# Patient Record
Sex: Male | Born: 1955 | Race: White | Hispanic: No | Marital: Married | Smoking: Former smoker
Health system: Southern US, Community
[De-identification: ages and names within clinical notes are randomized; demographics above are authoritative.]

## PROBLEM LIST (undated history)

## (undated) DIAGNOSIS — G118 Other hereditary ataxias: Secondary | ICD-10-CM

## (undated) DIAGNOSIS — M810 Age-related osteoporosis without current pathological fracture: Secondary | ICD-10-CM

## (undated) DIAGNOSIS — B3781 Candidal esophagitis: Secondary | ICD-10-CM

## (undated) DIAGNOSIS — J189 Pneumonia, unspecified organism: Secondary | ICD-10-CM

## (undated) DIAGNOSIS — N500 Atrophy of testis: Secondary | ICD-10-CM

## (undated) DIAGNOSIS — R1314 Dysphagia, pharyngoesophageal phase: Secondary | ICD-10-CM

## (undated) DIAGNOSIS — R471 Dysarthria and anarthria: Principal | ICD-10-CM

## (undated) DIAGNOSIS — R930 Abnormal findings on diagnostic imaging of skull and head, not elsewhere classified: Secondary | ICD-10-CM

## (undated) DIAGNOSIS — R269 Unspecified abnormalities of gait and mobility: Secondary | ICD-10-CM

## (undated) DIAGNOSIS — I7300 Raynaud's syndrome without gangrene: Secondary | ICD-10-CM

## (undated) DIAGNOSIS — J449 Chronic obstructive pulmonary disease, unspecified: Secondary | ICD-10-CM

## (undated) HISTORY — DX: Age-related osteoporosis without current pathological fracture: M81.0

## (undated) HISTORY — PX: CATARACT EXTRACTION: SUR2

## (undated) HISTORY — DX: Chronic obstructive pulmonary disease, unspecified: J44.9

## (undated) HISTORY — PX: INGUINAL HERNIA REPAIR: SUR1180

## (undated) HISTORY — DX: Atrophy of testis: N50.0

## (undated) HISTORY — DX: Dysarthria and anarthria: R47.1

## (undated) HISTORY — DX: Unspecified abnormalities of gait and mobility: R26.9

## (undated) HISTORY — DX: Abnormal findings on diagnostic imaging of skull and head, not elsewhere classified: R93.0

## (undated) HISTORY — PX: VASECTOMY: SHX75

## (undated) HISTORY — DX: Other hereditary ataxias: G11.8

## (undated) HISTORY — DX: Pneumonia, unspecified organism: J18.9

## (undated) HISTORY — DX: Dysphagia, pharyngoesophageal phase: R13.14

## (undated) HISTORY — PX: NASAL FRACTURE SURGERY: SHX718

## (undated) HISTORY — DX: Raynaud's syndrome without gangrene: I73.00

## (undated) HISTORY — DX: Candidal esophagitis: B37.81

---

## 2001-01-25 ENCOUNTER — Encounter: Admission: RE | Admit: 2001-01-25 | Discharge: 2001-01-25 | Payer: Self-pay | Admitting: Internal Medicine

## 2003-05-12 ENCOUNTER — Encounter: Admission: RE | Admit: 2003-05-12 | Discharge: 2003-05-12 | Payer: Self-pay | Admitting: Urology

## 2003-05-15 ENCOUNTER — Ambulatory Visit (HOSPITAL_BASED_OUTPATIENT_CLINIC_OR_DEPARTMENT_OTHER): Admission: RE | Admit: 2003-05-15 | Discharge: 2003-05-15 | Payer: Self-pay | Admitting: Urology

## 2003-05-15 ENCOUNTER — Ambulatory Visit (HOSPITAL_COMMUNITY): Admission: RE | Admit: 2003-05-15 | Discharge: 2003-05-15 | Payer: Self-pay | Admitting: Urology

## 2003-05-15 ENCOUNTER — Encounter (INDEPENDENT_AMBULATORY_CARE_PROVIDER_SITE_OTHER): Payer: Self-pay | Admitting: Specialist

## 2006-12-07 HISTORY — PX: COLONOSCOPY W/ POLYPECTOMY: SHX1380

## 2007-09-08 ENCOUNTER — Emergency Department (HOSPITAL_COMMUNITY): Admission: EM | Admit: 2007-09-08 | Discharge: 2007-09-08 | Payer: Self-pay | Admitting: Emergency Medicine

## 2009-04-10 DIAGNOSIS — M810 Age-related osteoporosis without current pathological fracture: Secondary | ICD-10-CM

## 2009-04-10 HISTORY — DX: Age-related osteoporosis without current pathological fracture: M81.0

## 2010-03-29 ENCOUNTER — Emergency Department (HOSPITAL_COMMUNITY)
Admission: EM | Admit: 2010-03-29 | Discharge: 2010-03-29 | Payer: Self-pay | Source: Home / Self Care | Admitting: Emergency Medicine

## 2010-08-26 NOTE — Op Note (Signed)
NAME:  Warren [name], Warren [name]                               ACCOUNT NO.:  [id number]   MEDICAL RECORD NO.:  [id number]                   PATIENT TYPE:  AMB   LOCATION:  [facility]                                 FACILITY:  [facility]   PHYSICIAN:  Lindaann Slough, M.D.               DATE OF BIRTH:  1955/04/30   DATE OF PROCEDURE:  05/15/2003  DATE OF DISCHARGE:                                 OPERATIVE REPORT   PREOPERATIVE DIAGNOSES:  Elective sterilization.   POSTOPERATIVE DIAGNOSES:  Elective sterilization.   PROCEDURE:  Bilateral vasectomy.   SURGEON:  Lindaann Slough, M.D. and [name], M.D.   ANESTHESIA:  General.   INDICATIONS FOR PROCEDURE:  The patient is a 55 year old male who was found  on examination by Dr. Lurene Shadow to have bilateral inguinal hernias.  The  patient would like also to have a vasectomy.  He has one child and he feels  that his family is complete.  He was interviewed with his wife and they both  agreed.  He is scheduled today for bilateral inguinal hernia repair by Dr.  Lurene Shadow and bilateral vasectomy.   A right inguinal incision was made by Dr. Lurene Shadow to do the hernia repair and  for specifics see his portion of the dictation.  After the hernia repair was  completed, the vas on the right side was dissected from the vas sheath and a  segment of the vas was excised.  Each end of the vas was then fulgurated and  each end was turned on itself and doubly ligated with #0 Vicryl. The  proximal end of the vas was then covered with the vas sheath.  The incision  was then closed by Dr. Lurene Shadow.   The same procedure was done on the left side.   The patient tolerated the procedure well.                                               Lindaann Slough, M.D.    MN/MEDQ  D:  05/15/2003  T:  05/16/2003  Job:  [id number]   cc:   [name], M.D.  [address]  [address]  [place]  [place] [address]  Fax: [phone]

## 2010-08-26 NOTE — Op Note (Signed)
NAME:  Warren [name], Warren [name]                               ACCOUNT NO.:  [id number]   MEDICAL RECORD NO.:  [id number]                   PATIENT TYPE:  AMB   LOCATION:  [facility]                                 FACILITY:  [facility]   PHYSICIAN:  [name], M.D.                DATE OF BIRTH:  04-25-1955   DATE OF PROCEDURE:  05/16/2003  DATE OF DISCHARGE:                                 OPERATIVE REPORT   PREOPERATIVE DIAGNOSIS:  Bilateral inguinal hernias.   POSTOPERATIVE DIAGNOSIS:  Bilateral inguinal hernias.   PROCEDURE:  Bilateral inguinal hernia repaired with mesh.   SURGEON:  Mardene Celeste. Lurene Shadow, M.D.   CONSULTANT:  Dr. Brunilda Payor.   ANESTHESIA:  General.   INDICATIONS FOR PROCEDURE:  Note, Warren [name] is a 55 year old man, who had a  subdiagnosis of a right inguinal hernia.  On evaluation, noted to have  bilateral inguinal hernias at the time of preparation for operation.  He  also indicated that he wished elective sterilization by vasectomy.  Subsequently consulted on by Dr. Brunilda Payor, who consented to do this procedure at  the same time as his bilateral inguinal hernias.   DESCRIPTION OF PROCEDURE:  Following the induction of satisfactory general  anesthesia with the patient positioned supinely, the lower abdomen is  prepped with Hibiclens solution due to the patient's previously noted IODINE  allergy.  A symmetrical transverse incision in the lower abdominal crease  were outlined with blue ink.  I began on the right side, infiltrating the  subcutaneous tissue with 0.5% Marcaine with epinephrine.  A transverse  incision is made in the lower abdominal crease, deepened through the skin  and subcutaneous tissue and carried down to the external oblique  aponeurosis.  The external oblique aponeurosis was opened up through the  external inguinal ring with protection of the ilioinguinal nerve.  The  spermatic cord was elevated and held with a Penrose drain.  Dissection along  the anterior and medial  aspect of the spermatic cord did not reveal any  evidence of an indirect sac.  There was, however, a fairly large direct  inguinal hernia which was dissected free and then repaired with an onlay  patch of polypropylene mesh which was sewn in from the pubic tubercle with 2-  0 Novofil suture up along the conjoined tendon to the internal ring and  again from the pubic tubercle up along the shelving edge of Cooper's  ligament to the internal ring.  The mesh was split so as to allow the cord  to pass through it.  The tails of the mesh were then trimmed and sutured  down behind the spermatic cord.  At that point, Dr. Brunilda Payor went ahead and  performed a vasectomy on the right side which will be dictated in a separate  operative note.  The spermatic cord was then returned to its normal anatomic  position.  The external  oblique aponeurosis closed over the cord with a  running 2-0 Vicryl suture.  Scarpa's fascia closed with a running 3-0 Vicryl  suture and the skin closed with a running 4-0 Monocryl suture.   Attention was then turned to the left side where a symmetrically placed  lower groin incision was made, deepened, through the skin and subcutaneous  tissues, carried down to the external oblique aponeurosis.  The external  oblique aponeurosis was opened down through the external inguinal ring with  protection of the ilioinguinal nerve. Spermatic cord was again elevated,  held with a Penrose drain.  There was no evidence of an indirect hernia with  dissection of the spermatic cord.  There was a somewhat smaller but definite  left-sided inguinal hernia repaired with an onlay patch of polypropylene  mesh, sewn in with 2-0 Novofil suture and carried up below the conjoined  tendons at the internal ring and again from pubic tubercle up to the  internal ring, sewing the mesh to the shelving edge of Cooper's ligament.  The mesh was appropriately split so as to allow the protrusion of the cord  between  the leaflets of the mesh.  The mesh was then trimmed and sutured  down behind the spermatic cord.  Dr. Brunilda Payor performed a vasectomy on the left  side as well.  The external oblique aponeurosis was closed with a running 2-  0 Vicryl suture.  Scarpa's fascia was closed with a running 3-0 Vicryl  suture, and the skin was closed with a 4-0 Monocryl suture.  Sponge,  instrument, and sharp counts were verified, and the wound got reinforced  with Steri-Strips, and sterile dressings were applied.  The anesthetic was  reversed, the patient removed from the operating room to the recovery room  in stable condition.  He tolerated the procedure well.                                               [name], M.D.    PB/MEDQ  D:  05/16/2003  T:  05/16/2003  Job:  [id number]   cc:   Lindaann Slough, M.D.  [address]  [place]  [place] [address]  Fax: [phone]   Dr. Lurene Shadow (2 copies)

## 2010-12-20 ENCOUNTER — Encounter: Payer: Self-pay | Admitting: Emergency Medicine

## 2010-12-21 ENCOUNTER — Ambulatory Visit (INDEPENDENT_AMBULATORY_CARE_PROVIDER_SITE_OTHER): Payer: BC Managed Care – PPO | Admitting: Emergency Medicine

## 2010-12-21 ENCOUNTER — Encounter: Payer: Self-pay | Admitting: Emergency Medicine

## 2010-12-21 ENCOUNTER — Ambulatory Visit (INDEPENDENT_AMBULATORY_CARE_PROVIDER_SITE_OTHER)
Admission: RE | Admit: 2010-12-21 | Discharge: 2010-12-21 | Disposition: A | Discharge status: Home / Self Care | Payer: BC Managed Care – PPO | Source: Ambulatory Visit | Attending: Emergency Medicine | Admitting: Emergency Medicine

## 2010-12-21 DIAGNOSIS — F17200 Nicotine dependence, unspecified, uncomplicated: Secondary | ICD-10-CM

## 2010-12-21 DIAGNOSIS — J449 Chronic obstructive pulmonary disease, unspecified: Secondary | ICD-10-CM

## 2010-12-21 DIAGNOSIS — Z720 Tobacco use: Secondary | ICD-10-CM | POA: Insufficient documentation

## 2010-12-21 NOTE — Assessment & Plan Note (Addendum)
-   start symbicort - full pft - continue spiriva - prn SABA - stop qvar - CXR today - walking oximetry - discussed tobacco cessation in detail

## 2010-12-21 NOTE — Patient Instructions (Signed)
Please continue you Spiriva Use ProAir 2 puffs as needed Stop QVAR Start Symbicort 2 puffs twice a day (on a schedule) CXR today We will perform full breathing tests at your next visit We talked about stopping smoking - we will discuss a possible quit date next visit Walking oximetry today Follow up with Dr Delton Coombes in 1 month with PFT

## 2010-12-21 NOTE — Progress Notes (Signed)
Subjective:    Patient ID: Warren [name], male    DOB: Feb 28, 1956, 55 y.o.   MRN: [id number]  HPI 55 yo smoker, hx of allergic rhinitis and COPD dx and followed by Dr Hal Hope. Started on Spiriva about a yr ago, he believed this helped some. He has significant exertional dyspnea, also bothered with heat and humidity. He hears occas wheeze, especially in the am. He used to have a lot of cough, this has been better since he started on omeprazole.  He uses ProAir about once a week. He had QVAR but wasn't using on a schedule. He started wellbutrin about 6 months ago. Hasn't set a quit date yet.    Review of Systems  Constitutional: Positive for appetite change. Negative for fever, activity change and fatigue.  HENT: Positive for congestion. Negative for rhinorrhea, sneezing, postnasal drip and sinus pressure.   Eyes: Negative.   Respiratory: Positive for shortness of breath and wheezing. Negative for cough (baseline, in the am - prod clear/white), chest tightness and stridor.   Cardiovascular: Negative.  Negative for chest pain.  Gastrointestinal:       Heartburn Indigestion   Genitourinary: Negative.   Musculoskeletal: Negative.  Negative for back pain.  Skin: Negative.   Neurological: Negative.   Hematological: Negative.   Psychiatric/Behavioral: Negative.     Past Medical History  Diagnosis Date  . Allergic rhinitis      Family History  Problem Relation Age of Onset  . Heart disease Father      History   Social History  . Marital Status: Married    Spouse Name: N/A    Number of Children: N/A  . Years of Education: N/A   Occupational History  . 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 Smoking status: Current Everyday Smoker -- 1.5 packs/day for 30 years  . Smokeless tobacco: Not on file   Comment: down to 3 cigarettes daily  . Alcohol Use: Yes     2 beers monthly  . Drug Use: No  . Sexually Active: Not on file   Other Topics Concern   . Not on file   Social History Narrative  . No narrative on file     Allergies  Allergen Reactions  . Iodine      Outpatient Prescriptions Prior to Visit  Medication Sig Dispense Refill  . beclomethasone (QVAR) 80 MCG/ACT inhaler Inhale 2 puffs into the lungs 2 (two) times daily as needed.       . Tamsulosin HCl (FLOMAX) 0.4 MG CAPS Take 0.4 mg by mouth daily.        Marland Kitchen tiotropium (SPIRIVA) 18 MCG inhalation capsule Place 18 mcg into inhaler and inhale daily.        Marland Kitchen alendronate (FOSAMAX) 70 MG tablet Take 70 mg by mouth every 7 (seven) days. Take with a full glass of water on an empty stomach.       . ergocalciferol (VITAMIN D2) 50000 UNITS capsule Take 50,000 Units by mouth once a week.               Objective:   Physical Exam  Gen: Pleasant, very thin man, in no distress,  normal affect  ENT: No lesions,  mouth clear,  oropharynx clear, no postnasal drip  Neck: No JVD, no TMG, no carotid bruits  Lungs: No use of accessory muscles, no wheeze during normal breath or on forced expiration  Cardiovascular: RRR, heart sounds normal,  no murmur or gallops, no peripheral edema  Musculoskeletal: No deformities, no cyanosis or clubbing  Neuro: alert, non focal  Skin: Warm, no lesions or rashes     Assessment & Plan:  COPD (chronic obstructive pulmonary disease) - start symbicort - full pft - continue spiriva - prn SABA - stop qvar - CXR today

## 2010-12-23 ENCOUNTER — Telehealth: Payer: Self-pay | Admitting: Emergency Medicine

## 2010-12-23 NOTE — Telephone Encounter (Signed)
Pt aware CXR showed emphysema per Dr. Delton Coombes and they will discuss this in detail at his next Ov. Pt verbalized understanding of this.

## 2011-01-04 LAB — URINE MICROSCOPIC-ADD ON

## 2011-01-04 LAB — BASIC METABOLIC PANEL
BUN: 8
Calcium: 9
Creatinine, Ser: 0.81
GFR calc non Af Amer: >60
Potassium: 4

## 2011-01-04 LAB — CBC
Platelets: 211
WBC: 9.3

## 2011-01-04 LAB — URINALYSIS, ROUTINE W REFLEX MICROSCOPIC
Leukocytes, UA: NEGATIVE
Nitrite: NEGATIVE
Specific Gravity, Urine: 1.01
Urobilinogen, UA: 0.2

## 2011-01-04 LAB — DIFFERENTIAL
Basophils Absolute: 0
Lymphocytes Relative: 21
Lymphs Abs: 1.9
Neutrophils Relative %: 68

## 2011-02-06 ENCOUNTER — Encounter: Payer: Self-pay | Admitting: Emergency Medicine

## 2011-02-06 ENCOUNTER — Ambulatory Visit (INDEPENDENT_AMBULATORY_CARE_PROVIDER_SITE_OTHER): Payer: BC Managed Care – PPO | Admitting: Emergency Medicine

## 2011-02-06 VITALS — BP 100/64 | HR 95 | Temp 98.1°F | Ht 68.0 in | Wt 104.0 lb

## 2011-02-06 DIAGNOSIS — J449 Chronic obstructive pulmonary disease, unspecified: Secondary | ICD-10-CM

## 2011-02-06 LAB — PULMONARY FUNCTION TEST

## 2011-02-06 MED ORDER — BUDESONIDE-FORMOTEROL FUMARATE 160-4.5 MCG/ACT IN AERO: 2.0000 | INHALATION_SPRAY | Freq: Two times a day (BID) | RESPIRATORY_TRACT | Status: DC

## 2011-02-06 NOTE — Progress Notes (Signed)
  Subjective:    Patient ID: Warren [name], male    DOB: Jul 21, 1955, 55 y.o.   MRN: [id number] HPI 55 yo smoker, hx of allergic rhinitis and COPD dx and followed by Dr Hal Hope. Started on Spiriva about a yr ago, he believed this helped some. He has significant exertional dyspnea, also bothered with heat and humidity. He hears occas wheeze, especially in the am. He used to have a lot of cough, this has been better since he started on omeprazole.  He uses ProAir about once a week. He had QVAR but wasn't using on a schedule. He started wellbutrin about 6 months ago. Hasn't set a quit date yet.   ROV 02/06/11 -- follow up for COPD, tobacco use. Started Symbicort last time in addition to Spiriva. He felt much better on the Symbicort - significantly decreased the frequency with which he needed SABA. No CP, little cough, minimal mucous. 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   PULMONARY FUNCTON TEST 02/06/2011  FVC 3.15  FEV1 1.25  FEV1/FVC 39.7  FVC  % Predicted 77  FEV % Predicted 42  FeF 25-75 0.43  FeF 25-75 % Predicted 3.05    Review of Systems  Constitutional: Positive for appetite change. Negative for fever, activity change and fatigue.  HENT: Positive for congestion. Negative for rhinorrhea, sneezing, postnasal drip and sinus pressure.   Eyes: Negative.   Respiratory: Positive for shortness of breath and wheezing. Negative for cough (baseline, in the am - prod clear/white), chest tightness and stridor.   Cardiovascular: Negative.  Negative for chest pain.  Gastrointestinal:       Heartburn Indigestion      Objective:  Physical Exam  Gen: Pleasant, very thin man, in no distress,  normal affect  ENT: No lesions,  mouth clear,  oropharynx clear, no postnasal drip  Neck: No JVD, no TMG, no carotid bruits  Lungs: No use of accessory muscles, no wheeze during normal breath or on forced expiration  Cardiovascular: RRR, heart  sounds normal, no murmur or gallops, no peripheral edema  Musculoskeletal: No deformities, no cyanosis or clubbing  Neuro: alert, non focal  Skin: Warm, no lesions or rashes     Assessment & Plan:  COPD (chronic obstructive pulmonary disease) - Spiriva + Symbicort - discussed tobacco cessation in detail, need to set a quit date.  - rov 3 months to discuss.  - continue wellbutrin

## 2011-02-06 NOTE — Progress Notes (Signed)
PFT done today. 

## 2011-02-06 NOTE — Assessment & Plan Note (Addendum)
-   Spiriva + Symbicort - discussed tobacco cessation in detail, need to set a quit date.  - rov 3 months to discuss.  - continue wellbutrin

## 2011-02-06 NOTE — Patient Instructions (Signed)
Continue Spiriva daily Start Symbicort 2 puffs twice a day Use your rescue inhaler as needed Call our office if you are ready to set a quit date for your cigarettes.  Follow up with Dr Delton Coombes in 3 months or sooner if you have any problems.

## 2011-07-21 ENCOUNTER — Telehealth: Payer: Self-pay | Admitting: Emergency Medicine

## 2011-07-21 MED ORDER — VARENICLINE TARTRATE 1 MG PO TABS: 1.0000 mg | ORAL_TABLET | Freq: Two times a day (BID) | ORAL | Status: AC

## 2011-07-21 MED ORDER — VARENICLINE TARTRATE 0.5 MG X 11 & 1 MG X 42 PO MISC: ORAL | Status: AC

## 2011-07-21 NOTE — Telephone Encounter (Signed)
Sent to pharmacy, please let him know that he is to stop smoking after being on the chantix for 7 days. thanks

## 2011-07-21 NOTE — Telephone Encounter (Signed)
Spoke with pt. He feels ready to quit smoking and is requesting rx for chantix. He states that he has never taken this before in the past, so I advised will need to get approval from [facility]. Please advise, thanks!

## 2011-07-21 NOTE — Telephone Encounter (Signed)
OK to order for him the starter pack and then the maintenance pack w 2 refills with standard instructions. Thanks

## 2011-07-21 NOTE — Telephone Encounter (Signed)
I called the pharm to ensure rxs were received.  Pt aware to stop smoking p 7 days of taking the med.

## 2011-11-06 ENCOUNTER — Telehealth: Payer: Self-pay

## 2011-11-06 MED ORDER — ALBUTEROL SULFATE HFA 108 (90 BASE) MCG/ACT IN AERS: 2.0000 | INHALATION_SPRAY | Freq: Four times a day (QID) | RESPIRATORY_TRACT | Status: DC | PRN

## 2011-11-06 NOTE — Telephone Encounter (Signed)
The pt called to request refill of ProAir Rx.  The patient stated he was told to call by pharmacy who has sent over several requests with no response.  Please call the patient at [phone].

## 2011-11-06 NOTE — Telephone Encounter (Signed)
Rx sent to pharmacy   

## 2011-11-07 ENCOUNTER — Other Ambulatory Visit: Payer: Self-pay | Admitting: Physician Assistant

## 2011-11-07 MED ORDER — ALBUTEROL SULFATE HFA 108 (90 BASE) MCG/ACT IN AERS: 2.0000 | INHALATION_SPRAY | Freq: Four times a day (QID) | RESPIRATORY_TRACT | Status: DC | PRN

## 2011-11-07 NOTE — Telephone Encounter (Signed)
Spoke with male, advised her to tell him to pick up Rx

## 2012-02-15 ENCOUNTER — Other Ambulatory Visit: Payer: Self-pay | Admitting: Emergency Medicine

## 2012-03-05 ENCOUNTER — Other Ambulatory Visit: Payer: Self-pay | Admitting: Physician Assistant

## 2012-03-05 MED ORDER — TIOTROPIUM BROMIDE MONOHYDRATE 18 MCG IN CAPS: 18.0000 ug | ORAL_CAPSULE | Freq: Every day | RESPIRATORY_TRACT | Status: DC

## 2012-04-12 ENCOUNTER — Other Ambulatory Visit: Payer: Self-pay | Admitting: Physician Assistant

## 2012-06-08 ENCOUNTER — Ambulatory Visit (INDEPENDENT_AMBULATORY_CARE_PROVIDER_SITE_OTHER): Payer: BC Managed Care – PPO | Admitting: Emergency Medicine

## 2012-06-08 ENCOUNTER — Other Ambulatory Visit: Payer: Self-pay | Admitting: Emergency Medicine

## 2012-06-08 VITALS — BP 113/76 | HR 80 | Temp 97.3°F | Resp 16 | Ht 65.75 in | Wt 97.2 lb

## 2012-06-08 DIAGNOSIS — J441 Chronic obstructive pulmonary disease with (acute) exacerbation: Secondary | ICD-10-CM

## 2012-06-08 MED ORDER — TAMSULOSIN HCL 0.4 MG PO CAPS: 0.4000 mg | ORAL_CAPSULE | Freq: Every day | ORAL | Status: DC

## 2012-06-08 MED ORDER — CLARITHROMYCIN ER 500 MG PO TB24: 1000.0000 mg | ORAL_TABLET | Freq: Every day | ORAL | Status: DC

## 2012-06-08 NOTE — Patient Instructions (Addendum)
Smoking Cessation Quitting smoking is important to your health and has many advantages. However, it is not always easy to quit since nicotine is a very addictive drug. Often times, people try 3 times or more before being able to quit. This document explains the best ways for you to prepare to quit smoking. Quitting takes hard work and a lot of effort, but you can do it. ADVANTAGES OF QUITTING SMOKING  You will live longer, feel better, and live better.  Your body will feel the impact of quitting smoking almost immediately.  Within 20 minutes, blood pressure decreases. Your pulse returns to its normal level.  After 8 hours, carbon monoxide levels in the blood return to normal. Your oxygen level increases.  After 24 hours, the chance of having a heart attack starts to decrease. Your breath, hair, and body stop smelling like smoke.  After 48 hours, damaged nerve endings begin to recover. Your sense of taste and smell improve.  After 72 hours, the body is virtually free of nicotine. Your bronchial tubes relax and breathing becomes easier.  After 2 to 12 weeks, lungs can hold more air. Exercise becomes easier and circulation improves.  The risk of having a heart attack, stroke, cancer, or lung disease is greatly reduced.  After 1 year, the risk of coronary heart disease is cut in half.  After 5 years, the risk of stroke falls to the same as a nonsmoker.  After 10 years, the risk of lung cancer is cut in half and the risk of other cancers decreases significantly.  After 15 years, the risk of coronary heart disease drops, usually to the level of a nonsmoker.  If you are pregnant, quitting smoking will improve your chances of having a healthy baby.  The people you live with, especially any children, will be healthier.  You will have extra money to spend on things other than cigarettes. QUESTIONS TO THINK ABOUT BEFORE ATTEMPTING TO QUIT You may want to talk about your answers with your  caregiver.  Why do you want to quit?  If you tried to quit in the past, what helped and what did not?  What will be the most difficult situations for you after you quit? How will you plan to handle them?  Who can help you through the tough times? Your family? Friends? A caregiver?  What pleasures do you get from smoking? What ways can you still get pleasure if you quit? Here are some questions to ask your caregiver:  How can you help me to be successful at quitting?  What medicine do you think would be best for me and how should I take it?  What should I do if I need more help?  What is smoking withdrawal like? How can I get information on withdrawal? GET READY  Set a quit date.  Change your environment by getting rid of all cigarettes, ashtrays, matches, and lighters in your home, car, or work. Do not let people smoke in your home.  Review your past attempts to quit. Think about what worked and what did not. GET SUPPORT AND ENCOURAGEMENT You have a better chance of being successful if you have help. You can get support in many ways.  Tell your family, friends, and co-workers that you are going to quit and need their support. Ask them not to smoke around you.  Get individual, group, or telephone counseling and support. Programs are available at local hospitals and health centers. Call your local health department for   information about programs in your area.  Spiritual beliefs and practices may help some smokers quit.  Download a "quit meter" on your computer to keep track of quit statistics, such as how long you have gone without smoking, cigarettes not smoked, and money saved.  Get a self-help book about quitting smoking and staying off of tobacco. LEARN NEW SKILLS AND BEHAVIORS  Distract yourself from urges to smoke. Talk to someone, go for a walk, or occupy your time with a task.  Change your normal routine. Take a different route to work. Drink tea instead of coffee.  Eat breakfast in a different place.  Reduce your stress. Take a hot bath, exercise, or read a book.  Plan something enjoyable to do every day. Reward yourself for not smoking.  Explore interactive web-based programs that specialize in helping you quit. GET MEDICINE AND USE IT CORRECTLY Medicines can help you stop smoking and decrease the urge to smoke. Combining medicine with the above behavioral methods and support can greatly increase your chances of successfully quitting smoking.  Nicotine replacement therapy helps deliver nicotine to your body without the negative effects and risks of smoking. Nicotine replacement therapy includes nicotine gum, lozenges, inhalers, nasal sprays, and skin patches. Some may be available over-the-counter and others require a prescription.  Antidepressant medicine helps people abstain from smoking, but how this works is unknown. This medicine is available by prescription.  Nicotinic receptor partial agonist medicine simulates the effect of nicotine in your brain. This medicine is available by prescription. Ask your caregiver for advice about which medicines to use and how to use them based on your health history. Your caregiver will tell you what side effects to look out for if you choose to be on a medicine or therapy. Carefully read the information on the package. Do not use any other product containing nicotine while using a nicotine replacement product.  RELAPSE OR DIFFICULT SITUATIONS Most relapses occur within the first 3 months after quitting. Do not be discouraged if you start smoking again. Remember, most people try several times before finally quitting. You may have symptoms of withdrawal because your body is used to nicotine. You may crave cigarettes, be irritable, feel very hungry, cough often, get headaches, or have difficulty concentrating. The withdrawal symptoms are only temporary. They are strongest when you first quit, but they will go away within  10 14 days. To reduce the chances of relapse, try to:  Avoid drinking alcohol. Drinking lowers your chances of successfully quitting.  Reduce the amount of caffeine you consume. Once you quit smoking, the amount of caffeine in your body increases and can give you symptoms, such as a rapid heartbeat, sweating, and anxiety.  Avoid smokers because they can make you want to smoke.  Do not let weight gain distract you. Many smokers will gain weight when they quit, usually less than 10 pounds. Eat a healthy diet and stay active. You can always lose the weight gained after you quit.  Find ways to improve your mood other than smoking. FOR MORE INFORMATION  www.smokefree.gov  Document Released: 03/21/2001 Document Revised: 09/26/2011 Document Reviewed: 07/06/2011 [employer] Patient Information 2013 [employer].  

## 2012-06-08 NOTE — Progress Notes (Signed)
[facility] [address] [place] [address] [phone]  Date:  06/08/2012   Name:  Warren [name]   DOB:  Jun 06, 1955   MRN:  [id number]  PCP:  Dois Davenport., MD    Chief Complaint: Cough   History of Present Illness:  Warren [name] is a 57 y.o. very pleasant male patient who presents with the following:  Cough for past several days productive purulent sputum.  Increased shortness of breath with minimal exertion.  No nausea or vomiting.  No fever or chills.  No nasal congestion.  Continues to smoke.  Not interested in stopping.  No chest pain or peripheral edema.  Stopped spiriva for months due to urinary retention, likely caused by stopping his flomax  Patient Active Problem List  Diagnosis  . COPD (chronic obstructive pulmonary disease)  . Tobacco abuse    Past Medical History  Diagnosis Date  . Allergic rhinitis   . COPD (chronic obstructive pulmonary disease)     Past Surgical History  Procedure Laterality Date  . Inguinal hernia repair      bilateral  . Nasal fracture surgery      History  Substance Use Topics  . Smoking status: Current Every Day Smoker -- 1.50 packs/day for 30 years  . Smokeless tobacco: Not on file     Comment: pt says he is smoking 5 or 6 cigarettes daily  . Alcohol Use: Yes     Comment: 2 beers monthly    Family History  Problem Relation Age of Onset  . Heart disease Father     Allergies  Allergen Reactions  . Iodine     Medication list has been reviewed and updated.  Current Outpatient Prescriptions on File Prior to Visit  Medication Sig Dispense Refill  . albuterol (PROAIR HFA) 108 (90 BASE) MCG/ACT inhaler Inhale 2 puffs into the lungs every 6 (six) hours as needed. Needs office visit or may request RFs from pulmonologist  1 Inhaler  0  . SPIRIVA HANDIHALER 18 MCG inhalation capsule PLACE 1 CAPSULE (18 MCG TOTAL) INTO INHALER AND INHALE DAILY.  30 each  1  . SYMBICORT 160-4.5 MCG/ACT inhaler INHALE 2 PUFFS  BY MOUTH TWICE A DAY  1 Inhaler  2  . buPROPion (WELLBUTRIN XL) 300 MG 24 hr tablet Once daily      . Calcium-Vitamin D-Vitamin K (CALCIUM SOFT CHEWS PO) 2 chews daily       . fluticasone (FLONASE) 50 MCG/ACT nasal spray 2 sprays in each nostril daily as needed      . Multiple Vitamin (MULTIVITAMIN) tablet Take 1 tablet by mouth daily.        Marland Kitchen omeprazole (PRILOSEC OTC) 20 MG tablet Take 20 mg by mouth daily.        . Tamsulosin HCl (FLOMAX) 0.4 MG CAPS Take 0.4 mg by mouth daily.         No current facility-administered medications on file prior to visit.    Review of Systems:  As per HPI, otherwise negative.    Physical Examination: Filed Vitals:   06/08/12 1333  BP: 113/76  Pulse: 80  Temp: 97.3 F (36.3 C)  Resp: 16   Filed Vitals:   06/08/12 1333  Height: 5' 5.75" (1.67 m)  Weight: 97 lb 3.2 oz (44.09 kg)   Body mass index is 15.81 kg/(m^2). Ideal Body Weight: Weight in (lb) to have BMI = 25: 153.4  GEN: unusually thin with strong odor of cigarette smoke, NAD,  Non-toxic, A & O x 3 HEENT: Atraumatic, Normocephalic. Neck supple. No masses, No LAD. Ears and Nose: No external deformity. CV: RRR, No M/G/R. No JVD. No thrill. No extra heart sounds. CHEST:  Kyphosis.  Poor air movement PULM: CTA B, no wheezes, crackles, rhonchi. No retractions. No resp. distress. No accessory muscle use. ABD: S, NT, ND, +BS. No rebound. No HSM. EXTR: No c/c/e NEURO Normal gait.  PSYCH: Normally interactive. Conversant. Not depressed or anxious appearing.  Calm demeanor.    Assessment and Plan: Exacerbation of COPD and chronic bronchitis Encouraged to stop smoking biaxin Keep using flomax and spiriva   Carmelina Dane, MD

## 2012-07-02 ENCOUNTER — Other Ambulatory Visit: Payer: Self-pay | Admitting: Physician Assistant

## 2012-07-12 ENCOUNTER — Other Ambulatory Visit: Payer: Self-pay | Admitting: Emergency Medicine

## 2012-07-28 ENCOUNTER — Ambulatory Visit (INDEPENDENT_AMBULATORY_CARE_PROVIDER_SITE_OTHER): Payer: BC Managed Care – PPO | Admitting: Family Medicine

## 2012-07-28 ENCOUNTER — Ambulatory Visit: Payer: BC Managed Care – PPO

## 2012-07-28 VITALS — BP 111/77 | HR 80 | Temp 97.5°F | Resp 18 | Wt 100.0 lb

## 2012-07-28 DIAGNOSIS — N400 Enlarged prostate without lower urinary tract symptoms: Secondary | ICD-10-CM

## 2012-07-28 DIAGNOSIS — R634 Abnormal weight loss: Secondary | ICD-10-CM

## 2012-07-28 DIAGNOSIS — L259 Unspecified contact dermatitis, unspecified cause: Secondary | ICD-10-CM

## 2012-07-28 DIAGNOSIS — J309 Allergic rhinitis, unspecified: Secondary | ICD-10-CM

## 2012-07-28 DIAGNOSIS — R0602 Shortness of breath: Secondary | ICD-10-CM

## 2012-07-28 DIAGNOSIS — J441 Chronic obstructive pulmonary disease with (acute) exacerbation: Secondary | ICD-10-CM

## 2012-07-28 DIAGNOSIS — J069 Acute upper respiratory infection, unspecified: Secondary | ICD-10-CM

## 2012-07-28 LAB — POCT UA - MICROSCOPIC ONLY: Crystals, Ur, HPF, POC: NEGATIVE

## 2012-07-28 LAB — POCT CBC
Granulocyte percent: 69.2 %G (ref 37–80)
HCT, POC: 40.4 % — AB (ref 43.5–53.7)
MCV: 101.7 fL — AB (ref 80–97)
MID (cbc): 0.7 (ref 0–0.9)
POC Granulocyte: 5.7 (ref 2–6.9)
Platelet Count, POC: 284 10*3/uL (ref 142–424)
RBC: 3.97 M/uL — AB (ref 4.69–6.13)

## 2012-07-28 LAB — POCT URINALYSIS DIPSTICK
Bilirubin, UA: NEGATIVE
Ketones, UA: NEGATIVE
Leukocytes, UA: NEGATIVE
Protein, UA: NEGATIVE
Spec Grav, UA: 1.01

## 2012-07-28 LAB — COMPREHENSIVE METABOLIC PANEL
ALT: 12 U/L (ref 0–53)
AST: 15 U/L (ref 0–37)
Albumin: 4.2 g/dL (ref 3.5–5.2)
CO2: 28 mEq/L (ref 19–32)
Calcium: 9 mg/dL (ref 8.4–10.5)
Chloride: 99 mEq/L (ref 96–112)
Creat: 0.76 mg/dL (ref 0.50–1.35)
Potassium: 4.2 mEq/L (ref 3.5–5.3)
Sodium: 133 mEq/L — ABNORMAL LOW (ref 135–145)
Total Protein: 6.8 g/dL (ref 6.0–8.3)

## 2012-07-28 LAB — POCT SEDIMENTATION RATE: POCT SED RATE: 20 mm/hr (ref 0–22)

## 2012-07-28 LAB — TSH: TSH: 1.146 u[IU]/mL (ref 0.350–4.500)

## 2012-07-28 MED ORDER — TRIAMCINOLONE ACETONIDE 0.1 % EX CREA: TOPICAL_CREAM | Freq: Two times a day (BID) | CUTANEOUS | Status: DC

## 2012-07-28 MED ORDER — ALBUTEROL SULFATE (2.5 MG/3ML) 0.083% IN NEBU: 2.5000 mg | INHALATION_SOLUTION | Freq: Once | RESPIRATORY_TRACT | Status: DC

## 2012-07-28 MED ORDER — BUDESONIDE-FORMOTEROL FUMARATE 160-4.5 MCG/ACT IN AERO: 2.0000 | INHALATION_SPRAY | Freq: Two times a day (BID) | RESPIRATORY_TRACT | Status: DC

## 2012-07-28 MED ORDER — TAMSULOSIN HCL 0.4 MG PO CAPS: 0.4000 mg | ORAL_CAPSULE | Freq: Every day | ORAL | Status: DC

## 2012-07-28 MED ORDER — PREDNISONE 20 MG PO TABS: ORAL_TABLET | ORAL | Status: DC

## 2012-07-28 MED ORDER — ALBUTEROL SULFATE HFA 108 (90 BASE) MCG/ACT IN AERS: 2.0000 | INHALATION_SPRAY | Freq: Four times a day (QID) | RESPIRATORY_TRACT | Status: DC | PRN

## 2012-07-28 MED ORDER — TIOTROPIUM BROMIDE MONOHYDRATE 18 MCG IN CAPS: 18.0000 ug | ORAL_CAPSULE | Freq: Every day | RESPIRATORY_TRACT | Status: DC

## 2012-07-28 MED ORDER — CEFDINIR 300 MG PO CAPS: 600.0000 mg | ORAL_CAPSULE | Freq: Every day | ORAL | Status: DC

## 2012-07-28 MED ORDER — FLUTICASONE PROPIONATE 50 MCG/ACT NA SUSP: 2.0000 | Freq: Every day | NASAL | Status: DC

## 2012-07-28 NOTE — Progress Notes (Signed)
[address]   [place], [place]  [address]   [phone]  Subjective:    Patient ID: Warren [name], male    DOB: 02-10-1956, 57 y.o.   MRN: [id number]  HPI This 57 y.o. male presents for evaluation of upper respiratory symptoms, rash.  1.  SOB:  Out of Symbicort for past one week.  Onset of SOB in past week.  Had chest infection one month ago; rx for antibiotic with rapid improvement but after three days, cold never really improved.  No fever but +chronic chills.  No sweats.  Mild headache.  +ST mild with hoarseness for past week.  +nasal congestion for several weeks; drainage is yellow.  +PND.  +coughing; +sputum yellow for two weeks.  SOB can vary.  Going from car to house  Is horrible.  Taking Spiriva.  Out of Albuterol currently.  No v/d.  No chest pain; some chest tightness with SOB.  Has allergies; Claritin dries pt out.   Stomach pain; feels heartbeat in stomach.     2. Rash:  B forearms; almost resolved.  Duration two months.  +itching.  +excoration; no change in soaps, detergents, fabric softeners.   Maybe shoulder involvement.  [employer]; truck driver.    3. Weight loss: ten pound weight loss in past year.  Unintentional. Colonoscopy UTD. Last physical by Hal Hope; date unknown.    4. BPH:  Nocturia x 1-2; without Flomax, nocturia x multiple.  No straining; does not need to sit down.  Weaker stream.    5.  COPD:  Worsening due to running out of Symbicort one week ago.  No recent follow-up with pulmonology.  Continues to smoke 1-1.5 ppd; has tried Chantix twice in past without success.  Not ready to quit.     PCP:  [facility].     Review of Systems  Constitutional: Positive for chills and unexpected weight change. Negative for fever, diaphoresis, activity change, appetite change and fatigue.  HENT: Positive for congestion, sore throat, rhinorrhea, sneezing, trouble swallowing, voice change and postnasal drip. Negative for ear pain and sinus pressure.   Respiratory: Positive  for cough, shortness of breath and wheezing. Negative for apnea and chest tightness.   Cardiovascular: Negative for chest pain, palpitations and leg swelling.  Gastrointestinal: Negative for nausea, vomiting and diarrhea.  Endocrine: Negative for cold intolerance, heat intolerance, polydipsia, polyphagia and polyuria.  Genitourinary: Positive for decreased urine volume and difficulty urinating. Negative for urgency, frequency and hematuria.  Skin: Positive for rash.  Allergic/Immunologic: Positive for environmental allergies.        Past Medical History  Diagnosis Date  . Allergic rhinitis   . COPD (chronic obstructive pulmonary disease)     Past Surgical History  Procedure Laterality Date  . Inguinal hernia repair      bilateral  . Nasal fracture surgery      Prior to Admission medications   Medication Sig Start Date End Date Taking? Authorizing Provider  Multiple Vitamin (MULTIVITAMIN) tablet Take 1 tablet by mouth daily.     Yes Historical Provider, MD  tamsulosin (FLOMAX) 0.4 MG CAPS Take 1 capsule (0.4 mg total) by mouth daily. 06/08/12  Yes Phillips Odor, MD  albuterol (PROAIR HFA) 108 (90 BASE) MCG/ACT inhaler Inhale 2 puffs into the lungs every 6 (six) hours as needed. Needs office visit or may request RFs from pulmonologist 11/07/11   Anders Simmonds, PA-C  Calcium-Vitamin D-Vitamin K (CALCIUM SOFT CHEWS PO) 2 chews daily     Historical  Provider, MD  fluticasone Aleda Grana) 50 MCG/ACT nasal spray 2 sprays in each nostril daily as needed 09/14/10   Historical Provider, MD  SPIRIVA HANDIHALER 18 MCG inhalation capsule INHALE 1 CAPSULE BY MOUTH EVERY DAY 07/02/12   Phillips Odor, MD  SYMBICORT 160-4.5 MCG/ACT inhaler INHALE 2 PUFFS BY MOUTH TWICE A DAY 06/08/12   Leslye Peer, MD    Allergies  Allergen Reactions  . Iodine     History   Social History  . Marital Status: Married    Spouse Name: N/A    Number of Children: N/A  . Years of Education: N/A   Occupational  History  . 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 Smoking status: Current Every Day Smoker -- 1.50 packs/day for 30 years  . Smokeless tobacco: Not on file     Comment: pt says he is smoking 5 or 6 cigarettes daily  . Alcohol Use: Yes     Comment: 2 beers monthly  . Drug Use: No  . Sexually Active: Not on file   Other Topics Concern  . Not on file   Social History Narrative   Marital status: married      Children:  One; no grandchildren.      Lives: with wife, son      Employment: truck driver; [employer].  Drives locally     Tobacco: 1 ppd x 30 years      Alcohol:  Rare      Drugs: none    Family History  Problem Relation Age of Onset  . Heart disease Father     valve replacement; CHF; heart transplant candidate  . Hypertension Mother   . Hyperlipidemia Mother   . Diabetes Mother   . Diabetes Sister   . Hyperlipidemia Sister   . Stroke Brother     Objective:   Physical Exam  Nursing note and vitals reviewed. Constitutional: He is oriented to person, place, and time. He appears well-developed and well-nourished. No distress.  HENT:  Head: Normocephalic and atraumatic.  Right Ear: External ear normal.  Left Ear: External ear normal.  Nose: Nose normal.  Mouth/Throat: Posterior oropharyngeal erythema present. No oropharyngeal exudate.  Eyes: Conjunctivae and EOM are normal. Pupils are equal, round, and reactive to light.  Neck: Normal range of motion. Neck supple. No thyromegaly present.  Cardiovascular: Normal rate, regular rhythm and normal heart sounds.  Exam reveals no gallop and no friction rub.   No murmur heard. Pulmonary/Chest: He has no wheezes. He has no rales.  Distant breath sounds throughout.  No tachypnea; speaking complete sentences; no retractions.  Lymphadenopathy:    He has no cervical adenopathy.  Neurological: He is alert and oriented to person, place, and time.  Skin: Skin is warm and dry. Rash noted. He  is not diaphoretic. There is erythema.  B forearms with diffuse dry scaling with maculopapular scattered rash B.  No vesicles or pustules.  Psychiatric: He has a normal mood and affect. His behavior is normal. Judgment and thought content normal.   Results for orders placed in visit on 07/28/12  POCT CBC      Result Value Range   WBC 8.2  4.6 - 10.2 K/uL   Lymph, poc 1.8  0.6 - 3.4   POC LYMPH PERCENT 22.0  10 - 50 %L   MID (cbc) 0.7  0 - 0.9   POC MID % 8.8  0 - 12 %  M   POC Granulocyte 5.7  2 - 6.9   Granulocyte percent 69.2  37 - 80 %G   RBC 3.97 (*) 4.69 - 6.13 M/uL   Hemoglobin 12.7 (*) 14.1 - 18.1 g/dL   HCT, POC 95.2 (*) 84.1 - 53.7 %   MCV 101.7 (*) 80 - 97 fL   MCH, POC 32.0 (*) 27 - 31.2 pg   MCHC 31.4 (*) 31.8 - 35.4 g/dL   RDW, POC 32.4     Platelet Count, POC 284  142 - 424 K/uL   MPV 7.5  0 - 99.8 fL  POCT UA - MICROSCOPIC ONLY      Result Value Range   WBC, Ur, HPF, POC 0-1     RBC, urine, microscopic 2-5     Bacteria, U Microscopic trace     Mucus, UA neg     Epithelial cells, urine per micros neg     Crystals, Ur, HPF, POC neg     Casts, Ur, LPF, POC neg     Yeast, UA neg    POCT URINALYSIS DIPSTICK      Result Value Range   Color, UA yellow     Clarity, UA clear     Glucose, UA neg     Bilirubin, UA neg     Ketones, UA neg     Spec Grav, UA 1.010     Blood, UA trace     pH, UA 6.5     Protein, UA neg     Urobilinogen, UA 0.2     Nitrite, UA neg     Leukocytes, UA Negative     [facility] reading (PRIMARY) by  Dr. Katrinka Blazing.  CXR: hyperinflated lungs; NAD.  ALBUTEROL NEBULIZER ADMINISTERED IN OFFICE.  POST AMBULATION PULSE OXIMETRY 92% WITH PULSE 102.      Assessment & Plan:  Shortness of breath - Plan: DG Chest 2 View, albuterol (PROVENTIL) (2.5 MG/3ML) 0.083% nebulizer solution 2.5 mg  COPD exacerbation - Plan: albuterol (PROVENTIL) (2.5 MG/3ML) 0.083% nebulizer solution 2.5 mg  Acute upper respiratory infections of unspecified site - Plan: albuterol  (PROVENTIL) (2.5 MG/3ML) 0.083% nebulizer solution 2.5 mg  Allergic rhinitis  Weight loss, unintentional - Plan: POCT CBC, POCT UA - Microscopic Only, POCT urinalysis dipstick, POCT SEDIMENTATION RATE, Comprehensive metabolic panel, TSH  Contact dermatitis - Plan: triamcinolone cream (KENALOG) 0.1 %  BPH (benign prostatic hyperplasia)   1.  SOB: New. Secondary to non-compliance with Symbicort with acute COPD exacerbation. 2.  COPD with acute exacerbation: New.  Secondary to non-compliance with Symbicort; non-compliance with pulmonology follow-up.  Rx for Prednisone provided; s/p Albuterol neb in office. Refill of Symbicort, Spiriva, Proair.  RTC for acute worsening. 3.  URI/bronchitis/sinusitis:  New/persistent; s/p Biaxin with persistent congestion. Rx for Omnicef provided. 4.  Allergic Rhinitis:  Worsening; rx for Flonase provided. 5.  BPH: controlled; refill of Flomax provided. 6.  Weight loss unintentional:  New.  Obtain labs. Recommend follow-up in next 1-6 months for CPE.  S/p CXR; colonoscopy due; small amount of blood in urine yet chronic per patient. 7. Contact Dermatitis:  New.  B forearms.  Rx for Triamcinolone 0.1% provided.  Meds ordered this encounter  Medications  . albuterol (PROVENTIL) (2.5 MG/3ML) 0.083% nebulizer solution 2.5 mg    Sig:   . cefdinir (OMNICEF) 300 MG capsule    Sig: Take 2 capsules (600 mg total) by mouth daily.    Dispense:  20 capsule    Refill:  0  .  predniSONE (DELTASONE) 20 MG tablet    Sig: Two tablets daily x 5 days then one tablet daily x 5 days    Dispense:  15 tablet    Refill:  0  . tamsulosin (FLOMAX) 0.4 MG CAPS    Sig: Take 1 capsule (0.4 mg total) by mouth daily.    Dispense:  30 capsule    Refill:  12  . fluticasone (FLONASE) 50 MCG/ACT nasal spray    Sig: Place 2 sprays into the nose daily. 2 sprays in each nostril daily as needed    Dispense:  16 g    Refill:  11  . budesonide-formoterol (SYMBICORT) 160-4.5 MCG/ACT inhaler     Sig: Inhale 2 puffs into the lungs 2 (two) times daily.    Dispense:  10.2 g    Refill:  11  . tiotropium (SPIRIVA HANDIHALER) 18 MCG inhalation capsule    Sig: Place 1 capsule (18 mcg total) into inhaler and inhale daily.    Dispense:  30 capsule    Refill:  11  . albuterol (PROAIR HFA) 108 (90 BASE) MCG/ACT inhaler    Sig: Inhale 2 puffs into the lungs every 6 (six) hours as needed.    Dispense:  1 Inhaler    Refill:  11  . triamcinolone cream (KENALOG) 0.1 %    Sig: Apply topically 2 (two) times daily.    Dispense:  45 g    Refill:  0

## 2012-07-28 NOTE — Patient Instructions (Addendum)

## 2012-09-01 ENCOUNTER — Ambulatory Visit (INDEPENDENT_AMBULATORY_CARE_PROVIDER_SITE_OTHER): Payer: BC Managed Care – PPO | Admitting: Family Medicine

## 2012-09-01 ENCOUNTER — Ambulatory Visit: Payer: BC Managed Care – PPO

## 2012-09-01 VITALS — BP 91/50 | HR 123 | Temp 98.1°F | Resp 16 | Ht 67.0 in | Wt 99.2 lb

## 2012-09-01 DIAGNOSIS — IMO0001 Reserved for inherently not codable concepts without codable children: Secondary | ICD-10-CM

## 2012-09-01 DIAGNOSIS — M79609 Pain in unspecified limb: Secondary | ICD-10-CM

## 2012-09-01 DIAGNOSIS — M545 Low back pain, unspecified: Secondary | ICD-10-CM

## 2012-09-01 DIAGNOSIS — M791 Myalgia, unspecified site: Secondary | ICD-10-CM

## 2012-09-01 DIAGNOSIS — M79671 Pain in right foot: Secondary | ICD-10-CM

## 2012-09-01 DIAGNOSIS — R1013 Epigastric pain: Secondary | ICD-10-CM

## 2012-09-01 DIAGNOSIS — R634 Abnormal weight loss: Secondary | ICD-10-CM

## 2012-09-01 DIAGNOSIS — D649 Anemia, unspecified: Secondary | ICD-10-CM

## 2012-09-01 LAB — POCT UA - MICROSCOPIC ONLY
Bacteria, U Microscopic: NEGATIVE
Casts, Ur, LPF, POC: NEGATIVE
Mucus, UA: NEGATIVE

## 2012-09-01 LAB — COMPREHENSIVE METABOLIC PANEL
ALT: 15 U/L (ref 0–53)
AST: 16 U/L (ref 0–37)
CO2: 24 mEq/L (ref 19–32)
Calcium: 8.3 mg/dL — ABNORMAL LOW (ref 8.4–10.5)
Chloride: 93 mEq/L — ABNORMAL LOW (ref 96–112)
Creat: 0.7 mg/dL (ref 0.50–1.35)
Sodium: 128 mEq/L — ABNORMAL LOW (ref 135–145)
Total Bilirubin: 0.3 mg/dL (ref 0.3–1.2)
Total Protein: 6.3 g/dL (ref 6.0–8.3)

## 2012-09-01 LAB — POCT CBC
Granulocyte percent: 85.1 %G — AB (ref 37–80)
HCT, POC: 35.1 % — AB (ref 43.5–53.7)
Hemoglobin: 11.2 g/dL — AB (ref 14.1–18.1)
MCV: 101.1 fL — AB (ref 80–97)
POC Granulocyte: 13.4 — AB (ref 2–6.9)
POC LYMPH PERCENT: 9.8 %L — AB (ref 10–50)
RBC: 3.47 M/uL — AB (ref 4.69–6.13)
RDW, POC: 12.2 %

## 2012-09-01 LAB — POCT URINALYSIS DIPSTICK
Bilirubin, UA: NEGATIVE
Glucose, UA: 100
Spec Grav, UA: 1.015
Urobilinogen, UA: 0.2

## 2012-09-01 LAB — GLUCOSE, POCT (MANUAL RESULT ENTRY): POC Glucose: 149 mg/dl — AB (ref 70–99)

## 2012-09-01 LAB — FOLATE: Folate: 10.1 ng/mL

## 2012-09-01 LAB — POCT SEDIMENTATION RATE: POCT SED RATE: 130 mm/hr — AB (ref 0–22)

## 2012-09-01 LAB — CK: Total CK: 52 U/L (ref 7–232)

## 2012-09-01 LAB — IFOBT (OCCULT BLOOD): IFOBT: NEGATIVE

## 2012-09-01 NOTE — Patient Instructions (Addendum)
RETURN IN 1-2 MONTHS FOR PHYSICAL EXAM, REPEAT HEMOGLOBIN.

## 2012-09-01 NOTE — Progress Notes (Signed)
[address]   [place], [place]  [address]   [phone]  Subjective:    Patient ID: Warren [name], male    DOB: 1956/02/22, 57 y.o.   MRN: [id number]  HPI This 57 y.o. male presents for evaluation of multiple concerns:  1.  R foot pain:  Pain with weight bearing; pain shoots up lateral aspect of ankle and into calf.  No injury.  Just started yesterday.  No icing; no swelling.  No frequent ambulation.  Red mark along feet R.  Pain at bottom of heel.  Truck driver; wears Research scientist (medical).  No history of bone spur of heel.  2.  Muscles ache: legs, arms, back. R humerus > L humerus. Thighs and calves; feels like worked out in gym for hours.  No change in activity level; activity has declined if anything due to malaise.  Has spent past three weekends in bed; that is very unusual.  Not missing work.   3.  Stomach:  Not eating right; can't eat; early satiety; onset a long time ago and just worsening in past month.  Yesterday, ate spaghetti small amount; ate [employer] six inch, spaghetti again for supper small amount.  Weight down one pound in past month. Four years ago, 122.  No evaluation by GI.  S/p colonoscopy a while ago.  No nausea, vomiting, diarrhea, constipation.  +abdominal pain this morning; after belching, pain improved.  Stomach feels funny in mornings; +bloating; no heartburn or indigestion. Daily bowel movement.  4.  Cold:  Stays chilled; must use electric blanket.  Must use electric blanket for several hours.  No fever but chills.    5.  COPD:  Chronic mucous production; no increased amount; no change in color.  SOB still present. Did fill Symbicort after last visit; compliance.  No change in breathing from baseline.  6.  Unsteadiness on feet:  Mother with spinocerebellar ataxia; wants evaluated; unsteady on feet for one year with recent worsening in past month.  No dizziness; mild lightheadedness.  Really bad in the mornings.   Review of Systems  Constitutional: Positive for chills, activity  change, appetite change, fatigue and unexpected weight change. Negative for fever and diaphoresis.  HENT: Negative for congestion, rhinorrhea, sneezing and postnasal drip.   Respiratory: Positive for cough, shortness of breath and wheezing. Negative for stridor.   Cardiovascular: Positive for leg swelling. Negative for chest pain and palpitations.  Gastrointestinal: Positive for abdominal pain and abdominal distention. Negative for nausea, vomiting, diarrhea, constipation, blood in stool, anal bleeding and rectal pain.  Musculoskeletal: Positive for myalgias, back pain and arthralgias. Negative for joint swelling and gait problem.  Skin: Negative for color change, pallor, rash and wound.  Neurological: Positive for weakness. Negative for dizziness, tremors, seizures, syncope, facial asymmetry, speech difficulty, light-headedness, numbness and headaches.    Past Medical History  Diagnosis Date  . Allergic rhinitis   . COPD (chronic obstructive pulmonary disease)   . Osteoporosis     L hip fracture  . Testicular atrophy     Right    Past Surgical History  Procedure Laterality Date  . Nasal fracture surgery    . Vasectomy    . Inguinal hernia repair      bilateral  . Colonoscopy w/ polypectomy  12/07/2006    three polyps sigmoid.  Merleen Milliner. Repeat 3 years.  . Egd  09/24/2012    hiatal hernia; candidal esophagitis, hematin in stomach; no active source of bleeding.  . Colonoscopy w/ polypectomy  09/24/2012  five sessile polyps removed.  Internal and external hemorrhoids.    Prior to Admission medications   Medication Sig Start Date End Date Taking? Authorizing Provider  budesonide-formoterol (SYMBICORT) 160-4.5 MCG/ACT inhaler Inhale 2 puffs into the lungs 2 (two) times daily. 07/28/12  Yes Ethelda Chick, MD  albuterol (PROAIR HFA) 108 (90 BASE) MCG/ACT inhaler Inhale 2 puffs into the lungs every 6 (six) hours as needed. 07/28/12   Ethelda Chick, MD  Calcium-Vitamin D-Vitamin K  (CALCIUM SOFT CHEWS PO) 2 chews daily     Historical Provider, MD  cefdinir (OMNICEF) 300 MG capsule Take 2 capsules (600 mg total) by mouth daily. 07/28/12   Ethelda Chick, MD  fluconazole (DIFLUCAN) 100 MG tablet Take 100 mg by mouth daily.    Historical Provider, MD  fluticasone (FLONASE) 50 MCG/ACT nasal spray Place 2 sprays into the nose daily. 2 sprays in each nostril daily as needed 07/28/12   Ethelda Chick, MD  levofloxacin (LEVAQUIN) 750 MG tablet Take 1 tablet (750 mg total) by mouth daily. 09/08/12   Ethelda Chick, MD  Multiple Vitamin (MULTIVITAMIN) tablet Take 1 tablet by mouth daily.      Historical Provider, MD  omeprazole (PRILOSEC) 40 MG capsule Take 40 mg by mouth daily.    Historical Provider, MD  predniSONE (DELTASONE) 20 MG tablet Two tablets daily x 5 days then one tablet daily x 5 days 07/28/12   Ethelda Chick, MD  tamsulosin (FLOMAX) 0.4 MG CAPS Take 1 capsule (0.4 mg total) by mouth daily. 07/28/12   Ethelda Chick, MD  tiotropium (SPIRIVA HANDIHALER) 18 MCG inhalation capsule Place 1 capsule (18 mcg total) into inhaler and inhale daily. 07/28/12   Ethelda Chick, MD  triamcinolone cream (KENALOG) 0.1 % Apply topically 2 (two) times daily. 07/28/12   Ethelda Chick, MD    Allergies  Allergen Reactions  . Iodine     History   Social History  . Marital Status: Married    Spouse Name: N/A    Number of Children: N/A  . Years of Education: N/A   Occupational History  . 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 Smoking status: Current Every Day Smoker -- 1.50 packs/day for 30 years  . Smokeless tobacco: Not on file     Comment: pt says he is smoking 5 or 6 cigarettes daily  . Alcohol Use: No     Comment: 2 beers monthly  . Drug Use: No  . Sexually Active: Not on file   Other Topics Concern  . Not on file   Social History Narrative   Marital status: married      Children:  One; no grandchildren.      Lives: with wife, son       Employment: truck driver; hauls beer.  Drives locally     Tobacco: 1 ppd x 30 years      Alcohol:  Rare      Drugs: none    Family History  Problem Relation Age of Onset  . Heart disease Father     valve replacement; CHF; heart transplant candidate  . Hypertension Mother   . Hyperlipidemia Mother   . Diabetes Mother   . Diabetes Sister   . Hyperlipidemia Sister   . Stroke Brother        Objective:   Physical Exam  Nursing note and vitals reviewed. Constitutional: He is oriented to  person, place, and time. He appears well-developed. He appears cachectic. No distress.  HENT:  Head: Normocephalic and atraumatic.  Right Ear: External ear normal.  Left Ear: External ear normal.  Nose: Nose normal.  Mouth/Throat: Oropharynx is clear and moist.  Eyes: Conjunctivae and EOM are normal. Pupils are equal, round, and reactive to light.  Neck: Normal range of motion. Neck supple. No thyromegaly present.  Cardiovascular: Normal rate, regular rhythm, normal heart sounds and intact distal pulses.  Exam reveals no gallop and no friction rub.   No murmur heard. Pulmonary/Chest: Effort normal. No respiratory distress. He has no wheezes. He has no rales.  Distant breath sounds throughout.  Abdominal: Soft. Bowel sounds are normal. He exhibits no distension and no mass. There is no tenderness. There is no rebound and no guarding.  Musculoskeletal:       Right foot: He exhibits tenderness and bony tenderness. He exhibits normal range of motion, no swelling, normal capillary refill, no crepitus, no deformity and no laceration.  +ttp R heel; no metatarsal TTP; full ROM R ankle.  Lymphadenopathy:    He has no cervical adenopathy.  Neurological: He is alert and oriented to person, place, and time.  Skin: Skin is warm and dry. No rash noted. He is not diaphoretic. No erythema. No pallor.  Psychiatric: He has a normal mood and affect. His behavior is normal. Judgment and thought content normal.        Results for orders placed in visit on 09/01/12  POCT CBC      Result Value Range   WBC 15.7 (*) 4.6 - 10.2 K/uL   Lymph, poc 1.5  0.6 - 3.4   POC LYMPH PERCENT 9.8 (*) 10 - 50 %L   MID (cbc) 0.8  0 - 0.9   POC MID % 5.1  0 - 12 %M   POC Granulocyte 13.4 (*) 2 - 6.9   Granulocyte percent 85.1 (*) 37 - 80 %G   RBC 3.47 (*) 4.69 - 6.13 M/uL   Hemoglobin 11.2 (*) 14.1 - 18.1 g/dL   HCT, POC 16.1 (*) 09.6 - 53.7 %   MCV 101.1 (*) 80 - 97 fL   MCH, POC 32.3 (*) 27 - 31.2 pg   MCHC 31.9  31.8 - 35.4 g/dL   RDW, POC 04.5     Platelet Count, POC 589 (*) 142 - 424 K/uL   MPV 6.4  0 - 99.8 fL  GLUCOSE, POCT (MANUAL RESULT ENTRY)      Result Value Range   POC Glucose 149 (*) 70 - 99 mg/dl  POCT UA - MICROSCOPIC ONLY      Result Value Range   WBC, Ur, HPF, POC 0-1     RBC, urine, microscopic 4-8     Bacteria, U Microscopic neg     Mucus, UA neg     Epithelial cells, urine per micros 0-1     Crystals, Ur, HPF, POC neg     Casts, Ur, LPF, POC neg     Yeast, UA neg    POCT URINALYSIS DIPSTICK      Result Value Range   Color, UA yellow     Clarity, UA clear     Glucose, UA 100     Bilirubin, UA neg     Ketones, UA neg     Spec Grav, UA 1.015     Blood, UA trace-intacct     pH, UA 7.0     Protein, UA neg  Urobilinogen, UA 0.2     Nitrite, UA neg     Leukocytes, UA Negative     [facility] reading (PRIMARY) by  Dr. Katrinka Blazing.  R FOOT: SMALL CALCANEUS SPUR.  LS SPINE: NAD.   Assessment & Plan:  Heel pain, right - Plan: Comprehensive metabolic panel, DG Foot 2 Views Right  Unintentional weight loss - Plan: POCT CBC, POCT glucose (manual entry), CK, Comprehensive metabolic panel, Ambulatory referral to Gastroenterology  Myalgia - Plan: POCT CBC, POCT glucose (manual entry), Comprehensive metabolic panel, Vitamin B12, Rheumatoid factor, ANA, Folate, DG Lumbar Spine 2-3 Views, POCT SEDIMENTATION RATE  Lower back pain - Plan: Comprehensive metabolic panel, Rheumatoid factor, ANA,  Folate, POCT SEDIMENTATION RATE, POCT UA - Microscopic Only, POCT urinalysis dipstick  Abdominal pain, epigastric - Plan: POCT CBC, CK, Comprehensive metabolic panel, Ambulatory referral to Gastroenterology  Anemia - Plan: Ambulatory referral to Gastroenterology, IFOBT POC (occult bld, rslt in office)    1. Anemia: worsening in past month; obtain hemosure.  Refer to GI for repeat colonoscopy/EGD.  Repeat labs in upcoming 1-2 months.  Associated with early satiety, weight loss.   2.  Unintentional weight loss: continues to slowly worsen; refer to GI for EGD/colonoscopy.  If negative GI work up, will warrant CT chest/abdomen/pelvis.  TSH last month normal.  Weight down one pound in past month but down 25 pounds in past three years. 3.  Myalgias:  New. Onset in past month; obtain ESR, CK.  Consider evaluation by rheumatology if persists.  Close follow-up. 4.  Lower back pain:  New.  Occurring at night; s/p LS spine films negative.  Recommend rest, stretches. Associated with myalgias. 5.  Abdominal pain epigastric:  New.  Mild. Associated with morning bloating.  Pt declined PPI but agreeable to GI referral.  Chronic early satiety for past three years; warrants EGD. 6. R heel pain:  New.  Ddx calcaneus spur versus plantar fasciitis.  Recommend icing bid for two weeks, walking with supportive shoes. 7.  COPD: stable.  No acute worsening.  Breathing at baseline. 8. Leukocytosis:  New.  Asymptomatic currently other than myalgias, GI symptoms.    No orders of the defined types were placed in this encounter.

## 2012-09-04 ENCOUNTER — Encounter: Payer: Self-pay | Admitting: Family Medicine

## 2012-09-07 ENCOUNTER — Telehealth: Payer: Self-pay

## 2012-09-07 NOTE — Telephone Encounter (Signed)
Pt is calling back about his lab results Cal back number is [phone]

## 2012-09-08 ENCOUNTER — Ambulatory Visit (INDEPENDENT_AMBULATORY_CARE_PROVIDER_SITE_OTHER): Payer: BC Managed Care – PPO | Admitting: Family Medicine

## 2012-09-08 ENCOUNTER — Ambulatory Visit: Payer: BC Managed Care – PPO

## 2012-09-08 VITALS — BP 88/58 | HR 100 | Temp 97.3°F | Resp 20 | Ht 66.0 in | Wt 95.4 lb

## 2012-09-08 DIAGNOSIS — J449 Chronic obstructive pulmonary disease, unspecified: Secondary | ICD-10-CM

## 2012-09-08 DIAGNOSIS — M791 Myalgia, unspecified site: Secondary | ICD-10-CM

## 2012-09-08 DIAGNOSIS — R634 Abnormal weight loss: Secondary | ICD-10-CM

## 2012-09-08 DIAGNOSIS — D649 Anemia, unspecified: Secondary | ICD-10-CM

## 2012-09-08 DIAGNOSIS — J189 Pneumonia, unspecified organism: Secondary | ICD-10-CM

## 2012-09-08 DIAGNOSIS — J4489 Other specified chronic obstructive pulmonary disease: Secondary | ICD-10-CM

## 2012-09-08 DIAGNOSIS — M7989 Other specified soft tissue disorders: Secondary | ICD-10-CM

## 2012-09-08 DIAGNOSIS — IMO0001 Reserved for inherently not codable concepts without codable children: Secondary | ICD-10-CM

## 2012-09-08 DIAGNOSIS — E871 Hypo-osmolality and hyponatremia: Secondary | ICD-10-CM

## 2012-09-08 LAB — POCT UA - MICROSCOPIC ONLY
Casts, Ur, LPF, POC: NEGATIVE
Mucus, UA: NEGATIVE

## 2012-09-08 LAB — POCT CBC
Hemoglobin: 10.5 g/dL — AB (ref 14.1–18.1)
Lymph, poc: 1.9 (ref 0.6–3.4)
MCH, POC: 31.5 pg — AB (ref 27–31.2)
MCHC: 31.2 g/dL — AB (ref 31.8–35.4)
MCV: 101.2 fL — AB (ref 80–97)
MID (cbc): 0.7 (ref 0–0.9)
Platelet Count, POC: 549 10*3/uL — AB (ref 142–424)
RBC: 3.33 M/uL — AB (ref 4.69–6.13)
WBC: 11.3 10*3/uL — AB (ref 4.6–10.2)

## 2012-09-08 LAB — POCT URINALYSIS DIPSTICK
Bilirubin, UA: NEGATIVE
Glucose, UA: NEGATIVE
Ketones, UA: NEGATIVE
Leukocytes, UA: NEGATIVE
Nitrite, UA: NEGATIVE

## 2012-09-08 LAB — POCT SEDIMENTATION RATE: POCT SED RATE: 138 mm/hr — AB (ref 0–22)

## 2012-09-08 LAB — COMPREHENSIVE METABOLIC PANEL
ALT: 15 U/L (ref 0–53)
BUN: 5 mg/dL — ABNORMAL LOW (ref 6–23)
CO2: 24 mEq/L (ref 19–32)
Calcium: 8.3 mg/dL — ABNORMAL LOW (ref 8.4–10.5)
Chloride: 95 mEq/L — ABNORMAL LOW (ref 96–112)
Creat: 0.63 mg/dL (ref 0.50–1.35)
Total Bilirubin: 0.4 mg/dL (ref 0.3–1.2)

## 2012-09-08 MED ORDER — LEVOFLOXACIN 750 MG PO TABS: 750.0000 mg | ORAL_TABLET | Freq: Every day | ORAL | Status: DC

## 2012-09-08 NOTE — Telephone Encounter (Signed)
See labs 

## 2012-09-08 NOTE — Progress Notes (Signed)
[address]   [place], [place]  [address]   [phone]  Subjective:    Patient ID: Warren [name], male    DOB: 08-17-1955, 57 y.o.   MRN: [id number]  HPI This 57 y.o. male presents for evaluation of multiple acute issues; one week follow-up for the following:  1. Myalgias:one week follow-up; improved slightly from last week; + elevated ESR of 130 last week.  CK normal; ANA normal; RF normal.  Feels slightly better.  Low grade fever last night; 98.7.  Increased sputum production.   +new onset ankle swelling B; persistent for two days.  Lower back pain improved.   Muscle aches less severe; slightly improved.  No hemoptysis.  Did suffer with leg swelling new onset last week B ankles; duration two days and then resolved after elevation of legs.  2.  Anemia: worsening last week; hemosure negative.  Referred to GI for repeat colonoscopy and EGD due to anemia, decreased appetite/early satiety/anorexia.  Appointment with GI specialist next week.    3. Hyponatremia:  One week follow-up; sodium worsened from 133 to 128 last week.  Poor po intake over past week.  4.  Malnutrition/weight loss:  Weight down four pounds in past week.  Early satiety; wife encouraging to eat but pt gets irritable.     Review of Systems  Constitutional: Positive for chills, activity change, appetite change and fatigue. Negative for fever and diaphoresis.  Respiratory: Positive for cough and shortness of breath. Negative for wheezing.   Cardiovascular: Positive for leg swelling. Negative for chest pain.  Gastrointestinal: Negative for nausea, vomiting, abdominal pain, diarrhea, constipation, blood in stool and anal bleeding.  Genitourinary: Negative for dysuria, urgency and flank pain.  Musculoskeletal: Positive for myalgias and back pain.  Neurological: Positive for light-headedness.    Past Medical History  Diagnosis Date  . Allergic rhinitis   . COPD (chronic obstructive pulmonary disease)   . Osteoporosis     L  hip fracture  . Testicular atrophy     Right    Past Surgical History  Procedure Laterality Date  . Nasal fracture surgery    . Vasectomy    . Inguinal hernia repair      bilateral  . Colonoscopy w/ polypectomy  12/07/2006    three polyps sigmoid.  Merleen Milliner. Repeat 3 years.    Prior to Admission medications   Medication Sig Start Date End Date Taking? Authorizing Provider  albuterol (PROAIR HFA) 108 (90 BASE) MCG/ACT inhaler Inhale 2 puffs into the lungs every 6 (six) hours as needed. 07/28/12  Yes Ethelda Chick, MD  budesonide-formoterol [facility]) 160-4.5 MCG/ACT inhaler Inhale 2 puffs into the lungs 2 (two) times daily. 07/28/12  Yes Ethelda Chick, MD  fluticasone (FLONASE) 50 MCG/ACT nasal spray Place 2 sprays into the nose daily. 2 sprays in each nostril daily as needed 07/28/12  Yes Ethelda Chick, MD  Multiple Vitamin (MULTIVITAMIN) tablet Take 1 tablet by mouth daily.     Yes Historical Provider, MD  tamsulosin (FLOMAX) 0.4 MG CAPS Take 1 capsule (0.4 mg total) by mouth daily. 07/28/12  Yes Ethelda Chick, MD  tiotropium (SPIRIVA HANDIHALER) 18 MCG inhalation capsule Place 1 capsule (18 mcg total) into inhaler and inhale daily. 07/28/12  Yes Ethelda Chick, MD  Calcium-Vitamin D-Vitamin K (CALCIUM SOFT CHEWS PO) 2 chews daily     Historical Provider, MD  cefdinir (OMNICEF) 300 MG capsule Take 2 capsules (600 mg total) by mouth daily. 07/28/12  Ethelda Chick, MD  levofloxacin (LEVAQUIN) 750 MG tablet Take 1 tablet (750 mg total) by mouth daily. 09/08/12   Ethelda Chick, MD  predniSONE (DELTASONE) 20 MG tablet Two tablets daily x 5 days then one tablet daily x 5 days 07/28/12   Ethelda Chick, MD  triamcinolone cream (KENALOG) 0.1 % Apply topically 2 (two) times daily. 07/28/12   Ethelda Chick, MD    Allergies  Allergen Reactions  . Iodine     History   Social History  . Marital Status: Married    Spouse Name: N/A    Number of Children: N/A  . Years of Education: N/A    Occupational History  . 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 Smoking status: Current Every Day Smoker -- 1.50 packs/day for 30 years  . Smokeless tobacco: Not on file     Comment: pt says he is smoking 5 or 6 cigarettes daily  . Alcohol Use: No     Comment: 2 beers monthly  . Drug Use: No  . Sexually Active: Not on file   Other Topics Concern  . Not on file   Social History Narrative   Marital status: married      Children:  One; no grandchildren.      Lives: with wife, son      Employment: truck driver; hauls beer.  Drives locally     Tobacco: 1 ppd x 30 years      Alcohol:  Rare      Drugs: none    Family History  Problem Relation Age of Onset  . Heart disease Father     valve replacement; CHF; heart transplant candidate  . Hypertension Mother   . Hyperlipidemia Mother   . Diabetes Mother   . Diabetes Sister   . Hyperlipidemia Sister   . Stroke Brother        Objective:   Physical Exam  Nursing note and vitals reviewed. Constitutional: He is oriented to person, place, and time. He appears well-developed and well-nourished. No distress.  HENT:  Mouth/Throat: Oropharynx is clear and moist.  Eyes: Conjunctivae and EOM are normal. Pupils are equal, round, and reactive to light.  Neck: Normal range of motion. Neck supple. No tracheal deviation present. No thyromegaly present.  Cardiovascular: Normal rate, regular rhythm and normal heart sounds.   No murmur heard. Rate 100.  Pulmonary/Chest: No accessory muscle usage. Not tachypneic. No respiratory distress. He has no wheezes. He has no rales.  Distant breath sounds throughout.  Lymphadenopathy:    He has no cervical adenopathy.  Neurological: He is alert and oriented to person, place, and time.  Skin: Skin is warm and dry. No rash noted. He is not diaphoretic.  Psychiatric: He has a normal mood and affect. His behavior is normal.      Results for orders placed in  visit on 09/08/12  POCT CBC      Result Value Range   WBC 11.3 (*) 4.6 - 10.2 K/uL   Lymph, poc 1.9  0.6 - 3.4   POC LYMPH PERCENT 16.7  10 - 50 %L   MID (cbc) 0.7  0 - 0.9   POC MID % 6.1  0 - 12 %M   POC Granulocyte 8.7 (*) 2 - 6.9   Granulocyte percent 77.2  37 - 80 %G   RBC 3.33 (*) 4.69 - 6.13 M/uL   Hemoglobin 10.5 (*)  14.1 - 18.1 g/dL   HCT, POC 14.7 (*) 82.9 - 53.7 %   MCV 101.2 (*) 80 - 97 fL   MCH, POC 31.5 (*) 27 - 31.2 pg   MCHC 31.2 (*) 31.8 - 35.4 g/dL   RDW, POC 56.2     Platelet Count, POC 549 (*) 142 - 424 K/uL   MPV 6.8  0 - 99.8 fL  POCT URINALYSIS DIPSTICK      Result Value Range   Color, UA yellow     Clarity, UA clear     Glucose, UA negative     Bilirubin, UA negative     Ketones, UA negative     Spec Grav, UA 1.015     Blood, UA small     pH, UA 7.0     Protein, UA trace     Urobilinogen, UA 0.2     Nitrite, UA negative     Leukocytes, UA Negative    POCT UA - MICROSCOPIC ONLY      Result Value Range   WBC, Ur, HPF, POC 0-2     RBC, urine, microscopic 11-25     Bacteria, U Microscopic 1+     Mucus, UA negative     Epithelial cells, urine per micros negative     Crystals, Ur, HPF, POC negative     Casts, Ur, LPF, POC negative     Yeast, UA negative        [facility] reading (PRIMARY) by  Dr. Katrinka Blazing.  CXR: RUQ infiltrate.    EKG:  Sinus tachycardia at 102.   Assessment & Plan:  Anemia - Plan: POCT CBC, Comprehensive metabolic panel  Myalgia - Plan: POCT CBC, POCT SEDIMENTATION RATE, Comprehensive metabolic panel, DG Chest 2 View  COPD (chronic obstructive pulmonary disease) - Plan: POCT CBC, POCT SEDIMENTATION RATE, Comprehensive metabolic panel, DG Chest 2 View  Leg swelling - Plan: EKG 12-Lead, POCT urinalysis dipstick, POCT UA - Microscopic Only  CAP (community acquired pneumonia) - Plan: levofloxacin (LEVAQUIN) 750 MG tablet   1. Community Acquired Pneumonia:  New.  Rx for Levaquin; close follow-up; no respiratory distress. 2.   Myalgias:  Improved slightly; repeat ESR; normal CK, ANA, RF.  If persists with persistently elevated ESR, will refer to rheumatology but likely due to pneumonia. Close follow-up. 3.  COPD: stable without exacerbation. 4.  Anemia: worsening; appointment in one week with GI; hemosure negative last week. 5.  Leg swelling: New.  No swelling today; stable U/a; obtain labs; stable EKG; may be secondary to malnutrition, low albumin.  Encourage high protein diet. 6.  Weight loss unintentional: worsening; appointment with GI next week for colonoocopy, EGD.  If persists and GI work up negative, obtain CT chest,abd/pelvis. 7.  Hyponatremia: worsening; repeat today.  Meds ordered this encounter  Medications  . levofloxacin (LEVAQUIN) 750 MG tablet    Sig: Take 1 tablet (750 mg total) by mouth daily.    Dispense:  10 tablet    Refill:  0

## 2012-09-08 NOTE — Patient Instructions (Addendum)
1. RETURN FOR FOLLOW-UP ON THE FOLLOWING DAYS:  Thursday, 6/5 4-8:30, Friday, 6/6 2-6; Wednesday 6/11 4-10.

## 2012-09-17 ENCOUNTER — Encounter: Payer: Self-pay | Admitting: Physician Assistant

## 2012-09-17 DIAGNOSIS — D649 Anemia, unspecified: Secondary | ICD-10-CM | POA: Insufficient documentation

## 2012-09-24 HISTORY — PX: OTHER SURGICAL HISTORY: SHX169

## 2012-09-24 HISTORY — PX: COLONOSCOPY W/ POLYPECTOMY: SHX1380

## 2012-09-29 ENCOUNTER — Ambulatory Visit: Payer: BC Managed Care – PPO

## 2012-09-29 ENCOUNTER — Ambulatory Visit (INDEPENDENT_AMBULATORY_CARE_PROVIDER_SITE_OTHER): Payer: BC Managed Care – PPO | Admitting: Family Medicine

## 2012-09-29 VITALS — BP 93/58 | HR 106 | Temp 97.5°F | Resp 20 | Ht 66.0 in | Wt 98.4 lb

## 2012-09-29 DIAGNOSIS — R5381 Other malaise: Secondary | ICD-10-CM

## 2012-09-29 DIAGNOSIS — J189 Pneumonia, unspecified organism: Secondary | ICD-10-CM

## 2012-09-29 DIAGNOSIS — R42 Dizziness and giddiness: Secondary | ICD-10-CM

## 2012-09-29 DIAGNOSIS — D649 Anemia, unspecified: Secondary | ICD-10-CM

## 2012-09-29 DIAGNOSIS — K635 Polyp of colon: Secondary | ICD-10-CM

## 2012-09-29 DIAGNOSIS — D126 Benign neoplasm of colon, unspecified: Secondary | ICD-10-CM

## 2012-09-29 DIAGNOSIS — E871 Hypo-osmolality and hyponatremia: Secondary | ICD-10-CM

## 2012-09-29 DIAGNOSIS — R634 Abnormal weight loss: Secondary | ICD-10-CM

## 2012-09-29 DIAGNOSIS — B3781 Candidal esophagitis: Secondary | ICD-10-CM

## 2012-09-29 LAB — COMPREHENSIVE METABOLIC PANEL
AST: 19 U/L (ref 0–37)
Albumin: 3.6 g/dL (ref 3.5–5.2)
Alkaline Phosphatase: 85 U/L (ref 39–117)
Calcium: 8.8 mg/dL (ref 8.4–10.5)
Chloride: 96 mEq/L (ref 96–112)
Glucose, Bld: 132 mg/dL — ABNORMAL HIGH (ref 70–99)
Potassium: 4.2 mEq/L (ref 3.5–5.3)
Sodium: 129 mEq/L — ABNORMAL LOW (ref 135–145)
Total Protein: 7.3 g/dL (ref 6.0–8.3)

## 2012-09-29 LAB — POCT CBC
Granulocyte percent: 66.9 %G (ref 37–80)
HCT, POC: 35.2 % — AB (ref 43.5–53.7)
POC Granulocyte: 5 (ref 2–6.9)
POC LYMPH PERCENT: 24.4 %L (ref 10–50)
Platelet Count, POC: 436 10*3/uL — AB (ref 142–424)
RBC: 3.53 M/uL — AB (ref 4.69–6.13)
RDW, POC: 13.5 %

## 2012-09-29 NOTE — Patient Instructions (Addendum)
1.  START FERROUS SULFATE (DRUG STORE) 325MG  ONE TABLET DAILY FOR LOW IRON LEVELS AND ANEMIA.  FERROUS SULFATE CAN BE CONSTIPATING, SO YOU MAY WANT TO START STOOL SOFTENER (COLACE GENERIC OR MIRALAX GENERIC).  FERROUS SULFATE WILL TURN STOOLS DARK. 2.  MUCINEX ONE TABLET TWICE DAILY FOR THE NEXT WEEK TO HELP WITH MUCOUS PRODUCTION. 3. RETURN IN 3-4 WEEKS FOR FOLLOW-UP (WEIGHT CHECK AND REPEAT CHEST XRAY). 4.  PURCHASE NASACORT AQ 1-2 SPRAYS DAILY FOR NOSE CONGESTION.

## 2012-09-29 NOTE — Progress Notes (Signed)
[address]   [place], [place]  [address]   [phone]  Subjective:    Patient ID: Warren [name], male    DOB: Jul 01, 1955, 57 y.o.   MRN: [id number]  HPI This 57 y.o. male presents for evaluation of the following:  1.  Dizziness: with change of positions getting up from floor to stand or with bending over.  Onset of dizziness 1-2 weeks.  2.  Pneumonia Community Acquired:  S/p Levaquin therapy; having a hard time coughing up sputum this week; +tightness lately; no wheezing.  No bloody sputum.  No fever; +chronic chills but nothing worse.  Body aches still present in the morning; intermittent.  No longer sleeping all weekend.  Able to stay up also.  Picking up brush yesterday; 50% better from fatigue but energy still low.    3. Hyponatremia:  Sodium down to 126 at last visit; started drinking Gatorade; fountain Gatorade.    4. Anemia:  Iron deficiency.  Has not started ferrous sulfate yet.  S/p colonoscopy and EGD.  EGD revealed candidiasis; started on Diflucan and Prilosec; colonoscopy showed polyps; repeat colonoscopy recommended in 3-5 years.  5.  Weight loss:  Appetite has improved in the past week.  A lot better.  Eating really well last week.  Weight up 3 pounds in past three weeks.  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  Trying to drink milkshakes.  Review of Systems  Constitutional: Negative for fever, chills, diaphoresis, activity change, appetite change and fatigue.  HENT: Negative for congestion, rhinorrhea, sneezing and postnasal drip.   Respiratory: Positive for cough, shortness of breath and wheezing.   Cardiovascular: Negative for chest pain, palpitations and leg swelling.  Gastrointestinal: Negative for nausea, vomiting, abdominal pain, diarrhea, constipation, blood in stool, abdominal distention, anal bleeding and rectal pain.  Neurological: Positive for dizziness and light-headedness. Negative for tremors, seizures, syncope, facial asymmetry, speech difficulty, weakness,  numbness and headaches.   Past Medical History  Diagnosis Date  . Allergic rhinitis   . COPD (chronic obstructive pulmonary disease)   . Osteoporosis     L hip fracture  . Testicular atrophy     Right  . Candidal esophagitis     Dr. Elnoria Howard 10/2012   Current Outpatient Prescriptions on File Prior to Visit  Medication Sig Dispense Refill  . albuterol (PROAIR HFA) 108 (90 BASE) MCG/ACT inhaler Inhale 2 puffs into the lungs every 6 (six) hours as needed.  1 Inhaler  11  . budesonide-formoterol (SYMBICORT) 160-4.5 MCG/ACT inhaler Inhale 2 puffs into the lungs 2 (two) times daily.  10.2 g  11  . fluticasone (FLONASE) 50 MCG/ACT nasal spray Place 2 sprays into the nose daily. 2 sprays in each nostril daily as needed  16 g  11  . tamsulosin (FLOMAX) 0.4 MG CAPS Take 1 capsule (0.4 mg total) by mouth daily.  30 capsule  12  . tiotropium (SPIRIVA HANDIHALER) 18 MCG inhalation capsule Place 1 capsule (18 mcg total) into inhaler and inhale daily.  30 capsule  11  . Calcium-Vitamin D-Vitamin K (CALCIUM SOFT CHEWS PO) 2 chews daily       . cefdinir (OMNICEF) 300 MG capsule Take 2 capsules (600 mg total) by mouth daily.  20 capsule  0  . levofloxacin (LEVAQUIN) 750 MG tablet Take 1 tablet (750 mg total) by mouth daily.  10 tablet  0  . Multiple Vitamin (MULTIVITAMIN) tablet Take 1 tablet by mouth daily.        Marland Kitchen  predniSONE (DELTASONE) 20 MG tablet Two tablets daily x 5 days then one tablet daily x 5 days  15 tablet  0  . triamcinolone cream (KENALOG) 0.1 % Apply topically 2 (two) times daily.  45 g  0   Current Facility-Administered Medications on File Prior to Visit  Medication Dose Route Frequency Provider Last Rate Last Dose  . albuterol (PROVENTIL) (2.5 MG/3ML) 0.083% nebulizer solution 2.5 mg  2.5 mg Nebulization Once Ethelda Chick, MD       History   Social History  . Marital Status: Married    Spouse Name: N/A    Number of Children: N/A  . Years of Education: N/A   Occupational History    . 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 Smoking status: Current Every Day Smoker -- 1.50 packs/day for 30 years  . Smokeless tobacco: Not on file     Comment: pt says he is smoking 5 or 6 cigarettes daily  . Alcohol Use: No     Comment: 2 beers monthly  . Drug Use: No  . Sexually Active: Not on file   Other Topics Concern  . Not on file   Social History Narrative   Marital status: married      Children:  One; no grandchildren.      Lives: with wife, son      Employment: truck driver; hauls beer.  Drives locally     Tobacco: 1 ppd x 30 years      Alcohol:  Rare      Drugs: none   Family History  Problem Relation Age of Onset  . Heart disease Father     valve replacement; CHF; heart transplant candidate  . Hypertension Mother   . Hyperlipidemia Mother   . Diabetes Mother   . Diabetes Sister   . Hyperlipidemia Sister   . Stroke Brother        Objective:   Physical Exam  Nursing note and vitals reviewed. Constitutional: He is oriented to person, place, and time. He appears well-developed. He appears cachectic. No distress.  HENT:  Head: Normocephalic and atraumatic.  Right Ear: External ear normal.  Left Ear: External ear normal.  Nose: Nose normal.  Mouth/Throat: Oropharynx is clear and moist.  Eyes: Conjunctivae and EOM are normal. Pupils are equal, round, and reactive to light.  Neck: Normal range of motion. Neck supple. No thyromegaly present.  Cardiovascular: Normal rate, regular rhythm, normal heart sounds and intact distal pulses.  Exam reveals no gallop and no friction rub.   No murmur heard. Pulmonary/Chest: No respiratory distress. He has no wheezes. He has no rales.  Distant breath sounds throughout.  Abdominal: Soft. Bowel sounds are normal. He exhibits no distension. There is no tenderness. There is no rebound and no guarding.  Lymphadenopathy:    He has no cervical adenopathy.  Neurological: He is alert and  oriented to person, place, and time. No cranial nerve deficit. He exhibits normal muscle tone. Coordination normal.  Skin: Skin is warm and dry. He is not diaphoretic.  Psychiatric: He has a normal mood and affect. His behavior is normal.      Results for orders placed in visit on 09/29/12  POCT CBC      Result Value Range   WBC 7.5  4.6 - 10.2 K/uL   Lymph, poc 1.8  0.6 - 3.4   POC LYMPH PERCENT 24.4  10 - 50 %L  MID (cbc) 0.7  0 - 0.9   POC MID % 8.7  0 - 12 %M   POC Granulocyte 5.0  2 - 6.9   Granulocyte percent 66.9  37 - 80 %G   RBC 3.53 (*) 4.69 - 6.13 M/uL   Hemoglobin 11.3 (*) 14.1 - 18.1 g/dL   HCT, POC 16.1 (*) 09.6 - 53.7 %   MCV 99.7 (*) 80 - 97 fL   MCH, POC 32.0 (*) 27 - 31.2 pg   MCHC 32.1  31.8 - 35.4 g/dL   RDW, POC 04.5     Platelet Count, POC 436 (*) 142 - 424 K/uL   MPV 7.4  0 - 99.8 fL   [facility] reading (PRIMARY) by  Dr. Katrinka Blazing.  CXR:  Decreased RUL infiltrate yet still present.   Assessment & Plan:  Anemia - Plan: POCT CBC  Pneumonia - Plan: DG Chest 2 View  Hyponatremia - Plan: Comprehensive metabolic panel  Loss of weight - Plan: POCT SEDIMENTATION RATE  Candidal esophagitis  Colon polyps  Dizziness  Other malaise and fatigue - Plan: Vitamin D 25 hydroxy   1. Anemia: persistent; advised to start ferrous sulfate 325mg  one daily for the next three months. 2.  Pneumonia: slight improvement in infiltrate; clinically improved; follow-up in one month for repeat CXR; if infiltrate still present in one month, will warrant CT chest. 3.  Hyponatremia: worsening at last visit; repeat today; dizziness with position changes. 4.  Loss of weight: improved; repeat ESR today. 5.  Candidal esophagitis: New. Diagnosed by EGD; undergoing treatment with Diflucan per GI. 5.  Colon Polyps: New; detected on colonoscopy; no site of bleeding.  Repeat colonoscopy warranted in 3-5 years. 6. Dizziness: New. Encourage fluid intake and improved po intake; repeat labs today.  Neurologically intact. 7. Fatigue: improving; obtain Vitamin D level per request of patient.  Meds ordered this encounter  Medications  . omeprazole (PRILOSEC) 40 MG capsule    Sig: Take 40 mg by mouth daily.  . fluconazole (DIFLUCAN) 100 MG tablet    Sig: Take 100 mg by mouth daily.

## 2012-10-04 ENCOUNTER — Telehealth: Payer: Self-pay | Admitting: Radiology

## 2012-10-04 NOTE — Telephone Encounter (Signed)
Pt advised.

## 2012-10-04 NOTE — Telephone Encounter (Signed)
Pt rt returning Amy's call please return his call at [phone].

## 2012-10-04 NOTE — Telephone Encounter (Signed)
Left message for patient to call back about the labs receive from Dr Elnoria Howard, he has low iron and needs to be on daily iron suppliment, if Dr hung has not already done this, he should be on OTC ferrous sulfate 325 once daily, per Dr Katrinka Blazing

## 2012-10-23 ENCOUNTER — Encounter: Payer: Self-pay | Admitting: Family Medicine

## 2012-11-03 ENCOUNTER — Ambulatory Visit (INDEPENDENT_AMBULATORY_CARE_PROVIDER_SITE_OTHER): Payer: BC Managed Care – PPO | Admitting: Family Medicine

## 2012-11-03 ENCOUNTER — Ambulatory Visit: Payer: BC Managed Care – PPO

## 2012-11-03 VITALS — BP 110/80 | HR 87 | Temp 98.0°F | Resp 16 | Ht 65.5 in | Wt 98.0 lb

## 2012-11-03 DIAGNOSIS — J189 Pneumonia, unspecified organism: Secondary | ICD-10-CM

## 2012-11-03 DIAGNOSIS — J449 Chronic obstructive pulmonary disease, unspecified: Secondary | ICD-10-CM

## 2012-11-03 DIAGNOSIS — E871 Hypo-osmolality and hyponatremia: Secondary | ICD-10-CM

## 2012-11-03 DIAGNOSIS — D509 Iron deficiency anemia, unspecified: Secondary | ICD-10-CM

## 2012-11-03 DIAGNOSIS — R634 Abnormal weight loss: Secondary | ICD-10-CM

## 2012-11-03 LAB — COMPREHENSIVE METABOLIC PANEL
ALT: 9 U/L (ref 0–53)
AST: 16 U/L (ref 0–37)
Albumin: 3.8 g/dL (ref 3.5–5.2)
Calcium: 9.4 mg/dL (ref 8.4–10.5)
Chloride: 96 mEq/L (ref 96–112)
Creat: 0.73 mg/dL (ref 0.50–1.35)
Potassium: 4.4 mEq/L (ref 3.5–5.3)
Sodium: 128 mEq/L — ABNORMAL LOW (ref 135–145)
Total Protein: 7.1 g/dL (ref 6.0–8.3)

## 2012-11-03 LAB — POCT CBC
Granulocyte percent: 58.3 %G (ref 37–80)
MID (cbc): 0.5 (ref 0–0.9)
MPV: 7.1 fL (ref 0–99.8)
POC MID %: 7.8 %M (ref 0–12)
Platelet Count, POC: 313 10*3/uL (ref 142–424)
RBC: 3.68 M/uL — AB (ref 4.69–6.13)

## 2012-11-03 NOTE — Progress Notes (Signed)
[address]   [place], [place]  [address]   [phone]  Subjective:    Patient ID: Warren [name], male    DOB: January 18, 1956, 57 y.o.   MRN: [id number]  HPI This 57 y.o. male presents for evaluation of the following  1.  Community Acquired Pneumonia: presenting for repeat CXR.  More energy; breathing better.  Noticed improvement after taking more iron.  Daily cough but back to baseline; +some sputum but no change in color.  2. Anemia: one month follow-up; started ferrous sulfate 325mg  one daily at last visit.  Improved energy.  Taking stool softener daily.  Still getting light headed; no balance at all.  With standing, gets lightheaded.  Trying to drink plenty of fluids.  Drinks 50 ounces of Gatorade daily at work.  Air conditioned truck; minimal sweating.  Dizziness the same.    3.  Hyponatremia: sodium 129 at last visit.   4.  Esophagitis Candidal: has completed Diflucan therapy.    5.  GERD: taking Omeprazole; to take another month.  F/u Elnoria Howard since last visit; follow-up for six months.  Next colonoscopy 3-5 years.    6. Weight loss: appetite has increased; eating much better.  Weight unchanged from last visit.   Review of Systems  Constitutional: Negative for fever, chills, diaphoresis, activity change, appetite change and fatigue.  Respiratory: Positive for cough, shortness of breath and wheezing.   Cardiovascular: Negative for chest pain, palpitations and leg swelling.  Gastrointestinal: Positive for constipation. Negative for nausea, vomiting, abdominal pain and diarrhea.  Neurological: Positive for dizziness and light-headedness. Negative for tremors, seizures, syncope, facial asymmetry, speech difficulty, weakness, numbness and headaches.    Past Medical History  Diagnosis Date  . Allergic rhinitis   . COPD (chronic obstructive pulmonary disease)   . Osteoporosis     L hip fracture  . Testicular atrophy     Right  . Candidal esophagitis     Dr. Elnoria Howard 10/2012    Past  Surgical History  Procedure Laterality Date  . Nasal fracture surgery    . Vasectomy    . Inguinal hernia repair      bilateral  . Colonoscopy w/ polypectomy  12/07/2006    three polyps sigmoid.  Merleen Milliner. Repeat 3 years.  . Egd  09/24/2012    hiatal hernia; candidal esophagitis, hematin in stomach; no active source of bleeding.  . Colonoscopy w/ polypectomy  09/24/2012    five sessile polyps removed.  Internal and external hemorrhoids.    Prior to Admission medications   Medication Sig Start Date End Date Taking? Authorizing Provider  albuterol (PROAIR HFA) 108 (90 BASE) MCG/ACT inhaler Inhale 2 puffs into the lungs every 6 (six) hours as needed. 07/28/12  Yes Ethelda Chick, MD  budesonide-formoterol [facility]) 160-4.5 MCG/ACT inhaler Inhale 2 puffs into the lungs 2 (two) times daily. 07/28/12  Yes Ethelda Chick, MD  fluticasone (FLONASE) 50 MCG/ACT nasal spray Place 2 sprays into the nose daily. 2 sprays in each nostril daily as needed 07/28/12  Yes Ethelda Chick, MD  omeprazole (PRILOSEC) 40 MG capsule Take 40 mg by mouth daily.   Yes Historical Provider, MD  tamsulosin (FLOMAX) 0.4 MG CAPS Take 1 capsule (0.4 mg total) by mouth daily. 07/28/12  Yes Ethelda Chick, MD  tiotropium (SPIRIVA HANDIHALER) 18 MCG inhalation capsule Place 1 capsule (18 mcg total) into inhaler and inhale daily. 07/28/12  Yes Ethelda Chick, MD  Calcium-Vitamin D-Vitamin K (CALCIUM SOFT CHEWS PO)  2 chews daily     Historical Provider, MD  cefdinir (OMNICEF) 300 MG capsule Take 2 capsules (600 mg total) by mouth daily. 07/28/12   Ethelda Chick, MD  fluconazole (DIFLUCAN) 100 MG tablet Take 100 mg by mouth daily.    Historical Provider, MD  levofloxacin (LEVAQUIN) 750 MG tablet Take 1 tablet (750 mg total) by mouth daily. 09/08/12   Ethelda Chick, MD  Multiple Vitamin (MULTIVITAMIN) tablet Take 1 tablet by mouth daily.      Historical Provider, MD  predniSONE (DELTASONE) 20 MG tablet Two tablets daily x 5 days then one  tablet daily x 5 days 07/28/12   Ethelda Chick, MD  triamcinolone cream (KENALOG) 0.1 % Apply topically 2 (two) times daily. 07/28/12   Ethelda Chick, MD    Allergies  Allergen Reactions  . Iodine     History   Social History  . Marital Status: Married    Spouse Name: N/A    Number of Children: N/A  . Years of Education: N/A   Occupational History  . 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 Smoking status: Current Every Day Smoker -- 1.50 packs/day for 30 years  . Smokeless tobacco: Not on file     Comment: pt says he is smoking 5 or 6 cigarettes daily  . Alcohol Use: No     Comment: 2 beers monthly  . Drug Use: No  . Sexually Active: Not on file   Other Topics Concern  . Not on file   Social History Narrative   Marital status: married      Children:  One; no grandchildren.      Lives: with wife, son      Employment: truck driver; hauls beer.  Drives locally     Tobacco: 1 ppd x 30 years      Alcohol:  Rare      Drugs: none    Family History  Problem Relation Age of Onset  . Heart disease Father     valve replacement; CHF; heart transplant candidate  . Hypertension Mother   . Hyperlipidemia Mother   . Diabetes Mother   . Diabetes Sister   . Hyperlipidemia Sister   . Stroke Brother        Objective:   Physical Exam  Nursing note and vitals reviewed. Constitutional: He is oriented to person, place, and time. He appears well-developed. No distress.  HENT:  Head: Normocephalic and atraumatic.  Mouth/Throat: Oropharynx is clear and moist.  Eyes: Conjunctivae and EOM are normal. Pupils are equal, round, and reactive to light.  Neck: Normal range of motion. Neck supple. No thyromegaly present.  Cardiovascular: Normal rate, regular rhythm and normal heart sounds.  Exam reveals no gallop and no friction rub.   No murmur heard. Pulmonary/Chest: Effort normal.  Distant breath sounds throughout; prolonged expiratory phase.    Lymphadenopathy:    He has no cervical adenopathy.  Neurological: He is alert and oriented to person, place, and time. No cranial nerve deficit. He exhibits normal muscle tone. Coordination normal.  Skin: He is not diaphoretic.  Psychiatric: He has a normal mood and affect. His behavior is normal.       Results for orders placed in visit on 11/03/12  POCT CBC      Result Value Range   WBC 6.3  4.6 - 10.2 K/uL   Lymph, poc 2.1  0.6 - 3.4  POC LYMPH PERCENT 33.9  10 - 50 %L   MID (cbc) 0.5  0 - 0.9   POC MID % 7.8  0 - 12 %M   POC Granulocyte 3.7  2 - 6.9   Granulocyte percent 58.3  37 - 80 %G   RBC 3.68 (*) 4.69 - 6.13 M/uL   Hemoglobin 11.6 (*) 14.1 - 18.1 g/dL   HCT, POC 16.1 (*) 09.6 - 53.7 %   MCV 99.8 (*) 80 - 97 fL   MCH, POC 31.5 (*) 27 - 31.2 pg   MCHC 31.6 (*) 31.8 - 35.4 g/dL   RDW, POC 04.5     Platelet Count, POC 313  142 - 424 K/uL   MPV 7.1  0 - 99.8 fL   [facility] reading (PRIMARY) by  Dr. Katrinka Blazing.  CXR: decreased infiltrate RUL but still persistent.   Assessment & Plan:  Marland KitchenMarland KitchenPneumonia - Plan: POCT CBC, Comprehensive metabolic panel, DG Chest 2 View, POCT SEDIMENTATION RATE  Hyponatremia - Plan: POCT CBC, Comprehensive metabolic panel, DG Chest 2 View  Anemia, iron deficiency  Loss of weight - Plan: POCT SEDIMENTATION RATE  COPD (chronic obstructive pulmonary disease)   1.  Pneumonia: two month follow-up; improved; slight improvement in CXR; discussed need for CT chest to rule out underlying mass; pt agreeable.  Radiologist recommended on over-read to evaluate for Tuberculosis; patient going out of town but will have PPD placed upon return from vacation. 2. Hyponatremia: persistent; repeat today. 3.  Iron deficiency anemia: slowly improving; tolerating ferrous sulfate well; recommend switching to Miralax stool softener daily. 4.  Loss of weight: stable; repeat ESR.  Weight unchanged from one month ago. 5. COPD: stable. No acute issues with breathing.  No  orders of the defined types were placed in this encounter.

## 2012-11-06 ENCOUNTER — Telehealth: Payer: Self-pay

## 2012-11-06 NOTE — Telephone Encounter (Signed)
Notes Recorded by Ethelda Chick, MD on 11/06/2012 at 11:41 AM Call patient --- radiologist recommended evaluating patient to rule out tuberculosis as cause of recent pneumonia. Has he every been around anyone with tuberculosis? Recommend presenting to clinic for Tb skin test; he will need to come to 102 for TB skin test and then return 48 hours later for read of Tb skin test. Could he come today, Wednesday 7/30 and then return Friday, 8/1 for read?      Phone disconnected. Letter sent to paiten.

## 2012-11-06 NOTE — Telephone Encounter (Signed)
PT STATES SOMEONE CALLED REGARDING HIS XRAY. PLEASE CALL [phone]

## 2012-12-01 ENCOUNTER — Ambulatory Visit: Payer: BC Managed Care – PPO

## 2012-12-01 ENCOUNTER — Ambulatory Visit (INDEPENDENT_AMBULATORY_CARE_PROVIDER_SITE_OTHER): Payer: BC Managed Care – PPO | Admitting: Family Medicine

## 2012-12-01 VITALS — BP 98/70 | HR 77 | Temp 98.4°F | Resp 18 | Wt 101.0 lb

## 2012-12-01 DIAGNOSIS — J189 Pneumonia, unspecified organism: Secondary | ICD-10-CM

## 2012-12-01 DIAGNOSIS — D509 Iron deficiency anemia, unspecified: Secondary | ICD-10-CM

## 2012-12-01 DIAGNOSIS — R9389 Abnormal findings on diagnostic imaging of other specified body structures: Secondary | ICD-10-CM

## 2012-12-01 DIAGNOSIS — H6122 Impacted cerumen, left ear: Secondary | ICD-10-CM

## 2012-12-01 DIAGNOSIS — H6120 Impacted cerumen, unspecified ear: Secondary | ICD-10-CM

## 2012-12-01 DIAGNOSIS — R918 Other nonspecific abnormal finding of lung field: Secondary | ICD-10-CM

## 2012-12-01 DIAGNOSIS — E871 Hypo-osmolality and hyponatremia: Secondary | ICD-10-CM

## 2012-12-01 LAB — COMPREHENSIVE METABOLIC PANEL
Albumin: 3.8 g/dL (ref 3.5–5.2)
CO2: 25 mEq/L (ref 19–32)
Chloride: 99 mEq/L (ref 96–112)
Glucose, Bld: 92 mg/dL (ref 70–99)
Potassium: 3.9 mEq/L (ref 3.5–5.3)
Sodium: 131 mEq/L — ABNORMAL LOW (ref 135–145)
Total Protein: 6.8 g/dL (ref 6.0–8.3)

## 2012-12-01 LAB — POCT CBC
Granulocyte percent: 71.8 %G (ref 37–80)
HCT, POC: 36.9 % — AB (ref 43.5–53.7)
Hemoglobin: 11.6 g/dL — AB (ref 14.1–18.1)
MCV: 99.9 fL — AB (ref 80–97)
POC LYMPH PERCENT: 22 %L (ref 10–50)
RBC: 3.69 M/uL — AB (ref 4.69–6.13)

## 2012-12-01 IMAGING — CR DG CHEST 2V
2 series · 2 of 2 positions shown · non-contrast
Comparison: [DATE] and [DATE]

CLINICAL DATA: Follow up pneumonia

CHEST - 2 VIEW

[PA]
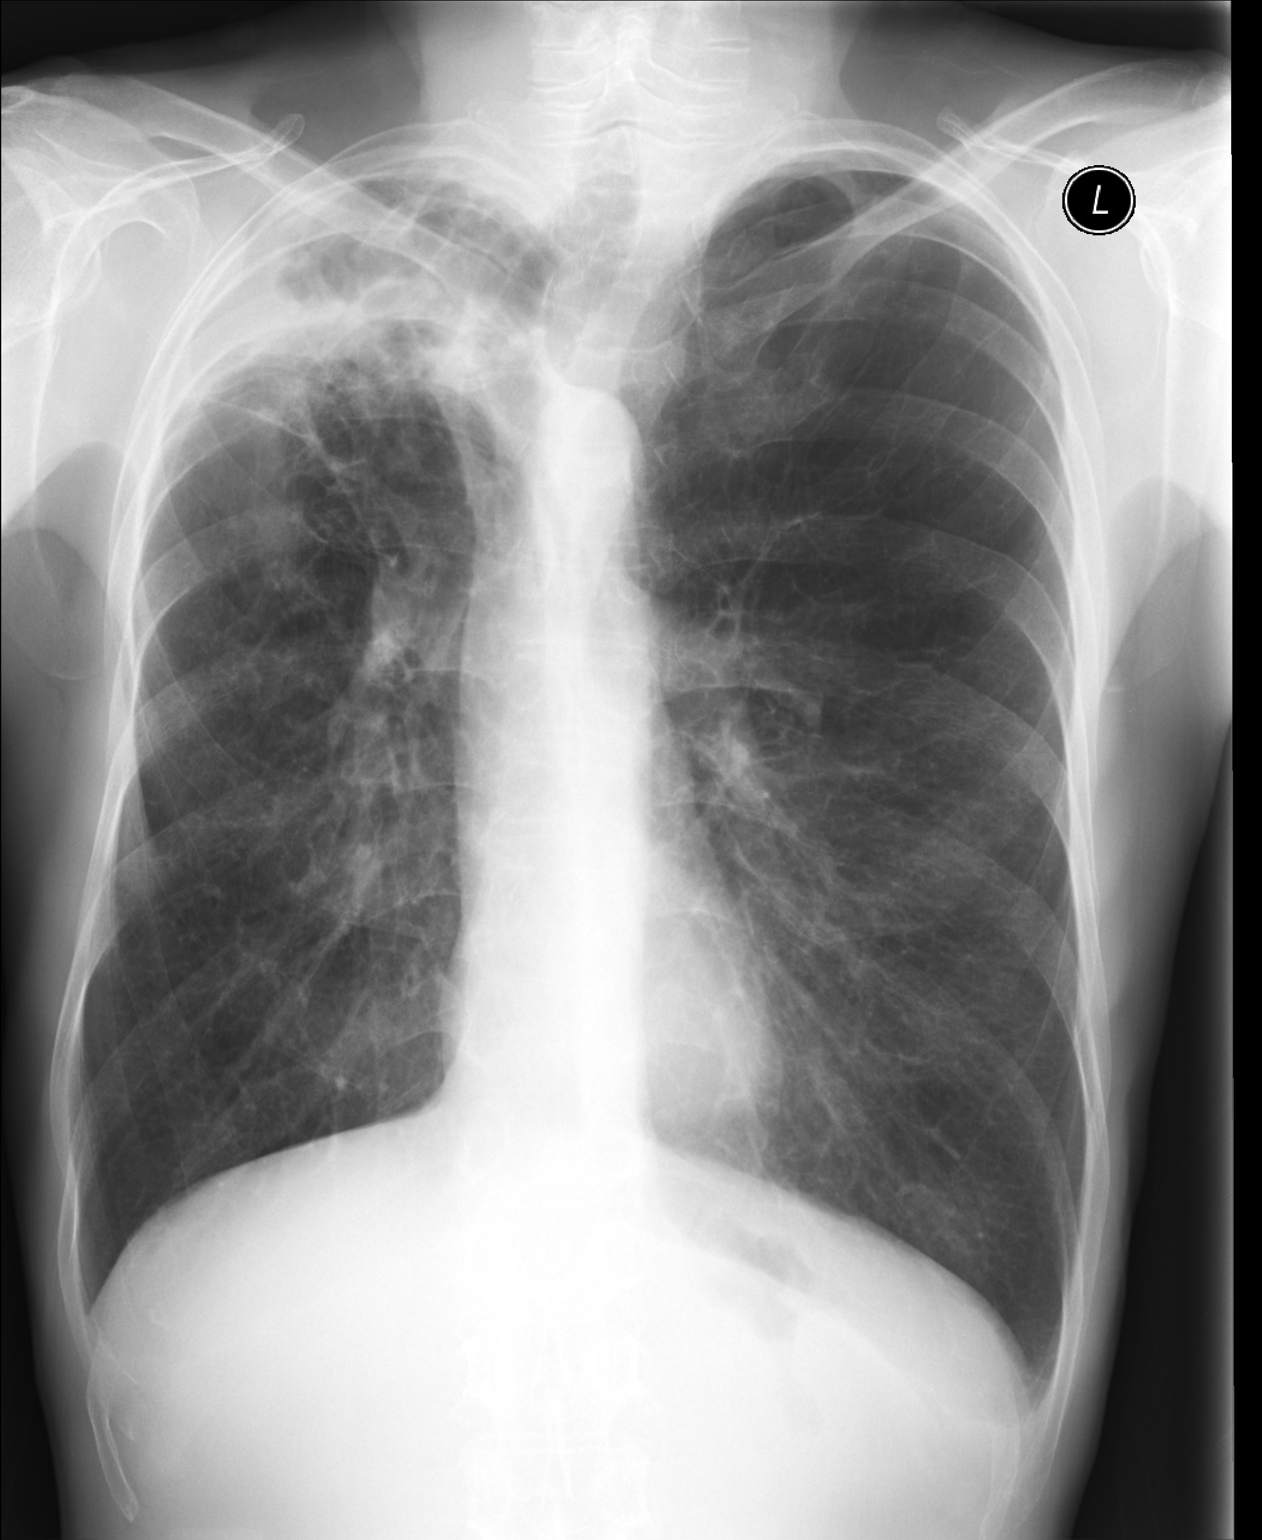

[lateral]
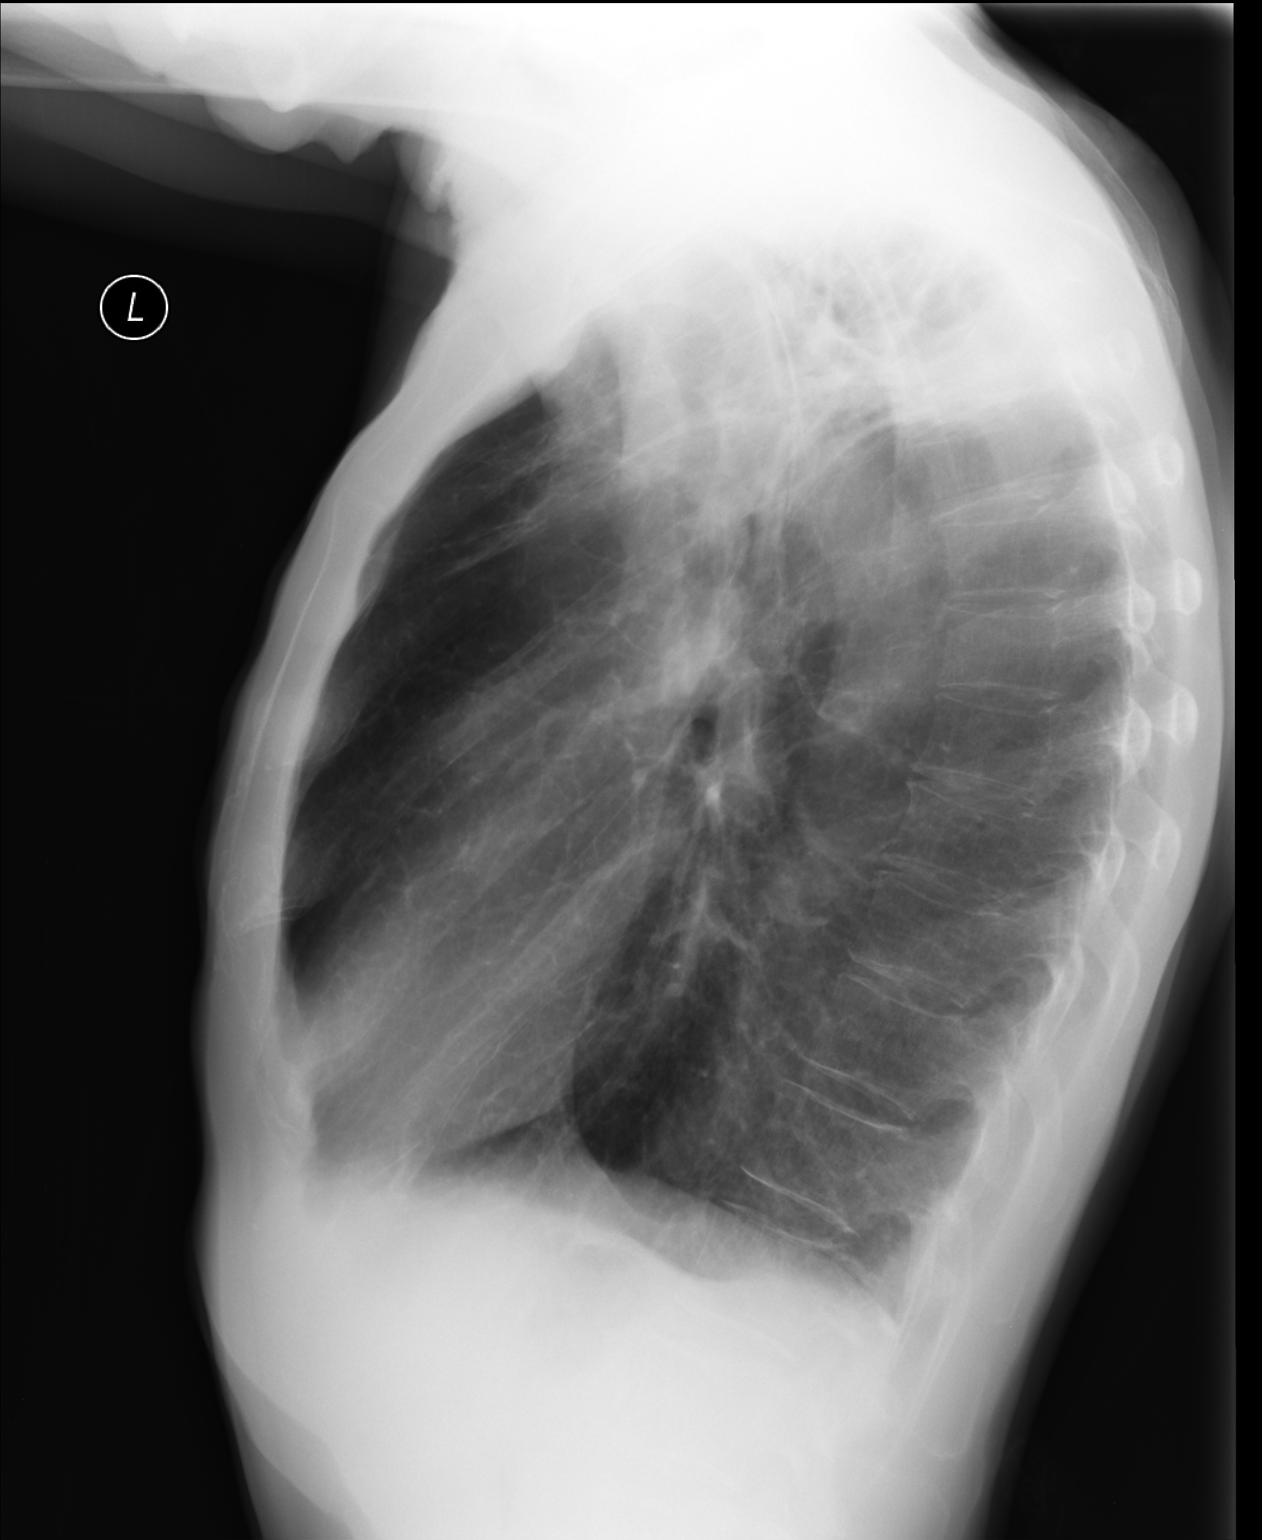

[2 of 2 positions shown; findings below may reference images not displayed]

FINDINGS: When compared to the [DATE] study, the apical opacity
is mildly decreased.  However, it is stable from the more recent
prior study dated [DATE].  There is architectural distortion
and shift of the upper midline structures to the right.  This is
all likely postinflammatory scarring.  It also retracts the right
hilum superiorly.

The lungs are hyperexpanded.  There is relative lucency mostly in
the upper lobes consistent with emphysema.  This is stable.  There
are no new lung infiltrates.

Cardiac silhouette is normal in size.  There are no mediastinal or
hilar masses.  No pleural effusion is seen.
IMPRESSION: No significant change from the most recent prior study.

Opacity at the right apex is likely all due to postinflammatory
scarring.

Marked COPD.

Clinically significant discrepancy from primary report, if
provided: None

## 2012-12-01 NOTE — Progress Notes (Signed)
[address]   [place], [place]  [address]   [phone]  Subjective:    Patient ID: Warren [name], male    DOB: [birth year], 57 y.o.   MRN: [id number]  HPI This 57 y.o. male presents for one month follow-up:  1.  RUL infiltrate/community acquired pneumonia: feeling well. Presenting for PPD placement; has not scheduled CT chest yet; waiting on insurance approval.  No fever/sweats; +chronic chills.  +chronic cough; no change in sputum amount or color.  No SOB; breathing is "great".  ESR one month improved to 60.  2. L cerumen impaction: requesting ear irrigation; evaluated at [facility] yesterday; unable to perform PPD but did evaluate ears; recommended ear irrigation.  3.  Iron deficiency anemia: did not increase iron supplement to bid due to constipation; denies bloody stools; no abdominal pain.  Due for repeat labs.  4. Hyponatremia: persistent at last visit; has been unsteady of feet but this is chronic.  Due for repeat labs.   5.Weight loss: [place] been at the beach for two weeks; weight has increased 2-3 pounds.   Review of Systems  Constitutional: Negative for fever, chills, diaphoresis, activity change, fatigue and unexpected weight change.  Respiratory: Positive for cough, shortness of breath and wheezing. Negative for stridor.   Cardiovascular: Negative for leg swelling.  Gastrointestinal: Negative for nausea, vomiting, abdominal pain, diarrhea, constipation, blood in stool and abdominal distention.  Neurological: Negative for dizziness, facial asymmetry, weakness, light-headedness, numbness and headaches.  Hematological: Negative for adenopathy. Does not bruise/bleed easily.   Past Medical History  Diagnosis Date  . Allergic rhinitis   . COPD (chronic obstructive pulmonary disease)   . Osteoporosis     L hip fracture  . Testicular atrophy     Right  . Candidal esophagitis     Dr. Elnoria Howard 10/2012   Past Surgical History  Procedure Laterality Date  . Nasal fracture surgery     . Vasectomy    . Inguinal hernia repair      bilateral  . Colonoscopy w/ polypectomy  12/07/2006    three polyps sigmoid.  Merleen Milliner. Repeat 3 years.  . Egd  09/24/2012    hiatal hernia; candidal esophagitis, hematin in stomach; no active source of bleeding.  . Colonoscopy w/ polypectomy  09/24/2012    five sessile polyps removed.  Internal and external hemorrhoids.   Allergies  Allergen Reactions  . Iodine    Current Outpatient Prescriptions on File Prior to Visit  Medication Sig Dispense Refill  . albuterol (PROAIR HFA) 108 (90 BASE) MCG/ACT inhaler Inhale 2 puffs into the lungs every 6 (six) hours as needed.  1 Inhaler  11  . budesonide-formoterol (SYMBICORT) 160-4.5 MCG/ACT inhaler Inhale 2 puffs into the lungs 2 (two) times daily.  10.2 g  11  . Calcium-Vitamin D-Vitamin K (CALCIUM SOFT CHEWS PO) 2 chews daily       . fluticasone (FLONASE) 50 MCG/ACT nasal spray Place 2 sprays into the nose daily. 2 sprays in each nostril daily as needed  16 g  11  . Multiple Vitamin (MULTIVITAMIN) tablet Take 1 tablet by mouth daily.        Marland Kitchen omeprazole (PRILOSEC) 40 MG capsule Take 40 mg by mouth daily.      . tamsulosin (FLOMAX) 0.4 MG CAPS Take 1 capsule (0.4 mg total) by mouth daily.  30 capsule  12  . tiotropium (SPIRIVA HANDIHALER) 18 MCG inhalation capsule Place 1 capsule (18 mcg total) into inhaler and inhale daily.  30 capsule  11   Current Facility-Administered Medications on File Prior to Visit  Medication Dose Route Frequency Provider Last Rate Last Dose  . albuterol (PROVENTIL) (2.5 MG/3ML) 0.083% nebulizer solution 2.5 mg  2.5 mg Nebulization Once Ethelda Chick, MD       History   Social History  . Marital Status: Married    Spouse Name: N/A    Number of Children: N/A  . Years of Education: N/A   Occupational History  . 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 Smoking status: Current Every Day Smoker -- 1.50 packs/day for 30 years  .  Smokeless tobacco: Not on file     Comment: pt says he is smoking 5 or 6 cigarettes daily  . Alcohol Use: No     Comment: 2 beers monthly  . Drug Use: No  . Sexual Activity: Not on file   Other Topics Concern  . Not on file   Social History Narrative   Marital status: married      Children:  One; no grandchildren.      Lives: with wife, son      Employment: truck driver; hauls beer.  Drives locally     Tobacco: 1 ppd x 30 years      Alcohol:  Rare      Drugs: none       Objective:   Physical Exam  Nursing note and vitals reviewed. Constitutional: He is oriented to person, place, and time. He appears well-developed. He appears cachectic. No distress.  HENT:  Head: Normocephalic and atraumatic.  L ear canal with cerumen; TM not able to be visualized.  Cardiovascular: Normal rate, regular rhythm and normal heart sounds.   Pulmonary/Chest: He has no wheezes. He has no rales.  Distant breath sounds throughout; no tachypnea.  Abdominal: Soft. Bowel sounds are normal. He exhibits no distension. There is no tenderness. There is no rebound and no guarding.  Neurological: He is alert and oriented to person, place, and time.  Skin: Skin is warm and dry. He is not diaphoretic.  Psychiatric: He has a normal mood and affect. His behavior is normal.      Results for orders placed in visit on 12/01/12  POCT CBC      Result Value Range   WBC 7.2  4.6 - 10.2 K/uL   Lymph, poc 1.6  0.6 - 3.4   POC LYMPH PERCENT 22.0  10 - 50 %L   MID (cbc) 0.4  0 - 0.9   POC MID % 6.2  0 - 12 %M   POC Granulocyte 5.2  2 - 6.9   Granulocyte percent 71.8  37 - 80 %G   RBC 3.69 (*) 4.69 - 6.13 M/uL   Hemoglobin 11.6 (*) 14.1 - 18.1 g/dL   HCT, POC 04.5 (*) 40.9 - 53.7 %   MCV 99.9 (*) 80 - 97 fL   MCH, POC 31.4 (*) 27 - 31.2 pg   MCHC 31.4 (*) 31.8 - 35.4 g/dL   RDW, POC 81.1     Platelet Count, POC 376  142 - 424 K/uL   MPV 7.1  0 - 99.8 fL   [facility] reading (PRIMARY) by  Dr. Katrinka Blazing.  CXR:  RUL  INFILTRATE DECREASED IN SIZE.   PPD placed by Rebekah.  L ear irrigated in office.  Assessment & Plan:  Pneumonia - Plan: DG Chest 2 View, POCT SEDIMENTATION RATE  Hyponatremia - Plan: Comprehensive metabolic panel  Anemia, iron deficiency - Plan: POCT CBC  Abnormal CXR - Plan: TB Skin Test  Cerumen impaction, left  1. Community Acquired Pneumonia/RUL infiltrate:  Improving slowly; s/p PPD in office today; to RTC 48-72 hours for read.  Schedule CT if PPD negative. 2.  Hyponatremia: persistent; repeat today. 3. Iron deficiency anemia: unchanged; patient agreeable to increasing iron to bid. 4.  L cerumen impaction: New. S/p irrigation in office.

## 2012-12-01 NOTE — Progress Notes (Signed)
  Tuberculosis Risk Questionnaire  1. No Were you born outside the [place] in one of the following parts of the world: [place], [place], [place], [place] or [place]?  No    2. No Have you traveled outside the [place] and lived for more than one month in one of the following parts of the world: [place], [place], [place], [place] or [place]?    3. No Do you have a compromised immune system such as from any of the following conditions:HIV/AIDS, organ or bone marrow transplantation, diabetes, immunosuppressive medicines (e.g. Prednisone, Remicaide), leukemia, lymphoma, cancer of the head or neck, gastrectomy or jejunal bypass, end-stage renal disease (on dialysis), or silicosis?  No     4. No Have you ever or do you plan on working in: a residential care center, a health care facility, a jail or prison or homeless shelter?  No    5. No Have you ever: injected illegal drugs, used crack cocaine, lived in a homeless shelter  or been in jail or prison?       6. No Have you ever been exposed to anyone with infectious tuberculosis?  No    Tuberculosis Symptom Questionnaire  Do you currently have any of the following symptoms?  1. Yes  Unexplained cough lasting more than 3 weeks?   2. No Unexplained fever lasting more than 3 weeks.   3. No Night Sweats (sweating that leaves the bedclothes and sheets wet)     4. Yes (PT HAS COPD) Shortness of Breath   5. No Chest Pain   6. Yes  Unintentional weight loss    7. No Unexplained fatigue (very tired for no reason)

## 2012-12-02 ENCOUNTER — Encounter: Payer: Self-pay | Admitting: Physician Assistant

## 2012-12-02 DIAGNOSIS — Z860100 Personal history of colon polyps, unspecified: Secondary | ICD-10-CM | POA: Insufficient documentation

## 2012-12-03 ENCOUNTER — Telehealth: Payer: Self-pay | Admitting: Radiology

## 2012-12-03 ENCOUNTER — Ambulatory Visit (INDEPENDENT_AMBULATORY_CARE_PROVIDER_SITE_OTHER): Payer: BC Managed Care – PPO | Admitting: *Deleted

## 2012-12-03 DIAGNOSIS — Z111 Encounter for screening for respiratory tuberculosis: Secondary | ICD-10-CM

## 2012-12-03 LAB — TB SKIN TEST
Induration: 0 mm
TB Skin Test: NEGATIVE

## 2012-12-03 NOTE — Telephone Encounter (Signed)
TB skin test was negative, 0mm induration to you FYI

## 2012-12-04 NOTE — Telephone Encounter (Signed)
Call --- Tb skin test negative; thus, recommend undergoing CT chest.

## 2012-12-04 NOTE — Telephone Encounter (Signed)
Patient advised, will you see if [facility] can call him about having the scan?

## 2012-12-06 ENCOUNTER — Telehealth: Payer: Self-pay

## 2012-12-06 NOTE — Telephone Encounter (Signed)
Returning phone call. Call at [phone].

## 2012-12-07 NOTE — Telephone Encounter (Signed)
PT NOTIFIED OF LABS

## 2012-12-09 ENCOUNTER — Telehealth: Payer: Self-pay | Admitting: Radiology

## 2012-12-09 NOTE — Telephone Encounter (Signed)
To you FYI patient has not had CT scan/ not returning calls to schedule.

## 2012-12-09 NOTE — Telephone Encounter (Signed)
Message copied by Caffie Damme on Mon Dec 09, 2012  4:38 PM ------      Message from: Mare Loan F      Created: Thu Dec 05, 2012  2:17 PM       The patient has been called from [facility] since 11/11/12 and patient will not return calls the last voicemail was 12/03/12 per roberta at [employer]  ------

## 2012-12-11 NOTE — Telephone Encounter (Signed)
Letter sent.

## 2012-12-11 NOTE — Telephone Encounter (Signed)
Please mail letter stating that patient needs to contact office to schedule CT of chest.

## 2012-12-20 ENCOUNTER — Ambulatory Visit
Admission: RE | Admit: 2012-12-20 | Discharge: 2012-12-20 | Disposition: A | Discharge status: Home / Self Care | Payer: BC Managed Care – PPO | Source: Ambulatory Visit | Attending: Family Medicine | Admitting: Family Medicine

## 2012-12-20 DIAGNOSIS — J189 Pneumonia, unspecified organism: Secondary | ICD-10-CM

## 2012-12-20 DIAGNOSIS — J449 Chronic obstructive pulmonary disease, unspecified: Secondary | ICD-10-CM

## 2012-12-20 DIAGNOSIS — R634 Abnormal weight loss: Secondary | ICD-10-CM

## 2012-12-20 DIAGNOSIS — E871 Hypo-osmolality and hyponatremia: Secondary | ICD-10-CM

## 2013-08-24 ENCOUNTER — Other Ambulatory Visit: Payer: Self-pay | Admitting: Family Medicine

## 2013-08-30 ENCOUNTER — Other Ambulatory Visit: Payer: Self-pay | Admitting: Family Medicine

## 2013-09-28 ENCOUNTER — Ambulatory Visit (INDEPENDENT_AMBULATORY_CARE_PROVIDER_SITE_OTHER): Payer: BC Managed Care – PPO | Admitting: Family Medicine

## 2013-09-28 VITALS — BP 90/60 | HR 79 | Temp 97.5°F | Resp 20 | Ht 65.75 in | Wt 99.2 lb

## 2013-09-28 DIAGNOSIS — Z720 Tobacco use: Secondary | ICD-10-CM

## 2013-09-28 DIAGNOSIS — N400 Enlarged prostate without lower urinary tract symptoms: Secondary | ICD-10-CM | POA: Insufficient documentation

## 2013-09-28 DIAGNOSIS — Z23 Encounter for immunization: Secondary | ICD-10-CM

## 2013-09-28 DIAGNOSIS — R4789 Other speech disturbances: Secondary | ICD-10-CM

## 2013-09-28 DIAGNOSIS — R269 Unspecified abnormalities of gait and mobility: Secondary | ICD-10-CM

## 2013-09-28 DIAGNOSIS — S41109A Unspecified open wound of unspecified upper arm, initial encounter: Secondary | ICD-10-CM

## 2013-09-28 DIAGNOSIS — R2681 Unsteadiness on feet: Secondary | ICD-10-CM

## 2013-09-28 DIAGNOSIS — Z0000 Encounter for general adult medical examination without abnormal findings: Secondary | ICD-10-CM

## 2013-09-28 DIAGNOSIS — J449 Chronic obstructive pulmonary disease, unspecified: Secondary | ICD-10-CM

## 2013-09-28 DIAGNOSIS — F17200 Nicotine dependence, unspecified, uncomplicated: Secondary | ICD-10-CM

## 2013-09-28 DIAGNOSIS — R4781 Slurred speech: Secondary | ICD-10-CM

## 2013-09-28 DIAGNOSIS — S41112A Laceration without foreign body of left upper arm, initial encounter: Secondary | ICD-10-CM

## 2013-09-28 LAB — POCT UA - MICROSCOPIC ONLY
Bacteria, U Microscopic: NEGATIVE
Casts, Ur, LPF, POC: NEGATIVE
Crystals, Ur, HPF, POC: NEGATIVE
Mucus, UA: NEGATIVE
WBC, UR, HPF, POC: NEGATIVE
YEAST UA: NEGATIVE

## 2013-09-28 LAB — POCT CBC
Granulocyte percent: 65.1 %G (ref 37–80)
HCT, POC: 41.2 % — AB (ref 43.5–53.7)
HEMOGLOBIN: 13.4 g/dL — AB (ref 14.1–18.1)
Lymph, poc: 2 (ref 0.6–3.4)
MCH: 32.4 pg — AB (ref 27–31.2)
MCHC: 32.5 g/dL (ref 31.8–35.4)
MCV: 99.6 fL — AB (ref 80–97)
MID (CBC): 0.5 (ref 0–0.9)
MPV: 7.5 fL (ref 0–99.8)
PLATELET COUNT, POC: 332 10*3/uL (ref 142–424)
POC Granulocyte: 4.6 (ref 2–6.9)
POC LYMPH PERCENT: 27.5 %L (ref 10–50)
POC MID %: 7.4 % (ref 0–12)
RBC: 4.14 M/uL — AB (ref 4.69–6.13)
RDW, POC: 13.4 %
WBC: 7.1 10*3/uL (ref 4.6–10.2)

## 2013-09-28 LAB — POCT URINALYSIS DIPSTICK
BILIRUBIN UA: NEGATIVE
GLUCOSE UA: NEGATIVE
KETONES UA: NEGATIVE
LEUKOCYTES UA: NEGATIVE
NITRITE UA: NEGATIVE
Protein, UA: NEGATIVE
Spec Grav, UA: 1.01
Urobilinogen, UA: 0.2
pH, UA: 6

## 2013-09-28 LAB — COMPREHENSIVE METABOLIC PANEL
ALK PHOS: 80 U/L (ref 39–117)
ALT: 11 U/L (ref 0–53)
AST: 15 U/L (ref 0–37)
Albumin: 4 g/dL (ref 3.5–5.2)
BILIRUBIN TOTAL: 0.4 mg/dL (ref 0.2–1.2)
BUN: 8 mg/dL (ref 6–23)
CO2: 26 mEq/L (ref 19–32)
Calcium: 8.7 mg/dL (ref 8.4–10.5)
Chloride: 99 mEq/L (ref 96–112)
Creat: 0.75 mg/dL (ref 0.50–1.35)
GLUCOSE: 92 mg/dL (ref 70–99)
Potassium: 4.4 mEq/L (ref 3.5–5.3)
Sodium: 131 mEq/L — ABNORMAL LOW (ref 135–145)
TOTAL PROTEIN: 6.9 g/dL (ref 6.0–8.3)

## 2013-09-28 LAB — TSH: TSH: 1.254 u[IU]/mL (ref 0.350–4.500)

## 2013-09-28 LAB — LIPID PANEL
CHOL/HDL RATIO: 3 ratio
CHOLESTEROL: 159 mg/dL (ref 0–200)
HDL: 53 mg/dL (ref >39)
LDL Cholesterol: 94 mg/dL (ref 0–99)
TRIGLYCERIDES: 61 mg/dL (ref <150)
VLDL: 12 mg/dL (ref 0–40)

## 2013-09-28 MED ORDER — VARENICLINE TARTRATE 1 MG PO TABS: 1.0000 mg | ORAL_TABLET | Freq: Two times a day (BID) | ORAL | Status: DC

## 2013-09-28 MED ORDER — VARENICLINE TARTRATE 0.5 MG X 11 & 1 MG X 42 PO MISC: ORAL | Status: DC

## 2013-09-28 MED ORDER — TAMSULOSIN HCL 0.4 MG PO CAPS: 0.4000 mg | ORAL_CAPSULE | Freq: Every day | ORAL | Status: DC

## 2013-09-28 MED ORDER — BUDESONIDE-FORMOTEROL FUMARATE 160-4.5 MCG/ACT IN AERO: 2.0000 | INHALATION_SPRAY | Freq: Two times a day (BID) | RESPIRATORY_TRACT | Status: DC

## 2013-09-28 MED ORDER — FLUTICASONE PROPIONATE 50 MCG/ACT NA SUSP: 2.0000 | Freq: Every day | NASAL | Status: DC

## 2013-09-28 MED ORDER — TIOTROPIUM BROMIDE MONOHYDRATE 18 MCG IN CAPS: 18.0000 ug | ORAL_CAPSULE | Freq: Every day | RESPIRATORY_TRACT | Status: DC

## 2013-09-28 MED ORDER — ALBUTEROL SULFATE HFA 108 (90 BASE) MCG/ACT IN AERS: 2.0000 | INHALATION_SPRAY | Freq: Four times a day (QID) | RESPIRATORY_TRACT | Status: DC | PRN

## 2013-09-28 NOTE — Progress Notes (Signed)
Subjective:    Patient ID: Warren [name], male    DOB: 06-Dec-1955, 58 y.o.   MRN: [id number]  09/28/2013  Annual Exam   HPI This 58 y.o. male presents for Complete physical examination.  Last physical unsure. Colonoscopy 2014.  Repeat in 5 years; +polyps. Tetanus vaccine unknown.   Pneumovax never. Flu vaccines 2013. Eye exam 08/2013; +cataracts.  Needs surgery on R eye.  Leticia Clas, OD Dental exam no regular.  Lost job in 07/2013; now does not have insurance.  Needs CPE.  Slurred speech: onset in past year; no acute onset; gradual worsening; unsteady gait.  Tremor in hands at end of the day. Slowed reaction times.  Has had 1-2 falls in the past year.  Mother and several of her siblings with spinocerebellar ataxia.  Sister is very worried about patient's falls, tremors, slurred speech.  No previous neurology consultation.  No dysphagia.  No focal weakness; no paresthesias.  COPD: continues to smoke 1 ppd; has never quit; has tried Chantix in the past but was not effective.  Not able to afford Spiriva or Symbicort currently.  Not exercising.  Breathing is worsening; must stop to rest with exertion.    BPH: no longer taking Flomax; does not need Flomax if not taking Spiriva.  Laceration L forearm: cut self on equipment last night; last Tetanus unknown.  Bled for 30 minutes. No n/t/w in L hand or forearm.  Cleansed wound with soap and water immediately after injury.  No swelling.   Review of Systems  HENT: Negative.   Eyes: Negative.   Respiratory: Positive for apnea, cough, chest tightness and wheezing.   Cardiovascular: Negative.   Gastrointestinal: Negative.   Endocrine: Positive for cold intolerance and heat intolerance.  Genitourinary: Positive for frequency.  Musculoskeletal: Positive for myalgias.  Skin: Negative.   Allergic/Immunologic: Positive for environmental allergies.  Neurological: Positive for dizziness, weakness and light-headedness.  Hematological:  Bruises/bleeds easily.  Psychiatric/Behavioral: Negative.     Past Medical History  Diagnosis Date  . Allergic rhinitis   . COPD (chronic obstructive pulmonary disease)   . Testicular atrophy     Right  . Candidal esophagitis     Dr. Benson Norway 10/2012  . Osteoporosis 04/10/2009    L hip fracture; T score -4.2 in 2012.  Fosamax for one year.   Past Surgical History  Procedure Laterality Date  . Nasal fracture surgery    . Vasectomy    . Inguinal hernia repair      bilateral  . Colonoscopy w/ polypectomy  12/07/2006    three polyps sigmoid.  [name]. Repeat 3 years.  . Egd  09/24/2012    hiatal hernia; candidal esophagitis, hematin in stomach; no active source of bleeding.  . Colonoscopy w/ polypectomy  09/24/2012    five sessile polyps removed.  Internal and external hemorrhoids.    Allergies  Allergen Reactions  . Iodine    Current Outpatient Prescriptions  Medication Sig Dispense Refill  . albuterol (PROAIR HFA) 108 (90 BASE) MCG/ACT inhaler Inhale 2 puffs into the lungs every 6 (six) hours as needed.  1 Inhaler  11  . budesonide-formoterol (SYMBICORT) 160-4.5 MCG/ACT inhaler Inhale 2 puffs into the lungs 2 (two) times daily.  10.2 g  11  . Multiple Vitamin (MULTIVITAMIN) tablet Take 1 tablet by mouth daily.        . Calcium-Vitamin D-Vitamin K (CALCIUM SOFT CHEWS PO) 2 chews daily       . fluticasone (FLONASE) 50 MCG/ACT nasal spray  Place 2 sprays into both nostrils daily. 2 sprays in each nostril daily as needed  16 g  11  . omeprazole (PRILOSEC) 40 MG capsule Take 40 mg by mouth daily.      . tamsulosin (FLOMAX) 0.4 MG CAPS capsule Take 1 capsule (0.4 mg total) by mouth daily.  30 capsule  11  . tiotropium (SPIRIVA HANDIHALER) 18 MCG inhalation capsule Place 1 capsule (18 mcg total) into inhaler and inhale daily.  30 capsule  11  . varenicline (CHANTIX CONTINUING MONTH PAK) 1 MG tablet Take 1 tablet (1 mg total) by mouth 2 (two) times daily.  60 tablet  2  . varenicline (CHANTIX  STARTING MONTH PAK) 0.5 MG X 11 & 1 MG X 42 tablet Take one 0.5 mg tablet by mouth once daily for 3 days, then increase to one 0.5 mg tablet twice daily for 4 days, then increase to one 1 mg tablet twice daily.  53 tablet  0   Current Facility-Administered Medications  Medication Dose Route Frequency Provider Last Rate Last Dose  . albuterol (PROVENTIL) (2.5 MG/3ML) 0.083% nebulizer solution 2.5 mg  2.5 mg Nebulization Once Wardell Honour, MD       History   Social History  . Marital Status: Married    Spouse Name: N/A    Number of Children: N/A  . Years of Education: N/A   Occupational History  . 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 Smoking status: Current Every Day Smoker -- 1.50 packs/day for 30 years  . Smokeless tobacco: Not on file     Comment: pt says he is smoking 5 or 6 cigarettes daily  . Alcohol Use: No     Comment: 2 beers monthly  . Drug Use: No  . Sexual Activity: Not on file   Other Topics Concern  . Not on file   Social History Narrative   Marital status: married x 31 years.      Children:  One child (25); no grandchildren.      Lives: with wife, son.  Has a barn; cats.      Employment: unemployed in 2015; previous truck driver x 30 years; hauls beer.  Drives locally     Tobacco: 1 ppd x 30 years      Alcohol:  Rare/none      Drugs: none      Exercise: sporadic      Seatbelt:  100%      Guns:  Unloaded.   Family History  Problem Relation Age of Onset  . Heart disease Father     valve replacement; CHF; heart transplant candidate  . Hypertension Mother   . Hyperlipidemia Mother   . Diabetes Mother   . Stroke Mother 39    cause of death.  . Diabetes Sister   . Hyperlipidemia Sister   . Stroke Brother        Objective:    BP 90/60  Pulse 79  Temp(Src) 97.5 F (36.4 C) (Oral)  Resp 20  Ht 5' 5.75" (1.67 m)  Wt 99 lb 3.2 oz (44.997 kg)  BMI 16.13 kg/m2  SpO2 97% Physical Exam  Constitutional: He is  oriented to person, place, and time. He appears well-developed. He appears cachectic. No distress.  HENT:  Head: Normocephalic and atraumatic.  Right Ear: External ear normal.  Left Ear: External ear normal.  Nose: Nose normal.  Mouth/Throat: Oropharynx is clear and moist.  Eyes: Conjunctivae and EOM are normal. Pupils are equal, round, and reactive to light.  Neck: Normal range of motion. Neck supple. Carotid bruit is not present. No thyromegaly present.  Cardiovascular: Normal rate, regular rhythm, normal heart sounds and intact distal pulses.  Exam reveals no gallop and no friction rub.   No murmur heard. Pulmonary/Chest: Effort normal and breath sounds normal. He has no wheezes. He has no rales.  Abdominal: Soft. Bowel sounds are normal. He exhibits no distension and no mass. There is no tenderness. There is no rebound and no guarding. Hernia confirmed negative in the right inguinal area and confirmed negative in the left inguinal area.  Genitourinary: Rectum normal, prostate normal, testes normal and penis normal. Prostate is not enlarged and not tender. Circumcised.  Musculoskeletal:       Right shoulder: Normal.       Left shoulder: Normal.       Cervical back: Normal.  Lymphadenopathy:    He has no cervical adenopathy.       Right: No inguinal adenopathy present.       Left: No inguinal adenopathy present.  Neurological: He is alert and oriented to person, place, and time. He has normal reflexes. No cranial nerve deficit. He exhibits normal muscle tone. He displays a negative Romberg sign. Coordination normal.  Skin: Skin is warm and dry. No rash noted. He is not diaphoretic.  L forearm with 84mmx3mm laceration with good hemostasis and good approximation.    Psychiatric: He has a normal mood and affect. His behavior is normal. Judgment and thought content normal.   Results for orders placed in visit on 09/28/13  POCT CBC      Result Value Ref Range   WBC 7.1  4.6 - 10.2 K/uL    Lymph, poc 2.0  0.6 - 3.4   POC LYMPH PERCENT 27.5  10 - 50 %L   MID (cbc) 0.5  0 - 0.9   POC MID % 7.4  0 - 12 %M   POC Granulocyte 4.6  2 - 6.9   Granulocyte percent 65.1  37 - 80 %G   RBC 4.14 (*) 4.69 - 6.13 M/uL   Hemoglobin 13.4 (*) 14.1 - 18.1 g/dL   HCT, POC 41.2 (*) 43.5 - 53.7 %   MCV 99.6 (*) 80 - 97 fL   MCH, POC 32.4 (*) 27 - 31.2 pg   MCHC 32.5  31.8 - 35.4 g/dL   RDW, POC 13.4     Platelet Count, POC 332  142 - 424 K/uL   MPV 7.5  0 - 99.8 fL  POCT UA - MICROSCOPIC ONLY      Result Value Ref Range   WBC, Ur, HPF, POC neg     RBC, urine, microscopic 1-3     Bacteria, U Microscopic neg     Mucus, UA neg     Epithelial cells, urine per micros 0-1     Crystals, Ur, HPF, POC neg     Casts, Ur, LPF, POC neg     Yeast, UA neg    POCT URINALYSIS DIPSTICK      Result Value Ref Range   Color, UA yellow     Clarity, UA clear     Glucose, UA neg     Bilirubin, UA neg     Ketones, UA neg     Spec Grav, UA 1.010     Blood, UA small     pH, UA 6.0  Protein, UA neg     Urobilinogen, UA 0.2     Nitrite, UA neg     Leukocytes, UA Negative     TDAP ADMINISTERED BY MICHELLE.    Assessment & Plan:  Routine general medical examination at a health care facility - Plan: POCT CBC, TSH, PSA, POCT UA - Microscopic Only, POCT urinalysis dipstick, Lipid panel, Comprehensive metabolic panel, CANCELED: POCT UA - Microalbumin, CANCELED: COMPLETE METABOLIC PANEL WITH GFR  Chronic obstructive pulmonary disease, unspecified COPD, unspecified chronic bronchitis type - Plan: POCT UA - Microscopic Only, POCT urinalysis dipstick, Lipid panel, Comprehensive metabolic panel  Tobacco abuse - Plan: POCT UA - Microscopic Only, POCT urinalysis dipstick, Lipid panel, Comprehensive metabolic panel  Unsteady gait  Slurred speech  Laceration of left arm with complication, initial encounter  BPH (benign prostatic hyperplasia)  1. Complete Physical Examination:  Anticipatory guidance ---  smoking cessation, regular exercise, start ASA 81mg  daily.  S/p TDAP in office.  Pt declined Pneumovax.  Colonoscopy UTD.  Obtain labs. 2.  COPD severe:  Worsening due to persistent tobacco abuse; encourage smoking cessation; now without insurance and having difficulty affording inhalers; considering applying for disability. 3.  Tobacco abuse:  Contemplative; counseled extensively during visit; rx for Chantix provided; previous use of Chantix without side effects.   4.  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  Pt declined referral to neurology at this time due to lack of insurance.  Encourage patient to undergo consultation. 5.  Slurred speech: New.  Associated with worsening unsteady gait.  Concern as above for cerebellar ataxia. 6.  Laceration of L forearm: New. Pt declined suture repair; s/p TDAP. 7. Anemia: improved from last check. 8. Weight loss unintentional: stable in past year.  No weight loss. 9. Prostate cancer screening: s/p DRE and PSA today. 10.  BPH: stable at this time; refill of Flomax provided.    Meds ordered this encounter  Medications  . varenicline (CHANTIX STARTING MONTH PAK) 0.5 MG X 11 & 1 MG X 42 tablet    Sig: Take one 0.5 mg tablet by mouth once daily for 3 days, then increase to one 0.5 mg tablet twice daily for 4 days, then increase to one 1 mg tablet twice daily.    Dispense:  53 tablet    Refill:  0  . DISCONTD: varenicline (CHANTIX CONTINUING MONTH PAK) 1 MG tablet    Sig: Take 1 tablet (1 mg total) by mouth 2 (two) times daily.    Dispense:  60 tablet    Refill:  2  . albuterol (PROAIR HFA) 108 (90 BASE) MCG/ACT inhaler    Sig: Inhale 2 puffs into the lungs every 6 (six) hours as needed.    Dispense:  1 Inhaler    Refill:  11  . budesonide-formoterol (SYMBICORT) 160-4.5 MCG/ACT inhaler     Sig: Inhale 2 puffs into the lungs 2 (two) times daily.    Dispense:  10.2 g    Refill:  11  . fluticasone (FLONASE) 50 MCG/ACT nasal spray    Sig: Place 2 sprays into both nostrils daily. 2 sprays in each nostril daily as needed    Dispense:  16 g    Refill:  11  . tamsulosin (FLOMAX) 0.4 MG CAPS capsule    Sig: Take 1 capsule (0.4 mg total) by mouth daily.    Dispense:  30 capsule    Refill:  11  . tiotropium (SPIRIVA HANDIHALER) 18 MCG inhalation capsule    Sig: Place 1 capsule (18 mcg total) into inhaler and inhale daily.    Dispense:  30 capsule    Refill:  11  . varenicline (CHANTIX CONTINUING MONTH PAK) 1 MG tablet    Sig: Take 1 tablet (1 mg total) by mouth 2 (two) times daily.    Dispense:  60 tablet    Refill:  2    No Follow-up on file.    [name], M.D.  [facility] [address] [place], [place]  [address] [phone] phone [phone] fax

## 2013-09-28 NOTE — Patient Instructions (Signed)
1. Please all when you are ready to schedule neurology consultation for unsteady gait, speech changes.

## 2013-09-29 LAB — PSA: PSA: 1.91 ng/mL (ref <=4.00)

## 2013-09-30 ENCOUNTER — Encounter: Payer: Self-pay | Admitting: Family Medicine

## 2013-10-15 ENCOUNTER — Encounter: Payer: BC Managed Care – PPO | Admitting: Family Medicine

## 2013-10-25 ENCOUNTER — Other Ambulatory Visit: Payer: Self-pay | Admitting: Family Medicine

## 2014-01-15 ENCOUNTER — Telehealth: Payer: Self-pay | Admitting: *Deleted

## 2014-01-15 DIAGNOSIS — R270 Ataxia, unspecified: Secondary | ICD-10-CM

## 2014-01-15 NOTE — Telephone Encounter (Signed)
Received a fax from the pharmacy- pt is unable to pay for Symbicort. He would like to be prescribed a different inhaler. Please advise.

## 2014-01-15 NOTE — Telephone Encounter (Signed)
Call pharmacy --- does pharmacist know which COPD inhaler patient's insurance will pay for?  Advair?  Spiriva?

## 2014-01-15 NOTE — Telephone Encounter (Signed)
Called pharmacy and they do not know which inhaler is preferred. Called pt who stated that Symbicort is causing his teeth to crack (he was told when he first started using this med that this could be a SE, and he has had a couple of teeth crack. There is also a problem w/insurance, but wants to try a medication that  Would not have the same SE.

## 2014-01-16 ENCOUNTER — Other Ambulatory Visit: Payer: Self-pay | Admitting: Family Medicine

## 2014-01-27 MED ORDER — FLUTICASONE-SALMETEROL 250-50 MCG/DOSE IN AEPB: 1.0000 | INHALATION_SPRAY | Freq: Two times a day (BID) | RESPIRATORY_TRACT | Status: DC

## 2014-01-27 NOTE — Telephone Encounter (Signed)
Lmom to call back. 

## 2014-01-27 NOTE — Telephone Encounter (Signed)
Call patient --- all COPD inhaler medications that are similar to Symbicort have the same risk of teeth cracking; however, COPD tends to warrant medications like Symbicort.  There are no equivalent alternatives.  Smoking also has negative effects/impact on gums and teeth.  I am happy to send in rx for Advair for patient which might be more affordable.  The Advair would replace the Symbicort.

## 2014-01-28 NOTE — Telephone Encounter (Signed)
Spoke to pt- he agrees with change to Advair.   Pt states he is ready to see neurologist. He has noticed an increase is his unsteady gait. He says he has some balance issues. His speech has not changed much but does sound slurred anyhow. I have pended referral. Pt has no preference for specialist.

## 2014-01-28 NOTE — Telephone Encounter (Signed)
Referral to neurology signed.

## 2014-02-11 ENCOUNTER — Other Ambulatory Visit: Payer: Self-pay | Admitting: Physician Assistant

## 2014-02-16 ENCOUNTER — Encounter: Payer: Self-pay | Admitting: Neurology

## 2014-02-16 ENCOUNTER — Ambulatory Visit (INDEPENDENT_AMBULATORY_CARE_PROVIDER_SITE_OTHER): Payer: PRIVATE HEALTH INSURANCE | Admitting: Neurology

## 2014-02-16 VITALS — BP 105/73 | HR 80 | Ht 67.0 in | Wt 104.2 lb

## 2014-02-16 DIAGNOSIS — R471 Dysarthria and anarthria: Secondary | ICD-10-CM | POA: Insufficient documentation

## 2014-02-16 DIAGNOSIS — R269 Unspecified abnormalities of gait and mobility: Secondary | ICD-10-CM

## 2014-02-16 HISTORY — DX: Unspecified abnormalities of gait and mobility: R26.9

## 2014-02-16 HISTORY — DX: Dysarthria and anarthria: R47.1

## 2014-02-16 NOTE — Progress Notes (Signed)
Reason for visit: gait disturbance, dysarthria  Warren [name] is a 58 y.o. male  History of present illness:  Warren [name] is a 58 year old white male with a history of difficulty with dysarthria and walking over the last one year. The problem began with a mild change in his balance that has been progressive over the last year. Within the last 6 months or so, dysarthria has been more problem. The dysarthria is more notable when the patient is fatigued. He reports no numbness or weakness of the extremities, and he does have some frequency of the bladder, no incontinence. The patient denies any difficulty with swallowing or choking. He has not had any change in memory, double vision, or loss of vision. There is a family history of similar problems, the mother had genetically documented SCA6. The patient has 3 other siblings who are younger than he is. They are not affected. The patient comes to this office for further evaluation.  Past Medical History  Diagnosis Date  . Allergic rhinitis   . COPD (chronic obstructive pulmonary disease)   . Testicular atrophy     Right  . Candidal esophagitis     Dr. Benson Norway 10/2012  . Osteoporosis 04/10/2009    L hip fracture; T score -4.2 in 2012.  Fosamax for one year.  . Dysarthria 02/16/2014  . 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 Nasal fracture surgery    . Vasectomy    . Inguinal hernia repair      bilateral  . Colonoscopy w/ polypectomy  12/07/2006    three polyps sigmoid.  [name]. Repeat 3 years.  . Egd  09/24/2012    hiatal hernia; candidal esophagitis, hematin in stomach; no active source of bleeding.  . Colonoscopy w/ polypectomy  09/24/2012    five sessile polyps removed.  Internal and external hemorrhoids.    Family History  Problem Relation Age of Onset  . Heart disease Father     valve replacement; CHF; heart transplant candidate  . Hypertension Mother   . Hyperlipidemia Mother   . Diabetes  Mother   . Stroke Mother 59    cause of death.  . Diabetes Sister   . Hyperlipidemia Sister   . Stroke Brother     Social history:  reports that he has been smoking.  He does not have any smokeless tobacco history on file. He reports that he does not drink alcohol or use illicit drugs.  Medications:  Current Outpatient Prescriptions on File Prior to Visit  Medication Sig Dispense Refill  . fluticasone (FLONASE) 50 MCG/ACT nasal spray Place 2 sprays into both nostrils daily. 2 sprays in each nostril daily as needed 16 g 11  . Fluticasone-Salmeterol (ADVAIR) 250-50 MCG/DOSE AEPB Inhale 1 puff into the lungs 2 (two) times daily. 60 each 11  . Multiple Vitamin (MULTIVITAMIN) tablet Take 1 tablet by mouth daily.      . tamsulosin (FLOMAX) 0.4 MG CAPS capsule TAKE ONE CAPSULE BY MOUTH EVERY DAY 30 capsule 0  . tiotropium (SPIRIVA HANDIHALER) 18 MCG inhalation capsule Place 1 capsule (18 mcg total) into inhaler and inhale daily. 30 capsule 11   Current Facility-Administered Medications on File Prior to Visit  Medication Dose Route Frequency Provider Last Rate Last Dose  . albuterol (PROVENTIL) (2.5 MG/3ML) 0.083% nebulizer solution 2.5 mg  2.5 mg Nebulization Once Wardell Honour, MD          Allergies  Allergen Reactions  .  Iodine     ROS:  Out of a complete 14 system review of symptoms, the patient complains only of the following symptoms, and all other reviewed systems are negative.  Weight loss Shortness of breath, cough, wheezing, snoring Easy bruising Feeling cold Muscle cramps, aching muscles Allergies Weakness, slurred speech Shift work  Blood pressure 105/73, pulse 80, height 5\' 7"  (1.702 m), weight 104 lb 3.2 oz (47.265 kg).  Physical Exam  General: The patient is alert and cooperative at the time of the examination.  Eyes: Pupils are equal, round, and reactive to light. Discs are flat bilaterally.  Neck: The neck is supple, no carotid bruits are  noted.  Respiratory: The respiratory examination is clear.  Cardiovascular: The cardiovascular examination reveals a regular rate and rhythm, no obvious murmurs or rubs are noted.  Skin: Extremities are without significant edema.  Neurologic Exam  Mental status: The patient is alert and oriented x 3 at the time of the examination. The patient has apparent normal recent and remote memory, with an apparently normal attention span and concentration ability.  Cranial nerves: Facial symmetry is present. There is good sensation of the face to pinprick and soft touch bilaterally. The strength of the facial muscles and the muscles to head turning and shoulder shrug are normal bilaterally. Speech is dysarthric, not aphasic. Extraocular movements are full. Visual fields are full. The tongue is midline, and the patient has symmetric elevation of the soft palate. No obvious hearing deficits are noted.  Motor: The motor testing reveals 5 over 5 strength of all 4 extremities. Good symmetric motor tone is noted throughout.  Sensory: Sensory testing is intact to pinprick, soft touch, vibration sensation, and position sense on all 4 extremities, with exception that there is a stocking pattern pinprick sensory deficit one half way up the legs bilaterally. No evidence of extinction is noted.  Coordination: Cerebellar testing reveals good finger-nose-finger and heel-to-shin bilaterally.  Gait and station: Gait is minimally wide-based, the patient is ambulate independently. Tandem gait is unsteady. Romberg is negative. No drift is seen.  Reflexes: Deep tendon reflexes are symmetric and normal in the arms bilaterally. Deep tendon reflexes in the legs are brisk bilaterally. Toes are downgoing bilaterally.   Assessment/Plan:  1. Progressive gait disorder, dysarthria  2. Family history of SCA type 6  The patient likely is developing symptoms that are consistent with SCA type 6. This is a late onset form of  SCA, and may be associated with a peripheral neuropathy. The patient is the oldest of several siblings, and he may find that some his other family members are affected as time goes on. The patient indicates that he has one son. This condition is autosomal dominant. The patient was sent for blood work today, and he will have MRI evaluation of the brain. Given the fact that the mother has been genetically tested, there may be no need for genetic testing in this individual.The patient will follow-up in 4 months.  Warren Alexanders MD 02/16/2014 8:58 PM  [facility] [address] [place], [place] [address]  Phone [phone] Fax [phone]

## 2014-02-16 NOTE — Patient Instructions (Signed)

## 2014-02-18 LAB — TSH: TSH: 2.09 u[IU]/mL (ref 0.450–4.500)

## 2014-02-18 LAB — VITAMIN B12: VITAMIN B 12: 665 pg/mL (ref 211–946)

## 2014-02-18 LAB — GAD-65 AUTOANTIBODY

## 2014-02-18 LAB — COPPER, SERUM: Copper: 123 ug/dL (ref 72–166)

## 2014-02-18 LAB — HIV ANTIBODY (ROUTINE TESTING W REFLEX)
HIV 1/O/2 Abs-Index Value: <1 (ref <1.00)
HIV-1/HIV-2 Ab: NONREACTIVE

## 2014-02-18 LAB — VITAMIN E: Vitamin E (Alpha Tocopherol): 7.8 mg/L (ref 4.6–17.8)

## 2014-02-18 LAB — GLIADIN IGG/IGA AB PROF, EIA
Antigliadin Abs, IgA: 3 units (ref 0–19)
Gliadin IgG: 2 units (ref 0–19)

## 2014-02-18 LAB — ANGIOTENSIN CONVERTING ENZYME: Angio Convert Enzyme: 37 U/L (ref 14–82)

## 2014-02-18 LAB — ANA W/REFLEX: ANA: NEGATIVE

## 2014-02-18 LAB — RPR: RPR: NONREACTIVE

## 2014-02-20 ENCOUNTER — Other Ambulatory Visit: Payer: Self-pay | Admitting: Physician Assistant

## 2014-03-01 ENCOUNTER — Inpatient Hospital Stay: Admission: RE | Admit: 2014-03-01 | Payer: BC Managed Care – PPO | Source: Ambulatory Visit

## 2014-03-08 ENCOUNTER — Ambulatory Visit
Admission: RE | Admit: 2014-03-08 | Discharge: 2014-03-08 | Disposition: A | Discharge status: Home / Self Care | Payer: BC Managed Care – PPO | Source: Ambulatory Visit | Attending: Neurology | Admitting: Neurology

## 2014-03-08 DIAGNOSIS — R471 Dysarthria and anarthria: Secondary | ICD-10-CM

## 2014-03-08 DIAGNOSIS — R269 Unspecified abnormalities of gait and mobility: Secondary | ICD-10-CM

## 2014-03-10 ENCOUNTER — Telehealth: Payer: Self-pay | Admitting: Neurology

## 2014-03-10 NOTE — Telephone Encounter (Signed)
I called the patient. The MRI of the brain shows very minimal white matter changes. 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 I have recommended physical therapy for gait training, the patient wants to "think about it", he will call our office if he desires to initiate this therapy.   MRI brain 03/09/2014:  IMPRESSION:  Abnormal MRI brain (without) demonstrating: 1. Moderate cerebellar atrophy. 2. Scattered, mild periventricular and subcortical chronic small vessel ischemic disease. 3. No acute findings.

## 2014-03-19 ENCOUNTER — Telehealth: Payer: Self-pay | Admitting: Family Medicine

## 2014-03-19 NOTE — Telephone Encounter (Signed)
Patient will come into walk in clinic and get a flu shot.

## 2014-03-22 ENCOUNTER — Ambulatory Visit (INDEPENDENT_AMBULATORY_CARE_PROVIDER_SITE_OTHER): Payer: PRIVATE HEALTH INSURANCE

## 2014-03-22 ENCOUNTER — Ambulatory Visit (INDEPENDENT_AMBULATORY_CARE_PROVIDER_SITE_OTHER): Payer: PRIVATE HEALTH INSURANCE | Admitting: Family Medicine

## 2014-03-22 VITALS — BP 111/72 | HR 97 | Temp 98.8°F | Resp 16 | Ht 65.75 in | Wt 98.5 lb

## 2014-03-22 DIAGNOSIS — D72829 Elevated white blood cell count, unspecified: Secondary | ICD-10-CM | POA: Diagnosis not present

## 2014-03-22 DIAGNOSIS — R05 Cough: Secondary | ICD-10-CM

## 2014-03-22 DIAGNOSIS — D638 Anemia in other chronic diseases classified elsewhere: Secondary | ICD-10-CM

## 2014-03-22 DIAGNOSIS — R059 Cough, unspecified: Secondary | ICD-10-CM

## 2014-03-22 DIAGNOSIS — R0602 Shortness of breath: Secondary | ICD-10-CM

## 2014-03-22 DIAGNOSIS — J441 Chronic obstructive pulmonary disease with (acute) exacerbation: Secondary | ICD-10-CM

## 2014-03-22 LAB — POCT CBC
Granulocyte percent: 79.2 %G (ref 37–80)
HCT, POC: 39.1 % — AB (ref 43.5–53.7)
Hemoglobin: 12.7 g/dL — AB (ref 14.1–18.1)
LYMPH, POC: 2 (ref 0.6–3.4)
MCH: 32.5 pg — AB (ref 27–31.2)
MCHC: 32.5 g/dL (ref 31.8–35.4)
MCV: 99.8 fL — AB (ref 80–97)
MID (CBC): 0.9 (ref 0–0.9)
MPV: 6.3 fL (ref 0–99.8)
PLATELET COUNT, POC: 381 10*3/uL (ref 142–424)
POC Granulocyte: 10.9 — AB (ref 2–6.9)
POC LYMPH %: 14.4 % (ref 10–50)
POC MID %: 6.4 %M (ref 0–12)
RBC: 3.92 M/uL — AB (ref 4.69–6.13)
RDW, POC: 13 %
WBC: 13.8 10*3/uL — AB (ref 4.6–10.2)

## 2014-03-22 MED ORDER — DOXYCYCLINE HYCLATE 100 MG PO CAPS: 100.0000 mg | ORAL_CAPSULE | Freq: Two times a day (BID) | ORAL | Status: AC

## 2014-03-22 MED ORDER — ALBUTEROL SULFATE (2.5 MG/3ML) 0.083% IN NEBU
2.5000 mg | INHALATION_SOLUTION | Freq: Once | RESPIRATORY_TRACT | Status: AC
  Administered 2014-03-22: 2.5 mg via RESPIRATORY_TRACT

## 2014-03-22 MED ORDER — HYDROCODONE-HOMATROPINE 5-1.5 MG/5ML PO SYRP: 5.0000 mL | ORAL_SOLUTION | Freq: Three times a day (TID) | ORAL | Status: DC | PRN

## 2014-03-22 MED ORDER — ALBUTEROL SULFATE HFA 108 (90 BASE) MCG/ACT IN AERS: 2.0000 | INHALATION_SPRAY | RESPIRATORY_TRACT | Status: DC | PRN

## 2014-03-22 MED ORDER — PREDNISONE 20 MG PO TABS: 40.0000 mg | ORAL_TABLET | Freq: Every day | ORAL | Status: AC

## 2014-03-22 MED ORDER — E-Z SPACER DEVI: Status: DC

## 2014-03-22 MED ORDER — IPRATROPIUM BROMIDE 0.02 % IN SOLN
0.5000 mg | Freq: Once | RESPIRATORY_TRACT | Status: AC
  Administered 2014-03-22: 0.5 mg via RESPIRATORY_TRACT

## 2014-03-22 NOTE — Progress Notes (Signed)
IDENTIFYING INFORMATION  Warren [name] / DOB: [birth year] / MRN: [id number]  The patient has COPD (chronic obstructive pulmonary disease); Tobacco abuse; Anemia; Pneumonia; Hyponatremia; Loss of weight; Personal history of colonic polyps; BPH (benign prostatic hyperplasia); Dysarthria; and Gait disorder on his problem list.  SUBJECTIVE  CC: Cough and Shortness of Breath   HPI: Warren [name] is a 58 y.o. y.o. male with a significant history of 60 pack year history and COPD presenting with 5 days of productive cough.  He reports that he has some mild sinus congestion previous to the cough.  He has tried some Mucinex which help move out the mucus.  The cough is keeping him awake. He is positive for chills, sweats, and mild fever.  His COPD is maintained on Spiriva and Advair daily, and he is adherent to this regimen.   He has not received the pneumovax, Prevnar, or seasonal flu shot from the [facility].         He  has a past medical history of Allergic rhinitis; COPD (chronic obstructive pulmonary disease); Testicular atrophy; Candidal esophagitis; Osteoporosis (04/10/2009); Dysarthria (02/16/2014); Gait disorder (02/16/2014); and Abnormal MRI of head.    He has a current medication list which includes the following prescription(s): fluticasone, fluticasone-salmeterol, multivitamin, tamsulosin, and tiotropium, and the following Facility-Administered Medications: albuterol.  Warren [name] is allergic to iodine. He  reports that he has been smoking.  He has never used smokeless tobacco. He reports that he does not drink alcohol or use illicit drugs. He  has no sexual activity history on file.  The patient  has past surgical history that includes Nasal fracture surgery; Vasectomy; Inguinal hernia repair; Colonoscopy w/ polypectomy (12/07/2006); egd (09/24/2012); and Colonoscopy w/ polypectomy (09/24/2012).  His family history includes Diabetes in his mother and sister; Heart disease in his father;  Hyperlipidemia in his mother and sister; Hypertension in his mother; Stroke in his brother; Stroke (age of onset: 60) in his mother.  Review of Systems  Constitutional: Positive for fever, chills, weight loss and diaphoresis.  HENT: Positive for sore throat.   Eyes: Negative.   Respiratory: Positive for cough, sputum production and shortness of breath. Negative for hemoptysis and wheezing.   Cardiovascular: Positive for chest pain (pleuritic in nature).  Gastrointestinal: Negative.   Genitourinary: Negative.   Musculoskeletal: Negative.   Skin: Negative.   Neurological: Positive for weakness. Negative for headaches.  Psychiatric/Behavioral: Negative.     OBJECTIVE  Blood pressure 111/72, pulse 97, temperature 98.8 F (37.1 C), temperature source Oral, resp. rate 16, height 5' 5.75" (1.67 m), weight 98 lb 8 oz (44.679 kg), SpO2 95 %. The patient's body mass index is 16.02 kg/(m^2).  Physical Exam  Constitutional: He appears well-developed and well-nourished. No distress.  HENT:  Mouth/Throat: No oropharyngeal exudate.  Eyes: Right eye exhibits no discharge. Left eye exhibits no discharge. No scleral icterus.  Neck: Normal range of motion. Neck supple.  Cardiovascular: Normal rate, regular rhythm and normal heart sounds.   Respiratory: Accessory muscle usage present. No respiratory distress. He has no decreased breath sounds. He has no wheezes. He has rhonchi (diffuse). He has no rales. Chest wall is not dull to percussion. He exhibits no mass and no tenderness.  GI: Normal appearance. There is no tenderness.  Skin: He is not diaphoretic.     Results for orders placed or performed in visit on 03/22/14 (from the past 24 hour(s))  POCT CBC     Status: Abnormal   Collection  Time: 03/22/14  3:19 PM  Result Value Ref Range   WBC 13.8 (A) 4.6 - 10.2 K/uL   Lymph, poc 2.0 0.6 - 3.4   POC LYMPH PERCENT 14.4 10 - 50 %L   MID (cbc) 0.9 0 - 0.9   POC MID % 6.4 0 - 12 %M   POC  Granulocyte 10.9 (A) 2 - 6.9   Granulocyte percent 79.2 37 - 80 %G   RBC 3.92 (A) 4.69 - 6.13 M/uL   Hemoglobin 12.7 (A) 14.1 - 18.1 g/dL   HCT, POC 39.1 (A) 43.5 - 53.7 %   MCV 99.8 (A) 80 - 97 fL   MCH, POC 32.5 (A) 27 - 31.2 pg   MCHC 32.5 31.8 - 35.4 g/dL   RDW, POC 13.0 %   Platelet Count, POC 381 142 - 424 K/uL   MPV 6.3 0 - 99.8 fL   CURB 65: 0 (low risk of mortality)  [facility] reading (PRIMARY) by  Dr. Brigitte Pulse: No acute changes.  Diffuse interstitial opacities of unknown etiology.     ASSESSMENT & PLAN  Mandeep was seen today for cough and shortness of breath.  Diagnoses and associated orders for this visit:  Cough - DG Chest 2 View; Future - POCT CBC - albuterol (PROVENTIL HFA;VENTOLIN HFA) 108 (90 BASE) MCG/ACT inhaler; Inhale 2 puffs into the lungs every 4 (four) hours as needed for wheezing or shortness of breath (cough, shortness of breath or wheezing.). - Spacer/Aero-Holding Chambers (E-Z SPACER) inhaler; Use as instructed -     HYDROcodone-homatropine (HYCODAN) 5-1.5 MG/5ML syrup; Take 5             mLs by mouth every 8 (eight) hours as needed for cough.  SOB (shortness of breath) - albuterol (PROVENTIL) (2.5 MG/3ML) 0.083% nebulizer solution 2.5 mg; Take 3 mLs (2.5 mg total) by nebulization once. - ipratropium (ATROVENT) nebulizer solution 0.5 mg; Take 2.5 mLs (0.5 mg total) by nebulization once. - DG Chest 2 View; Future - POCT CBC - albuterol (PROVENTIL HFA;VENTOLIN HFA) 108 (90 BASE) MCG/ACT inhaler; Inhale 2 puffs into the lungs every 4 (four) hours as needed for wheezing or shortness of breath (cough, shortness of breath or wheezing.). - Spacer/Aero-Holding Chambers (E-Z SPACER) inhaler; Use as instructed  COPD exacerbation: DDx includes walking pneumonia will await radiologist interpretation. - predniSONE (DELTASONE) 20 MG tablet; Take 2 tablets (40 mg total) by mouth daily with breakfast.   Leukocytosis:  - doxycycline (VIBRAMYCIN) 100 MG capsule; Take 1  capsule (100 mg total) by mouth 2 (two) times daily.  Anemia of chronic disease: -     Appears to be improving relative to last CBC.    The patient was instructed to to call or comeback to clinic as needed, or should symptoms warrant.  Philis Fendt, MHS, PA-C [facility] 03/22/2014 4:18 PM

## 2014-04-10 HISTORY — PX: EYE SURGERY: SHX253

## 2014-06-15 ENCOUNTER — Ambulatory Visit (INDEPENDENT_AMBULATORY_CARE_PROVIDER_SITE_OTHER): Payer: PRIVATE HEALTH INSURANCE | Admitting: Neurology

## 2014-06-15 ENCOUNTER — Encounter: Payer: Self-pay | Admitting: Neurology

## 2014-06-15 VITALS — BP 92/65 | HR 100 | Ht 66.0 in | Wt 102.0 lb

## 2014-06-15 DIAGNOSIS — R471 Dysarthria and anarthria: Secondary | ICD-10-CM

## 2014-06-15 DIAGNOSIS — R269 Unspecified abnormalities of gait and mobility: Secondary | ICD-10-CM

## 2014-06-15 DIAGNOSIS — R1314 Dysphagia, pharyngoesophageal phase: Secondary | ICD-10-CM

## 2014-06-15 DIAGNOSIS — G118 Other hereditary ataxias: Secondary | ICD-10-CM

## 2014-06-15 DIAGNOSIS — I7300 Raynaud's syndrome without gangrene: Secondary | ICD-10-CM

## 2014-06-15 HISTORY — DX: Other hereditary ataxias: G11.8

## 2014-06-15 HISTORY — DX: Dysphagia, pharyngoesophageal phase: R13.14

## 2014-06-15 HISTORY — DX: Raynaud's syndrome without gangrene: I73.00

## 2014-06-15 NOTE — Progress Notes (Signed)
Reason for visit: Spinocerebellar ataxia  Warren [name] is an 59 y.o. male  History of present illness:  Warren [name] is a 59 year old right-handed white male with a history of spinocerebellar ataxia type VI. The patient works as a Administrator, he is actively working. His job does requires heavy lifting. He reports that he continues to have some issues with gait instability, with occasional falls. He reports that he is having a lot of shortness of breath with strenuous physical activity. The patient is a smoker. He also reports worsening dysarthria and some issues with dysphagia. The patient reports that he has mainly problems with solids rather than liquids. He will get the feeling that something is stuck in his throat. He reports that he is also having episodes of blanching of the fingers associated with cold exposure, this can occur on either side. He reports some low back pain. The patient is working 6 days a week, he is rarely at home. Physical therapy was offered for balance training previously, but he could not conform to a schedule to get physical therapy. He returns for an evaluation.  Past Medical History  Diagnosis Date  . Allergic rhinitis   . COPD (chronic obstructive pulmonary disease)   . Testicular atrophy     Right  . Candidal esophagitis     Dr. Benson Norway 10/2012  . Osteoporosis 04/10/2009    L hip fracture; T score -4.2 in 2012.  Fosamax for one year.  . Dysarthria 02/16/2014  . 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 Abnormal MRI of head   . 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 Dysphagia, pharyngoesophageal phase 06/15/2014  . Raynaud disease 06/15/2014    Bilateral hands    Past Surgical History  Procedure Laterality Date  . Nasal fracture surgery    . Vasectomy    . Inguinal hernia repair      bilateral  . Colonoscopy w/ polypectomy  12/07/2006    three polyps sigmoid.  Warren [name]. Repeat 3 years.  . Egd  09/24/2012    hiatal hernia; candidal esophagitis, hematin in stomach; no  active source of bleeding.  . Colonoscopy w/ polypectomy  09/24/2012    five sessile polyps removed.  Internal and external hemorrhoids.  . Cataract extraction      BOTH EYES    Family History  Problem Relation Age of Onset  . Heart disease Father     valve replacement; CHF; heart transplant candidate  . Hypertension Mother   . Hyperlipidemia Mother   . Diabetes Mother   . Stroke Mother 3    cause of death.  . Diabetes Sister   . Hyperlipidemia Sister   . Stroke Brother     Social history:  reports that he has been smoking.  He has never used smokeless tobacco. He reports that he does not drink alcohol or use illicit drugs.    Allergies  Allergen Reactions  . Iodine     Medications:  Prior to Admission medications   Medication Sig Start Date End Date Taking? Authorizing Provider  albuterol (PROVENTIL HFA;VENTOLIN HFA) 108 (90 BASE) MCG/ACT inhaler Inhale 2 puffs into the lungs every 4 (four) hours as needed for wheezing or shortness of breath (cough, shortness of breath or wheezing.). 03/22/14  Yes Kathlen Brunswick, PA-C  fluticasone [facility]) 50 MCG/ACT nasal spray Place 2 sprays into both nostrils daily. 2 sprays in each nostril daily as needed 09/28/13  Yes Wardell Honour, MD  Fluticasone-Salmeterol (ADVAIR) 250-50 MCG/DOSE AEPB Inhale  1 puff into the lungs 2 (two) times daily. 01/27/14  Yes Wardell Honour, MD  Multiple Vitamin (MULTIVITAMIN) tablet Take 1 tablet by mouth daily.     Yes Historical Provider, MD  Spacer/Aero-Holding Chambers (E-Z SPACER) inhaler Use as instructed 03/22/14  Yes Kathlen Brunswick, PA-C  tamsulosin (FLOMAX) 0.4 MG CAPS capsule TAKE ONE CAPSULE BY MOUTH EVERY DAY 01/17/14  Yes Mancel Bale, PA-C  tiotropium (SPIRIVA HANDIHALER) 18 MCG inhalation capsule Place 1 capsule (18 mcg total) into inhaler and inhale daily. 09/28/13  Yes Wardell Honour, MD    ROS:  Out of a complete 14 system review of symptoms, the patient complains only of the  following symptoms, and all other reviewed systems are negative.  Chills, fatigue Drooling Cough, wheezing, shortness of breath, choking, chest tightness Incontinence of the bowels Shift work Frequency of urination Back pain, achy muscles, walking difficulty Itching Dizziness, speech difficulty, weakness  Blood pressure 92/65, pulse 100, height 5\' 6"  (1.676 m), weight 102 lb (46.267 kg).  Physical Exam  General: The patient is alert and cooperative at the time of the examination. The patient is thin.  Skin: No significant peripheral edema is noted.   Neurologic Exam  Mental status: The patient is oriented x 3.  Cranial nerves: Facial symmetry is present. Speech is dysarthric, not aphasic. Extraocular movements are full. Visual fields are full.  Motor: The patient has good strength in all 4 extremities.  Sensory examination: Soft touch sensation is symmetric on the face, arms, and legs.  Coordination: The patient has mild ataxia with finger-nose-finger and heel-to-shin bilaterally..  Gait and station: The patient has a slightly wide-based, ataxic gait. The patient is able to perform tandem gait with minimal instability. Romberg is negative. No drift is seen.  Reflexes: Deep tendon reflexes are symmetric. Knee jerk reflexes are slightly brisk bilaterally.   Assessment/Plan:   1. Spinocerebellar ataxia type VI  2. Gait instability  3. Dysarthria, dysphagia  4. Raynaud phenomenon bilateral hands  5. Dyspnea on exertion  I have recommended the patient seek medical attention concerning the shortness of breath with physical activity. He is a smoker. He has developed Raynaud phenomenon with both hands with cold exposure. This occurs even while wearing gloves. The patient is having difficulty maintaining weight, is not clear that it is related to the dysphagia issue. If the dysphagia becomes severe, an evaluation will be needed. I have recommended physical therapy for his  gait disorder, but the patient does not have a schedule that is amenable to this. Ultimately, the spinocerebellar ataxia is not treatable as it is genetic. The patient will follow-up in one year. He will contact me the depending upon his progression. The patient may be getting to the point where he is unable to continue working at his current job.  Jill Alexanders MD 06/15/2014 10:01 PM  [facility] [address] [place], [place] [address]  Phone [phone] Fax [phone]

## 2014-06-15 NOTE — Patient Instructions (Signed)

## 2014-06-21 ENCOUNTER — Ambulatory Visit (INDEPENDENT_AMBULATORY_CARE_PROVIDER_SITE_OTHER): Payer: PRIVATE HEALTH INSURANCE | Admitting: Family Medicine

## 2014-06-21 ENCOUNTER — Ambulatory Visit (INDEPENDENT_AMBULATORY_CARE_PROVIDER_SITE_OTHER): Payer: PRIVATE HEALTH INSURANCE

## 2014-06-21 VITALS — BP 100/64 | HR 66 | Temp 97.3°F | Resp 20 | Ht 65.75 in | Wt 96.0 lb

## 2014-06-21 DIAGNOSIS — J40 Bronchitis, not specified as acute or chronic: Secondary | ICD-10-CM | POA: Diagnosis not present

## 2014-06-21 DIAGNOSIS — J449 Chronic obstructive pulmonary disease, unspecified: Secondary | ICD-10-CM | POA: Diagnosis not present

## 2014-06-21 DIAGNOSIS — L259 Unspecified contact dermatitis, unspecified cause: Secondary | ICD-10-CM | POA: Diagnosis not present

## 2014-06-21 DIAGNOSIS — J22 Unspecified acute lower respiratory infection: Secondary | ICD-10-CM

## 2014-06-21 DIAGNOSIS — J988 Other specified respiratory disorders: Secondary | ICD-10-CM

## 2014-06-21 LAB — COMPREHENSIVE METABOLIC PANEL
ALK PHOS: 75 U/L (ref 39–117)
ALT: 20 U/L (ref 0–53)
AST: 19 U/L (ref 0–37)
Albumin: 3.9 g/dL (ref 3.5–5.2)
BUN: 6 mg/dL (ref 6–23)
CO2: 26 mEq/L (ref 19–32)
CREATININE: 0.77 mg/dL (ref 0.50–1.35)
Calcium: 9 mg/dL (ref 8.4–10.5)
Chloride: 94 mEq/L — ABNORMAL LOW (ref 96–112)
GLUCOSE: 100 mg/dL — AB (ref 70–99)
Potassium: 4.3 mEq/L (ref 3.5–5.3)
Sodium: 130 mEq/L — ABNORMAL LOW (ref 135–145)
Total Bilirubin: 0.5 mg/dL (ref 0.2–1.2)
Total Protein: 7.1 g/dL (ref 6.0–8.3)

## 2014-06-21 LAB — POCT CBC
Granulocyte percent: 69.7 %G (ref 37–80)
HEMATOCRIT: 41.9 % — AB (ref 43.5–53.7)
HEMOGLOBIN: 13.6 g/dL — AB (ref 14.1–18.1)
Lymph, poc: 1.5 (ref 0.6–3.4)
MCH, POC: 31.5 pg — AB (ref 27–31.2)
MCHC: 32.3 g/dL (ref 31.8–35.4)
MCV: 97.5 fL — AB (ref 80–97)
MID (CBC): 0.5 (ref 0–0.9)
MPV: 6.7 fL (ref 0–99.8)
POC Granulocyte: 4.6 (ref 2–6.9)
POC LYMPH %: 23.3 % (ref 10–50)
POC MID %: 7 %M (ref 0–12)
Platelet Count, POC: 226 10*3/uL (ref 142–424)
RBC: 4.3 M/uL — AB (ref 4.69–6.13)
RDW, POC: 13.9 %
WBC: 6.6 10*3/uL (ref 4.6–10.2)

## 2014-06-21 MED ORDER — ALBUTEROL SULFATE (2.5 MG/3ML) 0.083% IN NEBU
2.5000 mg | INHALATION_SOLUTION | Freq: Once | RESPIRATORY_TRACT | Status: AC
Start: 2014-06-21 — End: 2014-06-21
  Administered 2014-06-21: 2.5 mg via RESPIRATORY_TRACT

## 2014-06-21 MED ORDER — CEFTRIAXONE SODIUM 1 G IJ SOLR
1.0000 g | Freq: Once | INTRAMUSCULAR | Status: AC
  Administered 2014-06-21: 1 g via INTRAMUSCULAR

## 2014-06-21 MED ORDER — METHYLPREDNISOLONE ACETATE 80 MG/ML IJ SUSP
120.0000 mg | Freq: Once | INTRAMUSCULAR | Status: AC
  Administered 2014-06-21: 120 mg via INTRAMUSCULAR

## 2014-06-21 MED ORDER — KETOCONAZOLE 2 % EX CREA: 1.0000 "application " | TOPICAL_CREAM | Freq: Two times a day (BID) | CUTANEOUS | Status: DC

## 2014-06-21 MED ORDER — ALBUTEROL SULFATE (2.5 MG/3ML) 0.083% IN NEBU: 2.5000 mg | INHALATION_SOLUTION | Freq: Four times a day (QID) | RESPIRATORY_TRACT | Status: DC | PRN

## 2014-06-21 NOTE — Patient Instructions (Signed)

## 2014-06-21 NOTE — Progress Notes (Addendum)
Subjective:  This chart was scribed for Warren Forts, MD by Warren [name], ED Scribe. This Patient was seen in room 01 and the patients care was started at 12:47 PM   Patient ID: Warren [name], male    DOB: 03/24/1956, 59 y.o.   MRN: [id number]  06/21/2014  Follow-up   HPI HPI Comments: Warren [name] is a 59 y.o. male with PMHx of COPD who presents to the [facility] for bronchitis follow up. Pt states that his cough began about 1 week ago. Pt states that he went to urgent care in [place], [place] and was placed on z-pack and prednisone that has not provided any relief. Pt states he had associated fever (max temp 100.3), chills, sweats, myalgias, intermittent HA and sore throat from coughing. Pt states that he feels as if his symptoms aren't improving. Pt states that he is having more coughing spells. Pt states that he has been compliant with using his inhalers which also have not provided any relief. Pt states that he is having right sided rib pain brought on from coughing. Denies vomiting, diarrhea.  +DOE throughout the house; continues to smoke.   Pt does not have nebulizer at home.  At Urgent Care in [place], received two nebulizer treatments and oral prednisone.    Pt is also complaining of rash to the left medial aspect of the ankle. Pt states that the rash has been there for 1 year.   Review of Systems  Constitutional: Positive for fever, chills, appetite change and fatigue. Negative for diaphoresis.  HENT: Positive for congestion, rhinorrhea, sore throat and voice change. Negative for ear pain and postnasal drip.   Respiratory: Positive for cough, shortness of breath and wheezing.   Cardiovascular: Positive for chest pain (rib pain brought on from coughing.). Negative for palpitations and leg swelling.  Gastrointestinal: Negative for nausea, vomiting, abdominal pain and diarrhea.  Musculoskeletal: Positive for myalgias.  Skin: Positive for rash.  Neurological: Positive  for dizziness, light-headedness and headaches.    Past Medical History  Diagnosis Date  . Allergic rhinitis   . COPD (chronic obstructive pulmonary disease)   . Testicular atrophy     Right  . Candidal esophagitis     Dr. Benson [name] 10/2012  . Osteoporosis 04/10/2009    L hip fracture; T score -4.2 in 2012.  Fosamax for one year.  . Dysarthria 02/16/2014  . 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 Abnormal MRI of head   . 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 Dysphagia, pharyngoesophageal phase 06/15/2014  . Raynaud disease 06/15/2014    Bilateral hands   Past Surgical History  Procedure Laterality Date  . Nasal fracture surgery    . Vasectomy    . Inguinal hernia repair      bilateral  . Colonoscopy w/ polypectomy  12/07/2006    three polyps sigmoid.  Warren [name]. Repeat 3 years.  . Egd  09/24/2012    hiatal hernia; candidal esophagitis, hematin in stomach; no active source of bleeding.  . Colonoscopy w/ polypectomy  09/24/2012    five sessile polyps removed.  Internal and external hemorrhoids.  . Cataract extraction      BOTH EYES  . Eye surgery  04/10/2014    B cataract surgery   Allergies  Allergen Reactions  . Iodine    Current Outpatient Prescriptions  Medication Sig Dispense Refill  . albuterol (PROVENTIL HFA;VENTOLIN HFA) 108 (90 BASE) MCG/ACT inhaler Inhale 2 puffs into the lungs every 4 (four) hours  as needed for wheezing or shortness of breath (cough, shortness of breath or wheezing.). 1 Inhaler 1  . azithromycin (ZITHROMAX) 250 MG tablet Take 250 mg by mouth as directed.    . fluticasone (FLONASE) 50 MCG/ACT nasal spray Place 2 sprays into both nostrils daily. 2 sprays in each nostril daily as needed 16 g 11  . Fluticasone-Salmeterol (ADVAIR) 250-50 MCG/DOSE AEPB Inhale 1 puff into the lungs 2 (two) times daily. 60 each 11  . Multiple Vitamin (MULTIVITAMIN) tablet Take 1 tablet by mouth daily.      . predniSONE (DELTASONE) 20 MG tablet Take 20 mg by mouth 2 (two) times daily with a meal.     . Spacer/Aero-Holding Chambers (E-Z SPACER) inhaler Use as instructed 1 each 2  . tamsulosin (FLOMAX) 0.4 MG CAPS capsule TAKE ONE CAPSULE BY MOUTH EVERY DAY 30 capsule 0  . tiotropium (SPIRIVA HANDIHALER) 18 MCG inhalation capsule Place 1 capsule (18 mcg total) into inhaler and inhale daily. 30 capsule 11  . albuterol (PROVENTIL) (2.5 MG/3ML) 0.083% nebulizer solution Take 3 mLs (2.5 mg total) by nebulization every 6 (six) hours as needed for wheezing or shortness of breath. 75 mL 12  . ketoconazole (NIZORAL) 2 % cream Apply 1 application topically 2 (two) times daily. 30 g 0   Current Facility-Administered Medications  Medication Dose Route Frequency Provider Last Rate Last Dose  . albuterol (PROVENTIL) (2.5 MG/3ML) 0.083% nebulizer solution 2.5 mg  2.5 mg Nebulization Once Warren Honour, MD           Objective:    BP 100/64 mmHg  Pulse 66  Temp(Src) 97.3 F (36.3 C) (Oral)  Resp 20  Ht 5' 5.75" (1.67 m)  Wt 96 lb (43.545 kg)  BMI 15.61 kg/m2  SpO2 96%   Physical Exam  Constitutional: He is oriented to person, place, and time. He appears well-developed. He appears cachectic. He appears ill. No distress.  Underweight.   HENT:  Head: Normocephalic and atraumatic.  Right Ear: Tympanic membrane, external ear and ear canal normal.  Left Ear: Tympanic membrane, external ear and ear canal normal.  Nose: Nose normal.  Mouth/Throat: Oropharynx is clear and moist.  Eyes: Conjunctivae and EOM are normal. Pupils are equal, round, and reactive to light.  Neck: Neck supple. No tracheal deviation present.  Cardiovascular: Normal rate, regular rhythm and normal heart sounds.  Exam reveals no gallop and no friction rub.   No murmur heard. Pulmonary/Chest: Effort normal. No accessory muscle usage. No respiratory distress. He has decreased breath sounds in the right upper field, the right middle field and the right lower field. He has no wheezes. He has no rhonchi. He has no rales.    decreased breath sounds on right upper and right lower ; +prolonged expiratory phase.  Musculoskeletal: Normal range of motion.  Lymphadenopathy:    He has no cervical adenopathy.  Neurological: He is alert and oriented to person, place, and time.  Skin: Skin is warm and dry. He is not diaphoretic.  Scaling rash with hyperpigmentation of L medial ankle.  Psychiatric: He has a normal mood and affect. His behavior is normal.  Nursing note and vitals reviewed.    Results for orders placed or performed in visit on 06/21/14  POCT CBC  Result Value Ref Range   WBC 6.6 4.6 - 10.2 K/uL   Lymph, poc 1.5 0.6 - 3.4   POC LYMPH PERCENT 23.3 10 - 50 %L   MID (cbc) 0.5 0 - 0.9  POC MID % 7.0 0 - 12 %M   POC Granulocyte 4.6 2 - 6.9   Granulocyte percent 69.7 37 - 80 %G   RBC 4.30 (A) 4.69 - 6.13 M/uL   Hemoglobin 13.6 (A) 14.1 - 18.1 g/dL   HCT, POC 41.9 (A) 43.5 - 53.7 %   MCV 97.5 (A) 80 - 97 fL   MCH, POC 31.5 (A) 27 - 31.2 pg   MCHC 32.3 31.8 - 35.4 g/dL   RDW, POC 13.9 %   Platelet Count, POC 226 142 - 424 K/uL   MPV 6.7 0 - 99.8 fL   [facility] reading (PRIMARY) by  Dr. Tamala Julian.  CXR: hyperexpanded lungs; no infiltrate.  ROCEPHIN 1 GRAM IM, SOLUMEDROL 120MG  IM ADMINISTERED.  ALBUTEROL NEBULIZER ADMINISTERED IN OFFICE.     Assessment & Plan:   1. Chronic obstructive pulmonary disease, unspecified COPD, unspecified chronic bronchitis type   2. Bronchitis   3. Lower respiratory infection   4. Contact dermatitis     -New. -s/p Albuterol nebulizer and Depomedrol in office. -Continue Zpack and Prednisone taper. -Rx for nebulizer and Albuterol solution provided to administer qid scheduled for one week and then PRN.  -Continue Symbicort and Spiriva.   -To ED for acute worsening. -No work for next 72 hours. -Rx for Ketoconazole cream provided to administer to ankle bid for two weeks; if no improvement, will send in topical steroid. -BRAT diet and aggressive hydration for the next 72  hours.    Meds ordered this encounter  Medications  . azithromycin (ZITHROMAX) 250 MG tablet    Sig: Take 250 mg by mouth as directed.  . predniSONE (DELTASONE) 20 MG tablet    Sig: Take 20 mg by mouth 2 (two) times daily with a meal.  . albuterol (PROVENTIL) (2.5 MG/3ML) 0.083% nebulizer solution 2.5 mg    Sig:   . cefTRIAXone (ROCEPHIN) injection 1 g    Sig:     Order Specific Question:  Antibiotic Indication:    Answer:  Other Indication (list below)    Order Specific Question:  Other Indication:    Answer:  COPD/Bronchitis  . methylPREDNISolone acetate (DEPO-MEDROL) injection 120 mg    Sig:   . ketoconazole (NIZORAL) 2 % cream    Sig: Apply 1 application topically 2 (two) times daily.    Dispense:  30 g    Refill:  0  . albuterol (PROVENTIL) (2.5 MG/3ML) 0.083% nebulizer solution    Sig: Take 3 mLs (2.5 mg total) by nebulization every 6 (six) hours as needed for wheezing or shortness of breath.    Dispense:  75 mL    Refill:  12    No Follow-up on file.    I personally performed the services described in this documentation, which was scribed in my presence. The recorded information has been reviewed and considered.  [name] Elayne Guerin, M.D. [facility] [address] [place], [place]  [address] [phone] phone [phone] fax

## 2014-06-22 ENCOUNTER — Telehealth: Payer: Self-pay

## 2014-06-22 NOTE — Telephone Encounter (Signed)
Adrianne called from [facility]. She needs  updated office notes regarding the need for his nebulizer. Please advise at [phone]  ext [phone]. She would also like them faxed to her at [phone].

## 2014-06-23 NOTE — Telephone Encounter (Signed)
Pt called about previous message in the system. He states that [facility] doesn't accept his insurance but the [facility] does and he would like the prescription for the nebulizer faxed there. Their fax number is [phone] and the phone number is [phone]

## 2014-06-25 NOTE — Telephone Encounter (Signed)
New Rx for nebulizer pended. Pleas print and sign. Place in the nurses box when done.

## 2014-06-29 NOTE — Telephone Encounter (Signed)
[facility] called again for the diagnosis codes for Nebulizer.,   Also for the [facility] notes   [phone]

## 2014-06-30 NOTE — Telephone Encounter (Signed)
Please send notes from recent office visit on 06/21/14 to [facility].  This also has the diagnosis codes included (COPD with acute exacerbation).  Also, I have not seen the order for nebulizer in my box.

## 2014-06-30 NOTE — Telephone Encounter (Signed)
Notes sent to [facility].

## 2014-07-01 ENCOUNTER — Telehealth: Payer: Self-pay

## 2014-07-01 ENCOUNTER — Other Ambulatory Visit: Payer: Self-pay

## 2014-07-01 DIAGNOSIS — J449 Chronic obstructive pulmonary disease, unspecified: Secondary | ICD-10-CM

## 2014-07-01 MED ORDER — UNABLE TO FIND: Status: DC

## 2014-07-01 NOTE — Telephone Encounter (Signed)
Patient is calling to speak to Dr. Tamala Julian. Patient only stated that he has a question about last weeks visit. Please call [phone]

## 2014-07-01 NOTE — Telephone Encounter (Signed)
Spoke with pt, he wants to know if she should follow up. He is doing better but still has no energy. He also wants to know how long he should use his nebulizer. Please advise.

## 2014-07-01 NOTE — Telephone Encounter (Addendum)
The patient came into 67 requesting return to work note.  The patient stated that he was given the ok to return last week, but did not feel up to working.  The patient is now feeling better and wants note to return to work.  The patient may be reached at [phone].  The patient is also requesting that the note be be faxed to [phone] as well.

## 2014-07-02 NOTE — Telephone Encounter (Signed)
Letter faxed. Pt notified.

## 2014-07-02 NOTE — Telephone Encounter (Signed)
Spoke with pt, advised message from Dr. Tamala Julian. Pt understood.

## 2014-07-02 NOTE — Telephone Encounter (Signed)
Call ---- 1.  Thank patient for update.  2.  Recommend follow-up today/tonight if possible; I work [employer].  2.  Continue to use nebulizer four times daily for next week atleast; we will discuss further at follow-up visit.

## 2014-07-25 ENCOUNTER — Encounter (HOSPITAL_COMMUNITY): Payer: Self-pay | Admitting: Emergency Medicine

## 2014-07-25 ENCOUNTER — Inpatient Hospital Stay (HOSPITAL_COMMUNITY)
Admission: EM | Admit: 2014-07-25 | Discharge: 2014-07-29 | DRG: 193 | Disposition: A | Discharge status: Home / Self Care | Payer: PRIVATE HEALTH INSURANCE | Attending: Internal Medicine | Admitting: Internal Medicine

## 2014-07-25 ENCOUNTER — Other Ambulatory Visit (HOSPITAL_COMMUNITY): Payer: Self-pay

## 2014-07-25 ENCOUNTER — Emergency Department (HOSPITAL_COMMUNITY): Payer: PRIVATE HEALTH INSURANCE

## 2014-07-25 DIAGNOSIS — I248 Other forms of acute ischemic heart disease: Secondary | ICD-10-CM | POA: Diagnosis present

## 2014-07-25 DIAGNOSIS — J154 Pneumonia due to other streptococci: Secondary | ICD-10-CM | POA: Diagnosis not present

## 2014-07-25 DIAGNOSIS — J9601 Acute respiratory failure with hypoxia: Secondary | ICD-10-CM | POA: Diagnosis present

## 2014-07-25 DIAGNOSIS — J189 Pneumonia, unspecified organism: Secondary | ICD-10-CM

## 2014-07-25 DIAGNOSIS — R079 Chest pain, unspecified: Secondary | ICD-10-CM

## 2014-07-25 DIAGNOSIS — E860 Dehydration: Secondary | ICD-10-CM | POA: Diagnosis present

## 2014-07-25 DIAGNOSIS — Z9842 Cataract extraction status, left eye: Secondary | ICD-10-CM | POA: Diagnosis not present

## 2014-07-25 DIAGNOSIS — D638 Anemia in other chronic diseases classified elsewhere: Secondary | ICD-10-CM | POA: Diagnosis present

## 2014-07-25 DIAGNOSIS — Z889 Allergy status to unspecified drugs, medicaments and biological substances status: Secondary | ICD-10-CM | POA: Diagnosis not present

## 2014-07-25 DIAGNOSIS — N400 Enlarged prostate without lower urinary tract symptoms: Secondary | ICD-10-CM | POA: Diagnosis present

## 2014-07-25 DIAGNOSIS — F17210 Nicotine dependence, cigarettes, uncomplicated: Secondary | ICD-10-CM | POA: Diagnosis present

## 2014-07-25 DIAGNOSIS — R269 Unspecified abnormalities of gait and mobility: Secondary | ICD-10-CM | POA: Diagnosis present

## 2014-07-25 DIAGNOSIS — I7300 Raynaud's syndrome without gangrene: Secondary | ICD-10-CM | POA: Diagnosis present

## 2014-07-25 DIAGNOSIS — E871 Hypo-osmolality and hyponatremia: Secondary | ICD-10-CM | POA: Diagnosis present

## 2014-07-25 DIAGNOSIS — M810 Age-related osteoporosis without current pathological fracture: Secondary | ICD-10-CM | POA: Diagnosis present

## 2014-07-25 DIAGNOSIS — J441 Chronic obstructive pulmonary disease with (acute) exacerbation: Secondary | ICD-10-CM

## 2014-07-25 DIAGNOSIS — Z9841 Cataract extraction status, right eye: Secondary | ICD-10-CM

## 2014-07-25 DIAGNOSIS — J449 Chronic obstructive pulmonary disease, unspecified: Secondary | ICD-10-CM

## 2014-07-25 DIAGNOSIS — Z21 Asymptomatic human immunodeficiency virus [HIV] infection status: Secondary | ICD-10-CM | POA: Diagnosis present

## 2014-07-25 DIAGNOSIS — R0602 Shortness of breath: Secondary | ICD-10-CM | POA: Diagnosis not present

## 2014-07-25 DIAGNOSIS — Z720 Tobacco use: Secondary | ICD-10-CM | POA: Diagnosis present

## 2014-07-25 LAB — BASIC METABOLIC PANEL
ANION GAP: 10 (ref 5–15)
BUN: 5 mg/dL — AB (ref 6–23)
CALCIUM: 8 mg/dL — AB (ref 8.4–10.5)
CO2: 24 mmol/L (ref 19–32)
Chloride: 90 mmol/L — ABNORMAL LOW (ref 96–112)
Creatinine, Ser: 0.74 mg/dL (ref 0.50–1.35)
GFR calc Af Amer: >90 mL/min (ref >90)
GFR calc non Af Amer: >90 mL/min (ref >90)
Glucose, Bld: 223 mg/dL — ABNORMAL HIGH (ref 70–99)
POTASSIUM: 3.7 mmol/L (ref 3.5–5.1)
SODIUM: 124 mmol/L — AB (ref 135–145)

## 2014-07-25 LAB — CBC
HCT: 33.2 % — ABNORMAL LOW (ref 39.0–52.0)
Hemoglobin: 11.6 g/dL — ABNORMAL LOW (ref 13.0–17.0)
MCH: 32.6 pg (ref 26.0–34.0)
MCHC: 34.9 g/dL (ref 30.0–36.0)
MCV: 93.3 fL (ref 78.0–100.0)
PLATELETS: 370 10*3/uL (ref 150–400)
RBC: 3.56 MIL/uL — AB (ref 4.22–5.81)
RDW: 13.9 % (ref 11.5–15.5)
WBC: 13.3 10*3/uL — ABNORMAL HIGH (ref 4.0–10.5)

## 2014-07-25 LAB — I-STAT TROPONIN, ED: Troponin i, poc: 0 ng/mL (ref 0.00–0.08)

## 2014-07-25 LAB — BRAIN NATRIURETIC PEPTIDE: B NATRIURETIC PEPTIDE 5: 163 pg/mL — AB (ref 0.0–100.0)

## 2014-07-25 LAB — TROPONIN I: Troponin I: 0.07 ng/mL — ABNORMAL HIGH (ref <0.031)

## 2014-07-25 LAB — SODIUM, URINE, RANDOM: Sodium, Ur: 57 mmol/L

## 2014-07-25 LAB — OSMOLALITY, URINE: Osmolality, Ur: 328 mOsm/kg — ABNORMAL LOW (ref 390–1090)

## 2014-07-25 MED ORDER — DEXTROSE 5 % IV SOLN
500.0000 mg | INTRAVENOUS | Status: DC
  Administered 2014-07-25 – 2014-07-28 (×4): 500 mg via INTRAVENOUS
  Filled 2014-07-25 (×6): qty 500

## 2014-07-25 MED ORDER — SODIUM CHLORIDE 0.9 % IV SOLN
INTRAVENOUS | Status: DC
  Administered 2014-07-25 – 2014-07-27 (×4): via INTRAVENOUS
  Administered 2014-07-27: 75 mL/h via INTRAVENOUS
  Administered 2014-07-28 – 2014-07-29 (×2): via INTRAVENOUS

## 2014-07-25 MED ORDER — ENOXAPARIN SODIUM 40 MG/0.4ML ~~LOC~~ SOLN
40.0000 mg | SUBCUTANEOUS | Status: DC
  Administered 2014-07-25 – 2014-07-28 (×4): 40 mg via SUBCUTANEOUS
  Filled 2014-07-25 (×5): qty 0.4

## 2014-07-25 MED ORDER — ONDANSETRON HCL 4 MG/2ML IJ SOLN
4.0000 mg | Freq: Four times a day (QID) | INTRAMUSCULAR | Status: DC | PRN
  Filled 2014-07-25: qty 2

## 2014-07-25 MED ORDER — NICOTINE 14 MG/24HR TD PT24
14.0000 mg | MEDICATED_PATCH | Freq: Every day | TRANSDERMAL | Status: DC
  Administered 2014-07-25 – 2014-07-29 (×5): 14 mg via TRANSDERMAL
  Filled 2014-07-25 (×5): qty 1

## 2014-07-25 MED ORDER — ONDANSETRON HCL 4 MG PO TABS: 4.0000 mg | ORAL_TABLET | Freq: Four times a day (QID) | ORAL | Status: DC | PRN

## 2014-07-25 MED ORDER — IPRATROPIUM-ALBUTEROL 0.5-2.5 (3) MG/3ML IN SOLN
3.0000 mL | Freq: Once | RESPIRATORY_TRACT | Status: AC
  Administered 2014-07-25: 3 mL via RESPIRATORY_TRACT
  Filled 2014-07-25: qty 3

## 2014-07-25 MED ORDER — HYDROCODONE-ACETAMINOPHEN 5-325 MG PO TABS
2.0000 | ORAL_TABLET | Freq: Once | ORAL | Status: AC
Start: 2014-07-25 — End: 2014-07-25
  Administered 2014-07-25: 2 via ORAL
  Filled 2014-07-25: qty 2

## 2014-07-25 MED ORDER — FLUTICASONE PROPIONATE 50 MCG/ACT NA SUSP
2.0000 | Freq: Every day | NASAL | Status: DC
  Administered 2014-07-27 – 2014-07-29 (×3): 2 via NASAL
  Filled 2014-07-25 (×2): qty 16

## 2014-07-25 MED ORDER — LEVALBUTEROL HCL 0.63 MG/3ML IN NEBU
0.6300 mg | INHALATION_SOLUTION | Freq: Four times a day (QID) | RESPIRATORY_TRACT | Status: DC
  Administered 2014-07-26: 0.63 mg via RESPIRATORY_TRACT
  Filled 2014-07-25 (×3): qty 3

## 2014-07-25 MED ORDER — LEVALBUTEROL HCL 0.63 MG/3ML IN NEBU
INHALATION_SOLUTION | RESPIRATORY_TRACT | Status: AC
  Filled 2014-07-25: qty 3

## 2014-07-25 MED ORDER — AZITHROMYCIN 500 MG PO TABS
500.0000 mg | ORAL_TABLET | ORAL | Status: DC
  Filled 2014-07-25: qty 1

## 2014-07-25 MED ORDER — TAMSULOSIN HCL 0.4 MG PO CAPS
0.4000 mg | ORAL_CAPSULE | Freq: Every day | ORAL | Status: DC
  Administered 2014-07-26 – 2014-07-29 (×4): 0.4 mg via ORAL
  Filled 2014-07-25 (×4): qty 1

## 2014-07-25 MED ORDER — PREDNISONE 20 MG PO TABS
40.0000 mg | ORAL_TABLET | Freq: Every day | ORAL | Status: DC
  Administered 2014-07-25: 40 mg via ORAL
  Filled 2014-07-25 (×2): qty 2

## 2014-07-25 MED ORDER — DEXTROSE 5 % IV SOLN
1.0000 g | Freq: Once | INTRAVENOUS | Status: AC
  Administered 2014-07-25: 1 g via INTRAVENOUS
  Filled 2014-07-25: qty 10

## 2014-07-25 MED ORDER — SODIUM CHLORIDE 0.9 % IJ SOLN
3.0000 mL | Freq: Two times a day (BID) | INTRAMUSCULAR | Status: DC
  Administered 2014-07-28 – 2014-07-29 (×2): 3 mL via INTRAVENOUS

## 2014-07-25 MED ORDER — DEXTROSE 5 % IV SOLN
1.0000 g | INTRAVENOUS | Status: DC
  Administered 2014-07-26 – 2014-07-29 (×4): 1 g via INTRAVENOUS
  Filled 2014-07-25 (×5): qty 10

## 2014-07-25 MED ORDER — IPRATROPIUM BROMIDE 0.02 % IN SOLN
0.5000 mg | Freq: Four times a day (QID) | RESPIRATORY_TRACT | Status: DC
  Administered 2014-07-26: 0.5 mg via RESPIRATORY_TRACT

## 2014-07-25 MED ORDER — IPRATROPIUM BROMIDE 0.02 % IN SOLN
RESPIRATORY_TRACT | Status: AC
  Filled 2014-07-25: qty 2.5

## 2014-07-25 NOTE — Progress Notes (Signed)
ANTIBIOTIC CONSULT NOTE - INITIAL  Pharmacy Consult for abx Indication: pneumonia  Allergies  Allergen Reactions  . Iodine Hives    Patient Measurements: Height: 5\' 6"  (167.6 cm) Weight: 100 lb 1 oz (45.388 kg) IBW/kg (Calculated) : 63.8 Adjusted Body Weight:   Vital Signs: Temp: 98.4 F (36.9 C) (04/16 1718) Temp Source: Oral (04/16 1718) BP: 100/62 mmHg (04/16 2045) Pulse Rate: 110 (04/16 2045) Intake/Output from previous day:   Intake/Output from this shift:    Labs:  Recent Labs  07/25/14 1720  WBC 13.3*  HGB 11.6*  PLT 370  CREATININE 0.74   Estimated Creatinine Clearance: 63.1 mL/min (by C-G formula based on Cr of 0.74). No results for input(s): VANCOTROUGH, VANCOPEAK, VANCORANDOM, GENTTROUGH, GENTPEAK, GENTRANDOM, TOBRATROUGH, TOBRAPEAK, TOBRARND, AMIKACINPEAK, AMIKACINTROU, AMIKACIN in the last 72 hours.   Microbiology: No results found for this or any previous visit (from the past 720 hour(s)).  Medical History: Past Medical History  Diagnosis Date  . Allergic rhinitis   . COPD (chronic obstructive pulmonary disease)   . Testicular atrophy     Right  . Candidal esophagitis     Dr. Benson Norway 10/2012  . Osteoporosis 04/10/2009    L hip fracture; T score -4.2 in 2012.  Fosamax for one year.  . Dysarthria 02/16/2014  . 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 Abnormal MRI of head   . 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 Dysphagia, pharyngoesophageal phase 06/15/2014  . Raynaud disease 06/15/2014    Bilateral hands    Medications:  Scheduled:  . [START ON 07/26/2014] azithromycin  500 mg Oral Q24H  . enoxaparin (LOVENOX) injection  40 mg Subcutaneous Q24H  . ipratropium  0.5 mg Nebulization Q6H  . levalbuterol  0.63 mg Nebulization Q6H  . nicotine  14 mg Transdermal Daily  . predniSONE  40 mg Oral Q breakfast  . sodium chloride  3 mL Intravenous Q12H   Infusions:  . sodium chloride    . azithromycin    . [START ON 07/26/2014] cefTRIAXone (ROCEPHIN)  IV      Assessment: Starting on rocephin/azith for CAP. No need for renal adjustment for these.   Plan:   Cont zithromax 500mg  IV q24 Cont rocephin 1g IV q24 Rx will sign off  Onnie Boer, PharmD Pager: [phone] 07/25/2014 8:57 PM

## 2014-07-25 NOTE — H&P (Signed)
History and Physical    Warren [name] UUV:[id number] DOB: 1955-07-11 DOA: 07/25/2014  Referring physician: Dr. Rogene [name] PCP: Warren Forts, MD  Specialists: none  Chief Complaint: shortness of breath   HPI: Warren [name] is a 59 y.o. male has a past medical history significant for COPD, tobacco abuse, spinocerebellar ataxia followed by neurology as an outpatient, presents to the emergency room with a chief complaint of shortness of breath. Patient states that over the past 3 days, he has been having increasing dyspnea with exertion, as well as subjective fevers and chills, with a low-grade temperature at home measured at 99, diffuse muscle aches, and a cough that is productive. He also endorses chest pain, on and off for the past couple of days, on the left side of his chest, sometimes on the right side as well. Chest pain is worse with deep breaths as well as cough. He never had chest pain before. He denies wheezing. He endorses lightheadedness and dizziness over the last couple days. He endorses poor by mouth intake and a poor appetite, has had couple of days where he hasn't had much to eat. He denies any abdominal pain, nausea or vomiting. He endorses 1 episode of loose bowel movement earlier today. In the emergency room, his vital signs are showing tachypnea and tachycardia, he is afebrile, he is satting well on 2 L nasal cannula, his blood work shows hyponatremia, elevated BNP as well as a leukocytosis of 13.3. Chest x-ray shows pulmonary consolidation within the left mid and lower lung, concerning for pneumonia. [facility] asked for admission for community-acquired pneumonia.   Review of Systems:  as per history of present illness, otherwise 10 point review of system negative  Past Medical History  Diagnosis Date  . Allergic rhinitis   . COPD (chronic obstructive pulmonary disease)   . Testicular atrophy     Right  . Candidal esophagitis     Warren [name] 10/2012  . Osteoporosis 04/10/2009    L hip  fracture; T score -4.2 in 2012.  Fosamax for one year.  . Dysarthria 02/16/2014  . 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 Abnormal MRI of head   . 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 Dysphagia, pharyngoesophageal phase 06/15/2014  . Raynaud disease 06/15/2014    Bilateral hands   Past Surgical History  Procedure Laterality Date  . Nasal fracture surgery    . Vasectomy    . Inguinal hernia repair      bilateral  . Colonoscopy w/ polypectomy  12/07/2006    three polyps sigmoid.  Warren [name]. Repeat 3 years.  . Egd  09/24/2012    hiatal hernia; candidal esophagitis, hematin in stomach; no active source of bleeding.  . Colonoscopy w/ polypectomy  09/24/2012    five sessile polyps removed.  Internal and external hemorrhoids.  . Cataract extraction      BOTH EYES  . Eye surgery  04/10/2014    B cataract surgery   Social History:  reports that he has been smoking.  He has never used smokeless tobacco. He reports that he does not drink alcohol or use illicit drugs.  Allergies  Allergen Reactions  . Iodine Hives    Family History  Problem Relation Age of Onset  . Heart disease Father     valve replacement; CHF; heart transplant candidate  . Hypertension Mother   . Hyperlipidemia Mother   . Diabetes Mother   . Stroke Mother 79    cause of death.  . Diabetes Sister   .  Hyperlipidemia Sister   . Stroke Brother     Prior to Admission medications   Medication Sig Start Date End Date Taking? Authorizing Provider  albuterol (PROVENTIL HFA;VENTOLIN HFA) 108 (90 BASE) MCG/ACT inhaler Inhale 2 puffs into the lungs every 4 (four) hours as needed for wheezing or shortness of breath (cough, shortness of breath or wheezing.). 03/22/14  Yes Warren Coop, PA-C  albuterol (PROVENTIL) (2.5 MG/3ML) 0.083% nebulizer solution Take 3 mLs (2.5 mg total) by nebulization every 6 (six) hours as needed for wheezing or shortness of breath. 06/21/14  Yes Warren Honour, MD  fluticasone (FLONASE) 50 MCG/ACT nasal spray  Place 2 sprays into both nostrils daily. 2 sprays in each nostril daily as needed 09/28/13  Yes Warren Honour, MD  Fluticasone-Salmeterol (ADVAIR) 250-50 MCG/DOSE AEPB Inhale 1 puff into the lungs 2 (two) times daily. 01/27/14  Yes Warren Honour, MD  ketoconazole (NIZORAL) 2 % cream Apply 1 application topically 2 (two) times daily. 06/21/14  Yes Warren Honour, MD  Multiple Vitamin (MULTIVITAMIN) tablet Take 1 tablet by mouth daily.     Yes Historical Provider, MD  Spacer/Aero-Holding Chambers (E-Z SPACER) inhaler Use as instructed 03/22/14  Yes Warren Coop, PA-C  tamsulosin (FLOMAX) 0.4 MG CAPS capsule TAKE ONE CAPSULE BY MOUTH EVERY DAY 01/17/14  Yes Warren Bale, PA-C  tiotropium (SPIRIVA HANDIHALER) 18 MCG inhalation capsule Place 1 capsule (18 mcg total) into inhaler and inhale daily. 09/28/13  Yes Warren Honour, MD  azithromycin (ZITHROMAX) 250 MG tablet Take 250 mg by mouth as directed.    Historical Provider, MD  predniSONE (DELTASONE) 20 MG tablet Take 20 mg by mouth 2 (two) times daily with a meal.    Historical Provider, MD  UNABLE TO FIND Nebulizer machine DX: J44.9 07/01/14   Warren Bernheim, PA-C   Physical Exam: Filed Vitals:   07/25/14 1720 07/25/14 1730 07/25/14 1915 07/25/14 1931  BP:  94/64 103/67 101/57  Pulse:  25 112 121  Temp:      TempSrc:      Resp:  31 22 22   Height:      Weight: 45.388 kg (100 lb 1 oz)     SpO2:   97% 98%      General:  No apparent distress, cachectic Caucasian male   Eyes: PERRL, EOMI, no scleral icterus  ENT: moist oropharynx  Neck: supple, no lymphadenopathy  Cardiovascular: regular rate without MRG; 2+ peripheral pulses, no JVD, no peripheral edema  Respiratory: overall decreased breath sounds, faint wheezing, no rhonchi   Abdomen: soft, non tender to palpation, positive bowel sounds  Skin: no rashes  Musculoskeletal: normal bulk and tone, no joint swelling  Psychiatric: normal mood and affect  Neurologic: nonfocal    Labs on Admission:  Basic Metabolic Panel:  Recent Labs Lab 07/25/14 1720  NA 124*  K 3.7  CL 90*  CO2 24  GLUCOSE 223*  BUN 5*  CREATININE 0.74  CALCIUM 8.0*   CBC:  Recent Labs Lab 07/25/14 1720  WBC 13.3*  HGB 11.6*  HCT 33.2*  MCV 93.3  PLT 370   Cardiac Enzymes: No results for input(s): CKTOTAL, CKMB, CKMBINDEX, TROPONINI in the last 168 hours.  BNP (last 3 results)  Recent Labs  07/25/14 1720  BNP 163.0*    ProBNP (last 3 results) No results for input(s): PROBNP in the last 8760 hours.  CBG: No results for input(s): GLUCAP in the last 168 hours.  Radiological Exams on  Admission: Dg Chest 2 View (if Patient Has Fever And/or Copd)  07/25/2014   CLINICAL DATA:  Patient with left-sided chest pain, shortness of breath and weakness.  EXAM: CHEST  2 VIEW  COMPARISON:  Chest radiograph 06/21/2014  FINDINGS: Re- demonstrated severe emphysematous change. Stable cardiac and mediastinal contours. Interval development of large area of pulmonary consolidation involving the left mid and lower lung. Persistent right upper lobe scarring. No definite pleural effusion or pneumothorax.  IMPRESSION: Interval development of pulmonary consolidation within the left mid and lower lung, concerning for pneumonia. Recommend short-term followup radiograph in 3-4 weeks to assess for interval resolution.   Electronically Signed   By: Lovey Newcomer M.D.   On: 07/25/2014 18:13    EKG: Independently reviewed. sinus tachycardia.   Assessment/Plan Active Problems:   COPD (chronic obstructive pulmonary disease)   Tobacco abuse   Pneumonia   Hyponatremia   CAP (community acquired pneumonia)   Chest pain  Sepsis due to community-acquired pneumonia - patient with subjective fever and chills at home, productive cough, has leukocytosis, is tachypneic and tachycardic, as well as a chest x-ray showing evidence of pneumonia. We'll start ceftriaxone and azithromycin, obtain blood cultures, HIV,  urine Legionella and strep pneumonia antigens. Obtain sputum cultures. R/o influenza.  Hyponatremia - likely in the setting of dehydration, patient and patient's family tells me that this is not new, obtain serum osmolality, urine osmole as well as urine sodium. IV fluids for now, recheck BMP in the morning.   Acute hypoxic respiratory failure - likely in the setting of #1, oxygen as needed, suspect that he may in fact require oxygen on discharge, probably has a chronic component given COPD as well as ongoing tobacco abuse  Chest pain - patient has never had chest pain like this in the past, we'll cycle troponins as well as obtain a 2-D echo given elevated BNP. PE he is in the differential, however with clinical presentation, leukocytosis and a positive chest x-ray this is less likely.  COPD exacerbation - steroids/nebulizers/antibiotics.  Tobacco abuse - counseled for cessation  BPH - resume Flomax   Diet: heart healthy Fluids: NS DVT Prophylaxis: Lovenox  Code Status: Full  Family Communication: d/w wife and son at bedside  Disposition Plan: admit to telemetry   Solara Goodchild M. Cruzita Lederer, MD [facility] Pager [phone]  If 7PM-7AM, please contact night-coverage [email or web address] Password [facility] 07/25/2014, 8:37 PM

## 2014-07-25 NOTE — ED Notes (Signed)
Called karen, pa, for request for pain medication.

## 2014-07-25 NOTE — ED Provider Notes (Signed)
CSN: [id number]     Arrival date & time 07/25/14  1712 History   First MD Initiated Contact with Patient 07/25/14 1834     Chief Complaint  Patient presents with  . Chest Pain  . Shortness of Breath  . Diarrhea     (Consider location/radiation/quality/duration/timing/severity/associated sxs/prior Treatment) Patient is a 59 y.o. male presenting with chest pain, shortness of breath, and diarrhea. The history is provided by the patient. No language interpreter was used.  Chest Pain Pain location:  L chest Pain quality: aching   Pain radiates to the back: no   Pain severity:  Moderate Onset quality:  Gradual Duration:  3 days Timing:  Constant Progression:  Worsening Chronicity:  New Context: breathing   Relieved by:  Nothing Worsened by:  Nothing tried Associated symptoms: shortness of breath   Risk factors: no high cholesterol   Shortness of Breath Associated symptoms: chest pain   Diarrhea  Pt complains of increasing pain in chest and shortness of breath for 3 days.   Pt has copd.  Pt had bronchitis in feb and pneumonia in December.  Pt reports current illness has knocked him down.  Pt unable to tolerate walking due to shortness of breath.  Pt has had a fever and cough Past Medical History  Diagnosis Date  . Allergic rhinitis   . COPD (chronic obstructive pulmonary disease)   . Testicular atrophy     Right  . Candidal esophagitis     Dr. Benson Norway 10/2012  . Osteoporosis 04/10/2009    L hip fracture; T score -4.2 in 2012.  Fosamax for one year.  . Dysarthria 02/16/2014  . 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 Abnormal MRI of head   . 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 Dysphagia, pharyngoesophageal phase 06/15/2014  . Raynaud disease 06/15/2014    Bilateral hands   Past Surgical History  Procedure Laterality Date  . Nasal fracture surgery    . Vasectomy    . Inguinal hernia repair      bilateral  . Colonoscopy w/ polypectomy  12/07/2006    three polyps sigmoid.  [name]. Repeat  3 years.  . Egd  09/24/2012    hiatal hernia; candidal esophagitis, hematin in stomach; no active source of bleeding.  . Colonoscopy w/ polypectomy  09/24/2012    five sessile polyps removed.  Internal and external hemorrhoids.  . Cataract extraction      BOTH EYES  . Eye surgery  04/10/2014    B cataract surgery   Family History  Problem Relation Age of Onset  . Heart disease Father     valve replacement; CHF; heart transplant candidate  . Hypertension Mother   . Hyperlipidemia Mother   . Diabetes Mother   . Stroke Mother 46    cause of death.  . Diabetes Sister   . Hyperlipidemia Sister   . Stroke Brother    History  Substance Use Topics  . Smoking status: Current Every Day Smoker -- 1.50 packs/day for 30 years  . Smokeless tobacco: Never Used     Comment: pt says he is smoking 5 or 6 cigarettes daily  . Alcohol Use: No     Comment: 2 beers monthly    Review of Systems  Respiratory: Positive for shortness of breath.   Cardiovascular: Positive for chest pain.  Gastrointestinal: Positive for diarrhea.  All other systems reviewed and are negative.     Allergies  Iodine  Home Medications   Prior to Admission medications  Medication Sig Start Date End Date Taking? Authorizing Provider  albuterol (PROVENTIL HFA;VENTOLIN HFA) 108 (90 BASE) MCG/ACT inhaler Inhale 2 puffs into the lungs every 4 (four) hours as needed for wheezing or shortness of breath (cough, shortness of breath or wheezing.). 03/22/14  Yes Tereasa Coop, PA-C  albuterol (PROVENTIL) (2.5 MG/3ML) 0.083% nebulizer solution Take 3 mLs (2.5 mg total) by nebulization every 6 (six) hours as needed for wheezing or shortness of breath. 06/21/14  Yes Wardell Honour, MD  fluticasone (FLONASE) 50 MCG/ACT nasal spray Place 2 sprays into both nostrils daily. 2 sprays in each nostril daily as needed 09/28/13  Yes Wardell Honour, MD  Fluticasone-Salmeterol (ADVAIR) 250-50 MCG/DOSE AEPB Inhale 1 puff into the lungs 2 (two)  times daily. 01/27/14  Yes Wardell Honour, MD  ketoconazole (NIZORAL) 2 % cream Apply 1 application topically 2 (two) times daily. 06/21/14  Yes Wardell Honour, MD  Multiple Vitamin (MULTIVITAMIN) tablet Take 1 tablet by mouth daily.     Yes Historical Provider, MD  Spacer/Aero-Holding Chambers (E-Z SPACER) inhaler Use as instructed 03/22/14  Yes Tereasa Coop, PA-C  tamsulosin (FLOMAX) 0.4 MG CAPS capsule TAKE ONE CAPSULE BY MOUTH EVERY DAY 01/17/14  Yes Mancel Bale, PA-C  tiotropium (SPIRIVA HANDIHALER) 18 MCG inhalation capsule Place 1 capsule (18 mcg total) into inhaler and inhale daily. 09/28/13  Yes Wardell Honour, MD  azithromycin (ZITHROMAX) 250 MG tablet Take 250 mg by mouth as directed.    Historical Provider, MD  predniSONE (DELTASONE) 20 MG tablet Take 20 mg by mouth 2 (two) times daily with a meal.    Historical Provider, MD  UNABLE TO FIND Nebulizer machine DX: J44.9 07/01/14   Tishira R Brewington, PA-C   BP 94/64 mmHg  Pulse 25  Temp(Src) 98.4 F (36.9 C) (Oral)  Resp 31  Ht 5\' 6"  (1.676 m)  Wt 100 lb 1 oz (45.388 kg)  BMI 16.16 kg/m2  SpO2 100% Physical Exam  Constitutional: He is oriented to person, place, and time. He appears well-developed and well-nourished.  HENT:  Head: Normocephalic.  Right Ear: External ear normal.  Left Ear: External ear normal.  Nose: Nose normal.  Mouth/Throat: Oropharynx is clear and moist.  Eyes: EOM are normal. Pupils are equal, round, and reactive to light.  Neck: Normal range of motion.  Pulmonary/Chest: He has wheezes.  Rhonchi,   Abdominal: He exhibits no distension.  Musculoskeletal: Normal range of motion.  Neurological: He is alert and oriented to person, place, and time.  Skin: Skin is warm.  Psychiatric: He has a normal mood and affect.  Nursing note and vitals reviewed.   ED Course  Procedures (including critical care time) Labs Review Labs Reviewed  CBC - Abnormal; Notable for the following:    WBC 13.3 (*)     RBC 3.56 (*)    Hemoglobin 11.6 (*)    HCT 33.2 (*)    All other components within normal limits  BRAIN NATRIURETIC PEPTIDE - Abnormal; Notable for the following:    B Natriuretic Peptide 163.0 (*)    All other components within normal limits  BASIC METABOLIC PANEL  I-STAT TROPOININ, ED    Imaging Review Dg Chest 2 View (if Patient Has Fever And/or Copd)  07/25/2014   CLINICAL DATA:  Patient with left-sided chest pain, shortness of breath and weakness.  EXAM: CHEST  2 VIEW  COMPARISON:  Chest radiograph 06/21/2014  FINDINGS: Re- demonstrated severe emphysematous change. Stable cardiac and mediastinal  contours. Interval development of large area of pulmonary consolidation involving the left mid and lower lung. Persistent right upper lobe scarring. No definite pleural effusion or pneumothorax.  IMPRESSION: Interval development of pulmonary consolidation within the left mid and lower lung, concerning for pneumonia. Recommend short-term followup radiograph in 3-4 weeks to assess for interval resolution.   Electronically Signed   By: Lovey Newcomer M.D.   On: 07/25/2014 18:13     EKG Interpretation   Date/Time:  Saturday July 25 2014 17:16:40 EDT Ventricular Rate:  106 PR Interval:  122 QRS Duration: 76 QT Interval:  332 QTC Calculation: 441 R Axis:   93 Text Interpretation:  Sinus tachycardia Right atrial enlargement Rightward  axis Pulmonary disease pattern Abnormal ECG Artifact Confirmed by  ZACKOWSKI  MD, SCOTT ([id number]) on 07/25/2014 5:57:01 PM      MDM Pt complains of being unable to tolerate ambulating due to shortness of breath.   Chest xray shows pneumonia.  Pt has an elevated wbc count.  Pt given albuterol atrovent neb and rocephin and zithromax.   Final diagnoses:  Community acquired pneumonia  COPD exacerbation    I spoke to [facility].  They are capped for urgent care admissions.  [facility] Dr. Cruzita Lederer will admit   Fransico Meadow, PA-C 07/25/14 [place],  PA-C 07/25/14 2019  Fredia Sorrow, MD 07/26/14 2130

## 2014-07-25 NOTE — ED Notes (Signed)
Admitting at bedside 

## 2014-07-25 NOTE — ED Notes (Signed)
Pt c/o left sided chest pain with shortness of breath and weakness x 3 days. Pain does not radiate.

## 2014-07-25 NOTE — ED Notes (Signed)
Called main lab in regards to bmp results.

## 2014-07-26 ENCOUNTER — Other Ambulatory Visit (HOSPITAL_COMMUNITY): Payer: No Typology Code available for payment source

## 2014-07-26 DIAGNOSIS — E871 Hypo-osmolality and hyponatremia: Secondary | ICD-10-CM

## 2014-07-26 LAB — EXPECTORATED SPUTUM ASSESSMENT W GRAM STAIN, RFLX TO RESP C

## 2014-07-26 LAB — TROPONIN I
TROPONIN I: 0.04 ng/mL — AB (ref <0.031)
Troponin I: 0.04 ng/mL — ABNORMAL HIGH (ref <0.031)

## 2014-07-26 LAB — CBC
HCT: 29.8 % — ABNORMAL LOW (ref 39.0–52.0)
HEMOGLOBIN: 10.3 g/dL — AB (ref 13.0–17.0)
MCH: 32.5 pg (ref 26.0–34.0)
MCHC: 34.6 g/dL (ref 30.0–36.0)
MCV: 94 fL (ref 78.0–100.0)
PLATELETS: 362 10*3/uL (ref 150–400)
RBC: 3.17 MIL/uL — ABNORMAL LOW (ref 4.22–5.81)
RDW: 13.9 % (ref 11.5–15.5)
WBC: 12.8 10*3/uL — ABNORMAL HIGH (ref 4.0–10.5)

## 2014-07-26 LAB — BASIC METABOLIC PANEL
ANION GAP: 9 (ref 5–15)
BUN: 5 mg/dL — ABNORMAL LOW (ref 6–23)
CALCIUM: 7.7 mg/dL — AB (ref 8.4–10.5)
CHLORIDE: 93 mmol/L — AB (ref 96–112)
CO2: 25 mmol/L (ref 19–32)
Creatinine, Ser: 0.66 mg/dL (ref 0.50–1.35)
Glucose, Bld: 176 mg/dL — ABNORMAL HIGH (ref 70–99)
Potassium: 3.8 mmol/L (ref 3.5–5.1)
Sodium: 127 mmol/L — ABNORMAL LOW (ref 135–145)

## 2014-07-26 LAB — INFLUENZA PANEL BY PCR (TYPE A & B)
H1N1 flu by pcr: NOT DETECTED
Influenza A By PCR: NEGATIVE
Influenza B By PCR: NEGATIVE

## 2014-07-26 LAB — D-DIMER, QUANTITATIVE (NOT AT ARMC): D DIMER QUANT: 2.07 ug{FEU}/mL — AB (ref 0.00–0.48)

## 2014-07-26 LAB — STREP PNEUMONIAE URINARY ANTIGEN: STREP PNEUMO URINARY ANTIGEN: POSITIVE — AB

## 2014-07-26 LAB — TSH: TSH: 0.893 u[IU]/mL (ref 0.350–4.500)

## 2014-07-26 LAB — HIV ANTIBODY (ROUTINE TESTING W REFLEX): HIV SCREEN 4TH GENERATION: NONREACTIVE

## 2014-07-26 LAB — EXPECTORATED SPUTUM ASSESSMENT W REFEX TO RESP CULTURE

## 2014-07-26 MED ORDER — IPRATROPIUM BROMIDE 0.02 % IN SOLN
0.5000 mg | Freq: Four times a day (QID) | RESPIRATORY_TRACT | Status: DC
  Administered 2014-07-26 – 2014-07-29 (×11): 0.5 mg via RESPIRATORY_TRACT
  Filled 2014-07-26 (×13): qty 2.5

## 2014-07-26 MED ORDER — GI COCKTAIL ~~LOC~~
30.0000 mL | Freq: Once | ORAL | Status: AC
  Administered 2014-07-26: 30 mL via ORAL
  Filled 2014-07-26: qty 30

## 2014-07-26 MED ORDER — DIPHENHYDRAMINE HCL 50 MG PO CAPS
50.0000 mg | ORAL_CAPSULE | Freq: Once | ORAL | Status: AC
  Administered 2014-07-27: 50 mg via ORAL
  Filled 2014-07-26: qty 2
  Filled 2014-07-26: qty 1

## 2014-07-26 MED ORDER — CETYLPYRIDINIUM CHLORIDE 0.05 % MT LIQD
7.0000 mL | Freq: Two times a day (BID) | OROMUCOSAL | Status: DC
  Administered 2014-07-26 – 2014-07-29 (×6): 7 mL via OROMUCOSAL

## 2014-07-26 MED ORDER — PREDNISONE 50 MG PO TABS
50.0000 mg | ORAL_TABLET | Freq: Four times a day (QID) | ORAL | Status: AC
  Administered 2014-07-26 – 2014-07-27 (×3): 50 mg via ORAL
  Filled 2014-07-26 (×5): qty 1

## 2014-07-26 MED ORDER — LEVALBUTEROL HCL 0.63 MG/3ML IN NEBU
0.6300 mg | INHALATION_SOLUTION | Freq: Four times a day (QID) | RESPIRATORY_TRACT | Status: DC | PRN
  Administered 2014-07-27: 0.63 mg via RESPIRATORY_TRACT
  Filled 2014-07-26: qty 3

## 2014-07-26 MED ORDER — LEVALBUTEROL HCL 0.63 MG/3ML IN NEBU
0.6300 mg | INHALATION_SOLUTION | Freq: Four times a day (QID) | RESPIRATORY_TRACT | Status: DC
  Administered 2014-07-26 – 2014-07-29 (×11): 0.63 mg via RESPIRATORY_TRACT
  Filled 2014-07-26 (×29): qty 3

## 2014-07-26 NOTE — Progress Notes (Signed)
[facility] PROGRESS NOTE  Warren [name] HRC:[id number] DOB: 11-25-1955 DOA: 07/25/2014 PCP: [name], MD Interim summary: 18 y male with h/o copd, gait ataxia, comes in for chest pain and sob. He was found to have left sided pneumonia.  Assessment/Plan: 1. Acute respiratory failure from community acquired pneumonia: requiring 2 lit [place] oxygen.  Admitted to telemetry, started on broad spectrum antibiotics. His urine streptococcal antigen is positive and his influenza pcr is negative. Sputum cultures ordered and pending. Blood cultures not done on admission. IV hydration.   Chest pain: Probably from the pneumonia and tender to palpation. Minimally elevated troponins probably from demand ischemia from infection.  EKG shows sinus tachycardia with right axis deviation probably from his copd disease.  Get echocardiogram and follow up ekg today.   Mild copd exacerbation: Quick taper of steroids and continue with duo nebs treatments.   BPH: Resume flomax.    Leukocytosis: probably from CAP.    Anemia: Normocytic, monitor.    Hyponatremia: Probably from CAP  And dehydration. Improving with IV fluids.  Repeat in am. Serum osmo and tsh pending.   Code Status: full code Family Communication: family at bedside Disposition Plan: pending.    Consultants:  none  Procedures:  none  Antibiotics:  Rocephin and zithromax   HPI/Subjective: Reports chest pain in the left side, worse on palpation.   Objective: Filed Vitals:   07/26/14 0526  BP: 97/58  Pulse: 93  Temp: 97.4 F (36.3 C)  Resp: 20    Intake/Output Summary (Last 24 hours) at 07/26/14 1038 Last data filed at 07/26/14 1016  Gross per 24 hour  Intake     75 ml  Output   2225 ml  Net  -2150 ml   Filed Weights   07/25/14 1720 07/25/14 2122  Weight: 45.388 kg (100 lb 1 oz) 44.453 kg (98 lb)    Exam:   General:  Alert afebrile comfortable  Cardiovascular: s1s2  Respiratory: diminished air entry at  bases, no wheezing heard  Abdomen: soft non tender non distended bowel sounds heard  Musculoskeletal: no pedal edema.   Data Reviewed: Basic Metabolic Panel:  Recent Labs Lab 07/25/14 1720 07/26/14 0550  NA 124* 127*  K 3.7 3.8  CL 90* 93*  CO2 24 25  GLUCOSE 223* 176*  BUN 5* 5*  CREATININE 0.74 0.66  CALCIUM 8.0* 7.7*   Liver Function Tests: No results for input(s): AST, ALT, ALKPHOS, BILITOT, PROT, ALBUMIN in the last 168 hours. No results for input(s): LIPASE, AMYLASE in the last 168 hours. No results for input(s): AMMONIA in the last 168 hours. CBC:  Recent Labs Lab 07/25/14 1720 07/26/14 0550  WBC 13.3* 12.8*  HGB 11.6* 10.3*  HCT 33.2* 29.8*  MCV 93.3 94.0  PLT 370 362   Cardiac Enzymes:  Recent Labs Lab 07/25/14 2152 07/26/14 0550  TROPONINI 0.07* 0.04*   BNP (last 3 results)  Recent Labs  07/25/14 1720  BNP 163.0*    ProBNP (last 3 results) No results for input(s): PROBNP in the last 8760 hours.  CBG: No results for input(s): GLUCAP in the last 168 hours.  Recent Results (from the past 240 hour(s))  Culture, sputum-assessment     Status: None   Collection Time: 07/26/14  2:34 AM  Result Value Ref Range Status   Specimen Description SPUTUM  Final   Special Requests NONE  Final   Sputum evaluation   Final    THIS SPECIMEN IS ACCEPTABLE. RESPIRATORY CULTURE REPORT TO FOLLOW.  Report Status 07/26/2014 FINAL  Final     Studies: Dg Chest 2 View (if Patient Has Fever And/or Copd)  07/25/2014   CLINICAL DATA:  Patient with left-sided chest pain, shortness of breath and weakness.  EXAM: CHEST  2 VIEW  COMPARISON:  Chest radiograph 06/21/2014  FINDINGS: Re- demonstrated severe emphysematous change. Stable cardiac and mediastinal contours. Interval development of large area of pulmonary consolidation involving the left mid and lower lung. Persistent right upper lobe scarring. No definite pleural effusion or pneumothorax.  IMPRESSION: Interval  development of pulmonary consolidation within the left mid and lower lung, concerning for pneumonia. Recommend short-term followup radiograph in 3-4 weeks to assess for interval resolution.   Electronically Signed   By: Lovey Newcomer M.D.   On: 07/25/2014 18:13    Scheduled Meds: . antiseptic oral rinse  7 mL Mouth Rinse BID  . azithromycin  500 mg Intravenous Q24H  . cefTRIAXone (ROCEPHIN)  IV  1 g Intravenous Q24H  . enoxaparin (LOVENOX) injection  40 mg Subcutaneous Q24H  . fluticasone  2 spray Each Nare Daily  . ipratropium  0.5 mg Nebulization QID  . levalbuterol  0.63 mg Nebulization QID  . nicotine  14 mg Transdermal Daily  . predniSONE  40 mg Oral Q breakfast  . sodium chloride  3 mL Intravenous Q12H  . tamsulosin  0.4 mg Oral Daily   Continuous Infusions: . sodium chloride 75 mL/hr at 07/26/14 0606    Active Problems:   COPD (chronic obstructive pulmonary disease)   Tobacco abuse   Pneumonia   Hyponatremia   CAP (community acquired pneumonia)   Chest pain    Time spent: 25 minutes    [facility] Pager [phone] If 7PM-7AM, please contact night-coverage at [email or web address], password [facility] 07/26/2014, 10:38 AM  LOS: 1 day

## 2014-07-26 NOTE — Progress Notes (Signed)
Patient respiratory assessment done and patient scored an 11. Changed to QID for treatments and added a prn for throughout the night if needed. Patient had no wheezes but very diminished. No wheezes post treatment either. Positive PNA on x ray and will reassess In 2 days and per protocol for any changes. Patient currently on O2 @3l /min  for issues related to hypoxia.

## 2014-07-26 NOTE — Progress Notes (Signed)
Pt unable to undergo CT at this time; pt allergic to Iodine; MD paged at this time; will await callback.

## 2014-07-26 NOTE — Progress Notes (Signed)
Pt asymptomatic, pt b/p is 85/62, pt receiving NS at 75 ml/hr, recent troponin was 0.07, MD notified, no new orders placed yet.   Fulton Mole, RN 07/25/14

## 2014-07-26 NOTE — Progress Notes (Signed)
D-dimer 2; MD paged to make aware; will await callback.

## 2014-07-27 ENCOUNTER — Inpatient Hospital Stay (HOSPITAL_COMMUNITY): Payer: PRIVATE HEALTH INSURANCE

## 2014-07-27 LAB — CBC
HCT: 27.6 % — ABNORMAL LOW (ref 39.0–52.0)
HEMOGLOBIN: 9.6 g/dL — AB (ref 13.0–17.0)
MCH: 32.7 pg (ref 26.0–34.0)
MCHC: 34.8 g/dL (ref 30.0–36.0)
MCV: 93.9 fL (ref 78.0–100.0)
Platelets: 404 10*3/uL — ABNORMAL HIGH (ref 150–400)
RBC: 2.94 MIL/uL — ABNORMAL LOW (ref 4.22–5.81)
RDW: 14.1 % (ref 11.5–15.5)
WBC: 12.3 10*3/uL — ABNORMAL HIGH (ref 4.0–10.5)

## 2014-07-27 LAB — OSMOLALITY: Osmolality: 266 mOsm/kg — ABNORMAL LOW (ref 275–300)

## 2014-07-27 LAB — BASIC METABOLIC PANEL
Anion gap: 10 (ref 5–15)
CHLORIDE: 98 mmol/L (ref 96–112)
CO2: 24 mmol/L (ref 19–32)
Calcium: 7.8 mg/dL — ABNORMAL LOW (ref 8.4–10.5)
Creatinine, Ser: 0.56 mg/dL (ref 0.50–1.35)
GFR calc Af Amer: >90 mL/min (ref >90)
Glucose, Bld: 131 mg/dL — ABNORMAL HIGH (ref 70–99)
POTASSIUM: 3.9 mmol/L (ref 3.5–5.1)
Sodium: 132 mmol/L — ABNORMAL LOW (ref 135–145)

## 2014-07-27 LAB — LEGIONELLA ANTIGEN, URINE

## 2014-07-27 LAB — OCCULT BLOOD X 1 CARD TO LAB, STOOL: Fecal Occult Bld: NEGATIVE

## 2014-07-27 MED ORDER — ENSURE ENLIVE PO LIQD
237.0000 mL | Freq: Every day | ORAL | Status: DC
  Administered 2014-07-27 – 2014-07-28 (×2): 237 mL via ORAL

## 2014-07-27 MED ORDER — IOHEXOL 350 MG/ML SOLN
100.0000 mL | Freq: Once | INTRAVENOUS | Status: AC | PRN
  Administered 2014-07-27: 70 mL via INTRAVENOUS

## 2014-07-27 NOTE — Progress Notes (Signed)
Due to pt allergy to contrast media pt scheduled to receive Prednisone 50 mg 13 hours, 7 hours, and 1 hour prior to the radiology procedure, pt first dose of Prednisone was not on unit at scheduled time, pharmacy was notified and medication arrived around 2300, pt given Prednisone 50 mg, radiology staff recently called RN to check on schedule of medication and was notified of delay of first Prednisone dose.  Fulton Mole, [place] 07/27/2014

## 2014-07-27 NOTE — Progress Notes (Signed)
Pt walked to the bathroom and back to bed at 2335, pt began to experience shortness of breath after returning to bed, pt given 0.5 liters of oxygen via nasal cannula, pt attached to portable oxygen saturation monitor reading 97%, 0018 pt  notified RN that SOB worsening, respiratory therapist notified, pt oxygen saturation 97% on 0.5 liters of oxygen via nasal cannula, RN at bedside until respiratory therapist began treatment, pt now resting, call bell within reach and bed in lowest position, RN will continue to monitor.  Fulton Mole, [place] 07/27/2014

## 2014-07-27 NOTE — Progress Notes (Signed)
[facility] PROGRESS NOTE  Warren [name] GEZ:[id number] DOB: November 19, 1955 DOA: 07/25/2014 PCP: [name], MD Interim summary: 23 y male with h/o copd, gait ataxia, comes in for chest pain and sob. He was found to have left sided pneumonia.  Assessment/Plan: 1. Acute respiratory failure from community acquired pneumonia: requiring 2 lit [place] oxygen.  Admitted to telemetry, started on broad spectrum antibiotics. His urine streptococcal antigen is positive and his influenza pcr is negative. Sputum cultures ordered and are negative.  Blood cultures not done on admission. IV hydration. D dimer elevated, followed with CT chest , was negative for pulmonary embolism.   Chest pain: Probably from the pneumonia and tender to palpation. Minimally elevated troponins probably from demand ischemia from infection.  EKG shows sinus tachycardia with right axis deviation probably from his copd disease.  Echocardiogram done and pending.   Mild copd exacerbation: Quick taper of steroids and continue with duo nebs treatments.   BPH: Resume flomax.    Leukocytosis: probably from CAP.    Anemia: Normocytic, monitor. Anemia panel will be sent. Stool for occult blood ordered.    Hyponatremia: Probably from CAP  And dehydration. Improving with IV fluids.  Repeat in am shows much improvement.     Code Status: full code Family Communication: family at bedside Disposition Plan: pending. PT eval.   Consultants:  none  Procedures:  none  Antibiotics:  Rocephin and zithromax   HPI/Subjective: Sob earlier today. Relieved with bronchodilators.   Objective: Filed Vitals:   07/27/14 1731  BP: 105/61  Pulse: 86  Temp: 97.7 F (36.5 C)  Resp: 18    Intake/Output Summary (Last 24 hours) at 07/27/14 1757 Last data filed at 07/27/14 1630  Gross per 24 hour  Intake    840 ml  Output   4100 ml  Net  -3260 ml   Filed Weights   07/25/14 1720 07/25/14 2122  Weight: 45.388 kg (100 lb 1 oz)  44.453 kg (98 lb)    Exam:   General:  Alert afebrile comfortable  Cardiovascular: s1s2  Respiratory: diminished air entry at bases, no wheezing heard  Abdomen: soft non tender non distended bowel sounds heard  Musculoskeletal: no pedal edema.   Data Reviewed: Basic Metabolic Panel:  Recent Labs Lab 07/25/14 1720 07/26/14 0550 07/27/14 0422  NA 124* 127* 132*  K 3.7 3.8 3.9  CL 90* 93* 98  CO2 24 25 24   GLUCOSE 223* 176* 131*  BUN 5* 5* <5*  CREATININE 0.74 0.66 0.56  CALCIUM 8.0* 7.7* 7.8*   Liver Function Tests: No results for input(s): AST, ALT, ALKPHOS, BILITOT, PROT, ALBUMIN in the last 168 hours. No results for input(s): LIPASE, AMYLASE in the last 168 hours. No results for input(s): AMMONIA in the last 168 hours. CBC:  Recent Labs Lab 07/25/14 1720 07/26/14 0550 07/27/14 0422  WBC 13.3* 12.8* 12.3*  HGB 11.6* 10.3* 9.6*  HCT 33.2* 29.8* 27.6*  MCV 93.3 94.0 93.9  PLT 370 362 404*   Cardiac Enzymes:  Recent Labs Lab 07/25/14 2152 07/26/14 0550 07/26/14 1000  TROPONINI 0.07* 0.04* 0.04*   BNP (last 3 results)  Recent Labs  07/25/14 1720  BNP 163.0*    ProBNP (last 3 results) No results for input(s): PROBNP in the last 8760 hours.  CBG: No results for input(s): GLUCAP in the last 168 hours.  Recent Results (from the past 240 hour(s))  Culture, sputum-assessment     Status: None   Collection Time: 07/26/14  2:34 AM  Result Value Ref Range Status   Specimen Description SPUTUM  Final   Special Requests NONE  Final   Sputum evaluation   Final    THIS SPECIMEN IS ACCEPTABLE. RESPIRATORY CULTURE REPORT TO FOLLOW.   Report Status 07/26/2014 FINAL  Final  Culture, respiratory (NON-Expectorated)     Status: None (Preliminary result)   Collection Time: 07/26/14  2:34 AM  Result Value Ref Range Status   Specimen Description SPUTUM  Final   Special Requests NONE  Final   Gram Stain   Final    MODERATE WBC PRESENT,BOTH PMN AND  MONONUCLEAR FEW SQUAMOUS EPITHELIAL CELLS PRESENT MODERATE GRAM POSITIVE COCCI IN CLUSTERS IN PAIRS IN CHAINS Performed at [facility]    Culture   Final    NORMAL OROPHARYNGEAL FLORA Performed at [facility]    Report Status PENDING  Incomplete     Studies: Dg Chest 2 View (if Patient Has Fever And/or Copd)  07/25/2014   CLINICAL DATA:  Patient with left-sided chest pain, shortness of breath and weakness.  EXAM: CHEST  2 VIEW  COMPARISON:  Chest radiograph 06/21/2014  FINDINGS: Re- demonstrated severe emphysematous change. Stable cardiac and mediastinal contours. Interval development of large area of pulmonary consolidation involving the left mid and lower lung. Persistent right upper lobe scarring. No definite pleural effusion or pneumothorax.  IMPRESSION: Interval development of pulmonary consolidation within the left mid and lower lung, concerning for pneumonia. Recommend short-term followup radiograph in 3-4 weeks to assess for interval resolution.   Electronically Signed   By: Lovey Newcomer M.D.   On: 07/25/2014 18:13   Ct Angio Chest Pe W/cm &/or Wo Cm  07/27/2014   CLINICAL DATA:  Shortness of breath and chest pain for 5 days  EXAM: CT ANGIOGRAPHY CHEST WITH CONTRAST  TECHNIQUE: Multidetector CT imaging of the chest was performed using the standard protocol during bolus administration of intravenous contrast. Multiplanar CT image reconstructions and MIPs were obtained to evaluate the vascular anatomy.  CONTRAST:  86mL OMNIPAQUE IOHEXOL 350 MG/ML SOLN  COMPARISON:  06/21/14, 07/25/2014  FINDINGS: Diffuse emphysematous changes are identified bilaterally. Scarring is again seen in the upper lobe on the right with some confluent bullous changes stable from previous chest x-rays. Adjacent bronchiectasis is noted as well. These changes are chronic in appearance. Emphysematous changes are noted on the left. A left-sided pleural effusion is noted. Additionally there is significant  lingular infiltrate similar to that seen on prior plain film examination. This is acute in nature consistent with pneumonia. No other focal infiltrate is seen.  The thoracic inlet is within normal limits. The thoracic aorta and its branches are unremarkable without aneurysmal dilatation or dissection. The pulmonary artery shows a normal branching pattern without evidence of filling defect to suggest pulmonary embolism. No significant hilar or mediastinal adenopathy is noted.  Scanning into the upper abdomen reveals a hiatal hernia. No other definitive abnormality is seen. No acute bony abnormality is noted.  Review of the MIP images confirms the above findings.  IMPRESSION: Chronic changes in the right lung apex.  No evidence of pulmonary emboli.  Lingular pneumonia with associated left-sided pleural effusion.   Electronically Signed   By: Inez Catalina M.D.   On: 07/27/2014 14:43    Scheduled Meds: . antiseptic oral rinse  7 mL Mouth Rinse BID  . azithromycin  500 mg Intravenous Q24H  . cefTRIAXone (ROCEPHIN)  IV  1 g Intravenous Q24H  . enoxaparin (LOVENOX) injection  40 mg Subcutaneous  Q24H  . feeding supplement (ENSURE ENLIVE)  237 mL Oral QPC supper  . fluticasone  2 spray Each Nare Daily  . ipratropium  0.5 mg Nebulization QID  . levalbuterol  0.63 mg Nebulization QID  . nicotine  14 mg Transdermal Daily  . sodium chloride  3 mL Intravenous Q12H  . tamsulosin  0.4 mg Oral Daily   Continuous Infusions: . sodium chloride 75 mL/hr (07/27/14 0845)    Active Problems:   COPD (chronic obstructive pulmonary disease)   Tobacco abuse   Pneumonia   Hyponatremia   CAP (community acquired pneumonia)   Chest pain    Time spent: 25 minutes    [facility] Pager [phone] If 7PM-7AM, please contact night-coverage at [email or web address], password [facility] 07/27/2014, 5:57 PM  LOS: 2 days

## 2014-07-27 NOTE — Progress Notes (Signed)
  Echocardiogram 2D Echocardiogram has been performed.  Darlina Sicilian M 07/27/2014, 12:19 PM

## 2014-07-27 NOTE — Progress Notes (Addendum)
INITIAL NUTRITION ASSESSMENT  DOCUMENTATION CODES Per approved criteria  -Underweight   INTERVENTION: Ensure Enlive po daily, each supplement provides 350 kcal and 20 grams of protein RD to follow for nutrition care plan  NUTRITION DIAGNOSIS: Increased nutrient needs related to acute/chronic illness as evidenced by estimated nutrition needs  Goal: Pt to meet >/= 90% of their estimated nutrition needs   Monitor:  PO & supplemental intake, weight, labs, I/O's  Reason for Assessment: Malnutrition Screening Tool Report  59 y.o. male  Admitting Dx: shortness of breath  ASSESSMENT: 58 y.o. 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    Patient reports a poor appetite for the past several days.  Usually consumes 2 meals per day.  He denies any recent weight loss.  UBW is 110 lbs.  Pt underweight for height.  Reports he's been of smaller size all of his life.  PO intake 100% at lunch today.  Would benefit from additional kcal, protein given acute/chronic illness.  Amenable to Ensure supplements.  RD to order.  RD unable to complete Nutrition Focused Physical Exam at this time.  Height: Ht Readings from Last 1 Encounters:  07/25/14 5\' 6"  (1.676 m)    Weight: Wt Readings from Last 1 Encounters:  07/25/14 98 lb (44.453 kg)    Ideal Body Weight: 142 lb  % Ideal Body Weight: 69%  Wt Readings from Last 10 Encounters:  07/25/14 98 lb (44.453 kg)  06/21/14 96 lb (43.545 kg)  06/15/14 102 lb (46.267 kg)  03/22/14 98 lb 8 oz (44.679 kg)  02/16/14 104 lb 3.2 oz (47.265 kg)  09/28/13 99 lb 3.2 oz (44.997 kg)  12/01/12 101 lb (45.813 kg)  11/03/12 98 lb (44.453 kg)  09/29/12 98 lb 6.4 oz (44.634 kg)  09/08/12 95 lb 6.4 oz (43.273 kg)     Usual Body Weight: 110 lb -- per pt  % Usual Body Weight: 89%  BMI:  Body mass index is 15.83 kg/(m^2).  Estimated Nutritional Needs: Kcal: 1600-1800 Protein: 80-90 gm Fluid: 1.6-1.8 L  Skin: Intact  Diet Order: Heart Healthy  EDUCATION NEEDS: -No education needs identified at this time   Intake/Output Summary (Last 24 hours) at 07/27/14 1516 Last data filed at 07/27/14 1230  Gross per 24 hour  Intake 831.25 ml  Output   3050 ml  Net -2218.75 ml    Labs:   Recent Labs Lab 07/25/14 1720 07/26/14 0550 07/27/14 0422  NA 124* 127* 132*  K 3.7 3.8 3.9  CL 90* 93* 98  CO2 24 25 24   BUN 5* 5* <5*  CREATININE 0.74 0.66 0.56  CALCIUM 8.0* 7.7* 7.8*  GLUCOSE 223* 176* 131*    Scheduled Meds: . antiseptic oral rinse  7 mL Mouth Rinse BID  . azithromycin  500 mg Intravenous Q24H  . cefTRIAXone (ROCEPHIN)  IV  1 g Intravenous Q24H  . enoxaparin (LOVENOX) injection  40 mg Subcutaneous Q24H  . fluticasone  2 spray Each Nare Daily  . ipratropium  0.5 mg Nebulization QID  . levalbuterol  0.63 mg Nebulization QID  . nicotine  14 mg Transdermal Daily  . sodium chloride  3 mL Intravenous Q12H  . tamsulosin  0.4 mg Oral Daily    Continuous Infusions: . sodium chloride 75 mL/hr (07/27/14 0845)  Past Medical History  Diagnosis Date  . Allergic rhinitis   . COPD (chronic obstructive pulmonary disease)   . Testicular atrophy     Right  . Candidal esophagitis     Dr. Benson [place] 10/2012  . Osteoporosis 04/10/2009    L hip fracture; T score -4.2 in 2012.  Fosamax for one year.  . Dysarthria 02/16/2014  . 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 Abnormal MRI of head   . 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 Dysphagia, pharyngoesophageal phase 06/15/2014  . Raynaud disease 06/15/2014    Bilateral hands    Past Surgical History  Procedure Laterality Date  . Nasal fracture surgery    . Vasectomy    . Inguinal hernia repair      bilateral  . Colonoscopy w/ polypectomy  12/07/2006     three polyps sigmoid.  [name]. Repeat 3 years.  . Egd  09/24/2012    hiatal hernia; candidal esophagitis, hematin in stomach; no active source of bleeding.  . Colonoscopy w/ polypectomy  09/24/2012    five sessile polyps removed.  Internal and external hemorrhoids.  . Cataract extraction      BOTH EYES  . Eye surgery  04/10/2014    B cataract surgery    Arthur Holms, RD, LDN Pager #: [phone] After-Hours Pager #: [phone]

## 2014-07-28 LAB — CULTURE, RESPIRATORY

## 2014-07-28 LAB — BASIC METABOLIC PANEL
ANION GAP: 8 (ref 5–15)
BUN: 6 mg/dL (ref 6–23)
CHLORIDE: 98 mmol/L (ref 96–112)
CO2: 28 mmol/L (ref 19–32)
CREATININE: 0.52 mg/dL (ref 0.50–1.35)
Calcium: 8.3 mg/dL — ABNORMAL LOW (ref 8.4–10.5)
Glucose, Bld: 107 mg/dL — ABNORMAL HIGH (ref 70–99)
Potassium: 3.9 mmol/L (ref 3.5–5.1)
Sodium: 134 mmol/L — ABNORMAL LOW (ref 135–145)

## 2014-07-28 LAB — RETICULOCYTES
RBC.: 2.94 MIL/uL — AB (ref 4.22–5.81)
Retic Count, Absolute: 26.5 10*3/uL (ref 19.0–186.0)
Retic Ct Pct: 0.9 % (ref 0.4–3.1)

## 2014-07-28 LAB — IRON AND TIBC
Iron: 78 ug/dL (ref 42–165)
Saturation Ratios: 47 % (ref 20–55)
TIBC: 167 ug/dL — ABNORMAL LOW (ref 215–435)
UIBC: 89 ug/dL — ABNORMAL LOW (ref 125–400)

## 2014-07-28 LAB — FERRITIN: FERRITIN: 239 ng/mL (ref 22–322)

## 2014-07-28 LAB — CBC
HCT: 31.4 % — ABNORMAL LOW (ref 39.0–52.0)
HEMOGLOBIN: 10.7 g/dL — AB (ref 13.0–17.0)
MCH: 32.4 pg (ref 26.0–34.0)
MCHC: 34.1 g/dL (ref 30.0–36.0)
MCV: 95.2 fL (ref 78.0–100.0)
Platelets: 459 10*3/uL — ABNORMAL HIGH (ref 150–400)
RBC: 3.3 MIL/uL — AB (ref 4.22–5.81)
RDW: 14.1 % (ref 11.5–15.5)
WBC: 11.3 10*3/uL — ABNORMAL HIGH (ref 4.0–10.5)

## 2014-07-28 LAB — VITAMIN B12: Vitamin B-12: 1583 pg/mL — ABNORMAL HIGH (ref 211–911)

## 2014-07-28 LAB — CULTURE, RESPIRATORY W GRAM STAIN: Culture: NORMAL

## 2014-07-28 LAB — FOLATE: Folate: 8.7 ng/mL

## 2014-07-28 MED ORDER — GI COCKTAIL ~~LOC~~
30.0000 mL | Freq: Three times a day (TID) | ORAL | Status: DC | PRN
  Filled 2014-07-28 (×2): qty 30

## 2014-07-28 MED ORDER — PREDNISONE 20 MG PO TABS
40.0000 mg | ORAL_TABLET | Freq: Every day | ORAL | Status: DC
  Administered 2014-07-28 – 2014-07-29 (×2): 40 mg via ORAL
  Filled 2014-07-28 (×3): qty 2

## 2014-07-28 MED ORDER — PANTOPRAZOLE SODIUM 40 MG IV SOLR
40.0000 mg | Freq: Once | INTRAVENOUS | Status: AC
  Administered 2014-07-28: 40 mg via INTRAVENOUS
  Filled 2014-07-28: qty 40

## 2014-07-28 MED ORDER — GUAIFENESIN-DM 100-10 MG/5ML PO SYRP
5.0000 mL | ORAL_SOLUTION | ORAL | Status: DC | PRN
  Administered 2014-07-28: 5 mL via ORAL
  Filled 2014-07-28: qty 5

## 2014-07-28 MED ORDER — PANTOPRAZOLE SODIUM 40 MG PO TBEC
40.0000 mg | DELAYED_RELEASE_TABLET | Freq: Every day | ORAL | Status: DC
  Administered 2014-07-29: 40 mg via ORAL
  Filled 2014-07-28: qty 1

## 2014-07-28 MED ORDER — FAMOTIDINE 20 MG PO TABS
20.0000 mg | ORAL_TABLET | Freq: Two times a day (BID) | ORAL | Status: DC | PRN
  Administered 2014-07-28 – 2014-07-29 (×3): 20 mg via ORAL
  Filled 2014-07-28 (×5): qty 1

## 2014-07-28 NOTE — Progress Notes (Signed)
[facility] PROGRESS NOTE  Warren [name] OZD:[id number] DOB: Mar 19, 1956 DOA: 07/25/2014 PCP: [name], MD Interim summary: 59 y male with h/o copd, gait ataxia, comes in for chest pain and sob. He was found to have left sided pneumonia.  Assessment/Plan: 1. Acute respiratory failure from community acquired pneumonia: requiring 2 lit [place] oxygen.  Admitted to telemetry, started on broad spectrum antibiotics. His urine streptococcal antigen is positive and his influenza pcr is negative. Sputum cultures ordered and are negative.  Blood cultures not done on admission. IV hydration. D dimer elevated, followed with CT chest , was negative for pulmonary embolism. Resume IV antibiotics for 24 hours and plan to transition or oral tomorrow.   Chest pain: Probably from the pneumonia and tender to palpation. Minimally elevated troponins probably from demand ischemia from infection.  EKG shows sinus tachycardia with right axis deviation probably from his copd disease.  Echocardiogram done and results pending.  Mild copd exacerbation: Quick taper of steroids and continue with duo nebs treatments.   BPH: Resume flomax.    Leukocytosis: probably from CAP. Repeat WBC count in am.    Anemia: Normocytic, monitor. Anemia panel will be sent. Stool for occult blood ordered and is negative. Anemia panel is pending.     Hyponatremia: Probably from CAP  And dehydration. Improving with IV fluids.  Repeat in am shows much improvement. Repeat inam.   Severe heartburn: One dose of IV protonix, followed by po in am, gi cocktail ordered Monitor.    Code Status: full code Family Communication: family at bedside Disposition Plan: pending. PT eval.   Consultants:  none  Procedures:  none  Antibiotics:  Rocephin and zithromax   HPI/Subjective: Reports he had severe GERD symptoms, and heartburn. Was coughing all night and didn't get enough sleep.   Objective: Filed Vitals:   07/28/14 1025   BP: 106/67  Pulse: 77  Temp:   Resp:     Intake/Output Summary (Last 24 hours) at 07/28/14 1124 Last data filed at 07/28/14 0730  Gross per 24 hour  Intake 4952.5 ml  Output   4650 ml  Net  302.5 ml   Filed Weights   07/25/14 1720 07/25/14 2122  Weight: 45.388 kg (100 lb 1 oz) 44.453 kg (98 lb)    Exam:   General:  Alert afebrile comfortable  Cardiovascular: s1s2  Respiratory: diminished air entry at bases, no wheezing heard  Abdomen: soft non tender non distended bowel sounds heard  Musculoskeletal: no pedal edema.   Data Reviewed: Basic Metabolic Panel:  Recent Labs Lab 07/25/14 1720 07/26/14 0550 07/27/14 0422  NA 124* 127* 132*  K 3.7 3.8 3.9  CL 90* 93* 98  CO2 24 25 24   GLUCOSE 223* 176* 131*  BUN 5* 5* <5*  CREATININE 0.74 0.66 0.56  CALCIUM 8.0* 7.7* 7.8*   Liver Function Tests: No results for input(s): AST, ALT, ALKPHOS, BILITOT, PROT, ALBUMIN in the last 168 hours. No results for input(s): LIPASE, AMYLASE in the last 168 hours. No results for input(s): AMMONIA in the last 168 hours. CBC:  Recent Labs Lab 07/25/14 1720 07/26/14 0550 07/27/14 0422  WBC 13.3* 12.8* 12.3*  HGB 11.6* 10.3* 9.6*  HCT 33.2* 29.8* 27.6*  MCV 93.3 94.0 93.9  PLT 370 362 404*   Cardiac Enzymes:  Recent Labs Lab 07/25/14 2152 07/26/14 0550 07/26/14 1000  TROPONINI 0.07* 0.04* 0.04*   BNP (last 3 results)  Recent Labs  07/25/14 1720  BNP 163.0*    ProBNP (last  3 results) No results for input(s): PROBNP in the last 8760 hours.  CBG: No results for input(s): GLUCAP in the last 168 hours.  Recent Results (from the past 240 hour(s))  Culture, sputum-assessment     Status: None   Collection Time: 07/26/14  2:34 AM  Result Value Ref Range Status   Specimen Description SPUTUM  Final   Special Requests NONE  Final   Sputum evaluation   Final    THIS SPECIMEN IS ACCEPTABLE. RESPIRATORY CULTURE REPORT TO FOLLOW.   Report Status 07/26/2014 FINAL   Final  Culture, respiratory (NON-Expectorated)     Status: None   Collection Time: 07/26/14  2:34 AM  Result Value Ref Range Status   Specimen Description SPUTUM  Final   Special Requests NONE  Final   Gram Stain   Final    MODERATE WBC PRESENT,BOTH PMN AND MONONUCLEAR FEW SQUAMOUS EPITHELIAL CELLS PRESENT MODERATE GRAM POSITIVE COCCI IN CLUSTERS IN PAIRS IN CHAINS Performed at [facility]    Culture   Final    NORMAL OROPHARYNGEAL FLORA Performed at [facility]    Report Status 07/28/2014 FINAL  Final     Studies: Ct Angio Chest Pe W/cm &/or Wo Cm  07/27/2014   CLINICAL DATA:  Shortness of breath and chest pain for 5 days  EXAM: CT ANGIOGRAPHY CHEST WITH CONTRAST  TECHNIQUE: Multidetector CT imaging of the chest was performed using the standard protocol during bolus administration of intravenous contrast. Multiplanar CT image reconstructions and MIPs were obtained to evaluate the vascular anatomy.  CONTRAST:  25mL OMNIPAQUE IOHEXOL 350 MG/ML SOLN  COMPARISON:  06/21/14, 07/25/2014  FINDINGS: Diffuse emphysematous changes are identified bilaterally. Scarring is again seen in the upper lobe on the right with some confluent bullous changes stable from previous chest x-rays. Adjacent bronchiectasis is noted as well. These changes are chronic in appearance. Emphysematous changes are noted on the left. A left-sided pleural effusion is noted. Additionally there is significant lingular infiltrate similar to that seen on prior plain film examination. This is acute in nature consistent with pneumonia. No other focal infiltrate is seen.  The thoracic inlet is within normal limits. The thoracic aorta and its branches are unremarkable without aneurysmal dilatation or dissection. The pulmonary artery shows a normal branching pattern without evidence of filling defect to suggest pulmonary embolism. No significant hilar or mediastinal adenopathy is noted.  Scanning into the upper abdomen  reveals a hiatal hernia. No other definitive abnormality is seen. No acute bony abnormality is noted.  Review of the MIP images confirms the above findings.  IMPRESSION: Chronic changes in the right lung apex.  No evidence of pulmonary emboli.  Lingular pneumonia with associated left-sided pleural effusion.   Electronically Signed   By: Inez Catalina M.D.   On: 07/27/2014 14:43    Scheduled Meds: . antiseptic oral rinse  7 mL Mouth Rinse BID  . azithromycin  500 mg Intravenous Q24H  . cefTRIAXone (ROCEPHIN)  IV  1 g Intravenous Q24H  . enoxaparin (LOVENOX) injection  40 mg Subcutaneous Q24H  . feeding supplement (ENSURE ENLIVE)  237 mL Oral QPC supper  . fluticasone  2 spray Each Nare Daily  . ipratropium  0.5 mg Nebulization QID  . levalbuterol  0.63 mg Nebulization QID  . nicotine  14 mg Transdermal Daily  . [START ON 07/29/2014] pantoprazole  40 mg Oral Daily  . pantoprazole (PROTONIX) IV  40 mg Intravenous Once  . sodium chloride  3 mL Intravenous  Q12H  . tamsulosin  0.4 mg Oral Daily   Continuous Infusions: . sodium chloride 75 mL/hr at 07/27/14 2345    Active Problems:   COPD (chronic obstructive pulmonary disease)   Tobacco abuse   Pneumonia   Hyponatremia   CAP (community acquired pneumonia)   Chest pain    Time spent: 25 minutes    [facility] Pager [phone] If 7PM-7AM, please contact night-coverage at [email or web address], password [facility] 07/28/2014, 11:24 AM  LOS: 3 days

## 2014-07-28 NOTE — Progress Notes (Signed)
Utilization review completed.  

## 2014-07-29 ENCOUNTER — Telehealth: Payer: Self-pay | Admitting: *Deleted

## 2014-07-29 MED ORDER — ENSURE ENLIVE PO LIQD: 237.0000 mL | Freq: Every day | ORAL | Status: DC

## 2014-07-29 MED ORDER — ASPIRIN EC 81 MG PO TBEC: 81.0000 mg | DELAYED_RELEASE_TABLET | Freq: Every day | ORAL | Status: DC

## 2014-07-29 MED ORDER — METHYLPREDNISOLONE 4 MG PO TBPK: ORAL_TABLET | ORAL | Status: DC

## 2014-07-29 MED ORDER — AZITHROMYCIN 250 MG PO TABS: 250.0000 mg | ORAL_TABLET | ORAL | Status: DC

## 2014-07-29 NOTE — Discharge Summary (Signed)
Warren [name], is a 59 y.o. male  DOB 06-23-1955  MRN [id number].  Admission date:  07/25/2014  Admitting Physician  Costin Karlyne Greenspan, MD  Discharge Date:  07/29/2014   Primary MD  [name], MD  Recommendations for primary care physician for things to follow:    Check CBC, BMP and a 2 view chest x-ray next visit on within a week.  One-time outpatient follow-up with cardiology recommended  Admission Diagnosis  Community acquired pneumonia [J18.9] COPD exacerbation [J44.1]   Discharge Diagnosis  Community acquired pneumonia [J18.9] COPD exacerbation [J44.1]     Active Problems:   COPD (chronic obstructive pulmonary disease)   Tobacco abuse   Pneumonia   Hyponatremia   CAP (community acquired pneumonia)   Chest pain      Past Medical History  Diagnosis Date  . Allergic rhinitis   . COPD (chronic obstructive pulmonary disease)   . Testicular atrophy     Right  . Candidal esophagitis     Dr. Benson Norway 10/2012  . Osteoporosis 04/10/2009    L hip fracture; T score -4.2 in 2012.  Fosamax for one year.  . Dysarthria 02/16/2014  . 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 Abnormal MRI of head   . 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 Dysphagia, pharyngoesophageal phase 06/15/2014  . Raynaud disease 06/15/2014    Bilateral hands    Past Surgical History  Procedure Laterality Date  . Nasal fracture surgery    . Vasectomy    . Inguinal hernia repair      bilateral  . Colonoscopy w/ polypectomy  12/07/2006    three polyps sigmoid.  Warren [name]. Repeat 3 years.  . Egd  09/24/2012    hiatal hernia; candidal esophagitis, hematin in stomach; no active source of bleeding.  . Colonoscopy w/ polypectomy  09/24/2012    five sessile polyps removed.  Internal and external hemorrhoids.  . Cataract extraction      BOTH EYES  . Eye  surgery  04/10/2014    B cataract surgery       History of present illness and  Hospital Course:     Kindly see H&P for history of present illness and admission details, please review complete Labs, Consult reports and Test reports for all details in brief  HPI  from the history and physical done on the day of admission  Warren [name] is a 59 y.o. male has a past medical history significant for COPD, tobacco abuse, spinocerebellar ataxia followed by neurology as an outpatient, presents to the emergency room with a chief complaint of shortness of breath. Patient states that over the past 3 days, he has been having increasing dyspnea with exertion, as well as subjective fevers and chills, with a low-grade temperature at home measured at 99, diffuse muscle aches, and a cough that is productive. He also endorses chest pain, on and off for the past couple of days, on the left side of his chest, sometimes on the right side as well. Chest pain is worse with  deep breaths as well as cough. He never had chest pain before. He denies wheezing. He endorses lightheadedness and dizziness over the last couple days. He endorses poor by mouth intake and a poor appetite, has had couple of days where he hasn't had much to eat. He denies any abdominal pain, nausea or vomiting. He endorses 1 episode of loose bowel movement earlier today. In the emergency room, his vital signs are showing tachypnea and tachycardia, he is afebrile, he is satting well on 2 L nasal cannula, his blood work shows hyponatremia, elevated BNP as well as a leukocytosis of 13.3. Chest x-ray shows pulmonary consolidation within the left mid and lower lung, concerning for pneumonia. [facility] asked for admission for community-acquired pneumonia.    Hospital Course   1. Acute hypoxic respiratory failure secondary to strep pneumonia community-acquired pneumonia. Was treated with empiric IV antibiotics, nebulizer treatments and oxygen. He is improved and is back  to baseline. No oxygen demand, completely symptom free eager to go home. Urinary antigen positive for strep pneumonia. Will be placed on azithromycin for 5 more days and discharged home. Request PCP to check CBC-BMP and a 2 view chest x-ray within a week.   2. Incidental finding of chronic diastolic dysfunction. Mildly reduced ejection fraction of 45-50%. Some hypokinesis on echogram. He is completely symptom free, will be placed on aspirin, counseled to quit smoking. Requested to follow with cardiology within a week. Blood pressures too low for him to be placed on beta blocker or ACE inhibitor. No chest pain or cardiac symptoms this admission.    3. Mild COPD exacerbation. Treated with steroids, currently on 40 mg oral prednisone, placed on Medrol Dosepak. Has nebulizer treatments at home. Counseled to quit smoking.   4. BPH. On Flomax.   5. Anemia of chronic disease. Outpatient follow-up with PCP. Occult blood in stool negative here. Anemia panel stable here.   6. Smoking. Counseled to quit.    Discharge Condition: Stable   Follow UP  Follow-up Information    Follow up with [name],[name], MD. Schedule an appointment as soon as possible for a visit in 1 week.   Specialty:  Family Medicine   Contact information:   [place] [address] [phone]       Follow up with [name]. Schedule an appointment as soon as possible for a visit in 1 week.   Why:  CHF   Contact information:   [address] [place] [id number]         Discharge Instructions  and  Discharge Medications      Discharge Instructions    Discharge instructions    Complete by:  As directed   Follow with Primary MD [name],[name], MD in 7 days   Get CBC, CMP, 2 view Chest X ray checked  by Primary MD next visit.    Activity: As tolerated with Full fall precautions use walker/cane & assistance as needed   Disposition Home    Diet: Heart  Healthy Check your Weight same time everyday, if you gain over 2 pounds, or you develop in leg swelling, experience more shortness of breath or chest pain, call your Primary MD immediately. Follow Cardiac Low Salt Diet and 1.5 lit/day fluid restriction.   On your next visit with your primary care physician please Get Medicines reviewed and adjusted.   Please request your Prim.MD to go over all Hospital Tests and Procedure/Radiological results at the follow up, please get  all Hospital records sent to your Prim MD by signing hospital release before you go home.   If you experience worsening of your admission symptoms, develop shortness of breath, life threatening emergency, suicidal or homicidal thoughts you must seek medical attention immediately by calling 911 or calling your MD immediately  if symptoms less severe.  You Must read complete instructions/literature along with all the possible adverse reactions/side effects for all the Medicines you take and that have been prescribed to you. Take any new Medicines after you have completely understood and accpet all the possible adverse reactions/side effects.   Do not drive, operating heavy machinery, perform activities at heights, swimming or participation in water activities or provide baby sitting services if your were admitted for syncope or siezures until you have seen by Primary MD or a Neurologist and advised to do so again.  Do not drive when taking Pain medications.    Do not take more than prescribed Pain, Sleep and Anxiety Medications  Special Instructions: If you have smoked or chewed Tobacco  in the last 2 yrs please stop smoking, stop any regular Alcohol  and or any Recreational drug use.  Wear Seat belts while driving.   Please note  You were cared for by a hospitalist during your hospital stay. If you have any questions about your discharge medications or the care you received while you were in the hospital after you are  discharged, you can call the unit and asked to speak with the hospitalist on call if the hospitalist that took care of you is not available. Once you are discharged, your primary care physician will handle any further medical issues. Please note that NO REFILLS for any discharge medications will be authorized once you are discharged, as it is imperative that you return to your primary care physician (or establish a relationship with a primary care physician if you do not have one) for your aftercare needs so that they can reassess your need for medications and monitor your lab values.     Increase activity slowly    Complete by:  As directed             Medication List    STOP taking these medications        predniSONE 20 MG tablet  Commonly known as:  DELTASONE     UNABLE TO FIND      TAKE these medications        albuterol 108 (90 BASE) MCG/ACT inhaler  Commonly known as:  PROVENTIL HFA;VENTOLIN HFA  Inhale 2 puffs into the lungs every 4 (four) hours as needed for wheezing or shortness of breath (cough, shortness of breath or wheezing.).     albuterol (2.5 MG/3ML) 0.083% nebulizer solution  Commonly known as:  PROVENTIL  Take 3 mLs (2.5 mg total) by nebulization every 6 (six) hours as needed for wheezing or shortness of breath.     aspirin EC 81 MG tablet  Take 1 tablet (81 mg total) by mouth daily.     azithromycin 250 MG tablet  Commonly known as:  ZITHROMAX  Take 1 tablet (250 mg total) by mouth as directed.     E-Z SPACER inhaler  Use as instructed     feeding supplement (ENSURE ENLIVE) Liqd  Take 237 mLs by mouth daily after supper.     fluticasone 50 MCG/ACT nasal spray  Commonly known as:  FLONASE  Place 2 sprays into both nostrils daily. 2 sprays in each nostril daily as  needed     Fluticasone-Salmeterol 250-50 MCG/DOSE Aepb  Commonly known as:  ADVAIR  Inhale 1 puff into the lungs 2 (two) times daily.     ketoconazole 2 % cream  Commonly known as:  NIZORAL   Apply 1 application topically 2 (two) times daily.     methylPREDNISolone 4 MG Tbpk tablet  Commonly known as:  MEDROL DOSEPAK  follow package directions     multivitamin tablet  Take 1 tablet by mouth daily.     tamsulosin 0.4 MG Caps capsule  Commonly known as:  FLOMAX  TAKE ONE CAPSULE BY MOUTH EVERY DAY     tiotropium 18 MCG inhalation capsule  Commonly known as:  SPIRIVA HANDIHALER  Place 1 capsule (18 mcg total) into inhaler and inhale daily.          Diet and Activity recommendation: See Discharge Instructions above   Consults obtained - none   Major procedures and Radiology Reports - PLEASE review detailed and final reports for all details, in brief -   TTE   - Left ventricle: The cavity size was normal. Wall thickness was normal. Systolic function was mildly reduced. The estimated ejection fraction was in the range of 45% to 50%. Probable hypokinesis of the inferior myocardium. Doppler parameters are consistent with abnormal left ventricular relaxation (grade 1 diastolic dysfunction). - Aortic valve: There was trivial regurgitation. - Tricuspid valve: There was moderate regurgitation. - Pulmonary arteries: Systolic pressure was mildly increased. PA peak pressure: 39 mm Hg (S). - Pericardium, extracardiac: A trivial pericardial effusion was identified.  Dg Chest 2 View (if Patient Has Fever And/or Copd)  07/25/2014   CLINICAL DATA:  Patient with left-sided chest pain, shortness of breath and weakness.  EXAM: CHEST  2 VIEW  COMPARISON:  Chest radiograph 06/21/2014  FINDINGS: Re- demonstrated severe emphysematous change. Stable cardiac and mediastinal contours. Interval development of large area of pulmonary consolidation involving the left mid and lower lung. Persistent right upper lobe scarring. No definite pleural effusion or pneumothorax.  IMPRESSION: Interval development of pulmonary consolidation within the left mid and lower lung, concerning  for pneumonia. Recommend short-term followup radiograph in 3-4 weeks to assess for interval resolution.   Electronically Signed   By: Lovey Newcomer M.D.   On: 07/25/2014 18:13   Ct Angio Chest Pe W/cm &/or Wo Cm  07/27/2014   CLINICAL DATA:  Shortness of breath and chest pain for 5 days  EXAM: CT ANGIOGRAPHY CHEST WITH CONTRAST  TECHNIQUE: Multidetector CT imaging of the chest was performed using the standard protocol during bolus administration of intravenous contrast. Multiplanar CT image reconstructions and MIPs were obtained to evaluate the vascular anatomy.  CONTRAST:  68mL OMNIPAQUE IOHEXOL 350 MG/ML SOLN  COMPARISON:  06/21/14, 07/25/2014  FINDINGS: Diffuse emphysematous changes are identified bilaterally. Scarring is again seen in the upper lobe on the right with some confluent bullous changes stable from previous chest x-rays. Adjacent bronchiectasis is noted as well. These changes are chronic in appearance. Emphysematous changes are noted on the left. A left-sided pleural effusion is noted. Additionally there is significant lingular infiltrate similar to that seen on prior plain film examination. This is acute in nature consistent with pneumonia. No other focal infiltrate is seen.  The thoracic inlet is within normal limits. The thoracic aorta and its branches are unremarkable without aneurysmal dilatation or dissection. The pulmonary artery shows a normal branching pattern without evidence of filling defect to suggest pulmonary embolism. No significant hilar or mediastinal adenopathy is  noted.  Scanning into the upper abdomen reveals a hiatal hernia. No other definitive abnormality is seen. No acute bony abnormality is noted.  Review of the MIP images confirms the above findings.  IMPRESSION: Chronic changes in the right lung apex.  No evidence of pulmonary emboli.  Lingular pneumonia with associated left-sided pleural effusion.   Electronically Signed   By: Inez Catalina M.D.   On: 07/27/2014 14:43     Micro Results      Recent Results (from the past 240 hour(s))  Culture, sputum-assessment     Status: None   Collection Time: 07/26/14  2:34 AM  Result Value Ref Range Status   Specimen Description SPUTUM  Final   Special Requests NONE  Final   Sputum evaluation   Final    THIS SPECIMEN IS ACCEPTABLE. RESPIRATORY CULTURE REPORT TO FOLLOW.   Report Status 07/26/2014 FINAL  Final  Culture, respiratory (NON-Expectorated)     Status: None   Collection Time: 07/26/14  2:34 AM  Result Value Ref Range Status   Specimen Description SPUTUM  Final   Special Requests NONE  Final   Gram Stain   Final    MODERATE WBC PRESENT,BOTH PMN AND MONONUCLEAR FEW SQUAMOUS EPITHELIAL CELLS PRESENT MODERATE GRAM POSITIVE COCCI IN CLUSTERS IN PAIRS IN CHAINS Performed at [facility]    Culture   Final    NORMAL OROPHARYNGEAL FLORA Performed at [facility]    Report Status 07/28/2014 FINAL  Final       Today   Subjective:   Prakash Kimberling today has no headache,no chest abdominal pain,no new weakness tingling or numbness, feels much better wants to go home today.    Objective:   Blood pressure 103/70, pulse 74, temperature 97.7 F (36.5 C), temperature source Oral, resp. rate 18, height 5\' 6"  (1.676 m), weight 44.453 kg (98 lb), SpO2 100 %.   Intake/Output Summary (Last 24 hours) at 07/29/14 1017 Last data filed at 07/29/14 6195  Gross per 24 hour  Intake   1379 ml  Output   5076 ml  Net  -3697 ml    Exam Awake Alert, Oriented x 3, No new F.N deficits, Normal affect [place].AT,PERRAL Supple Neck,No JVD, No cervical lymphadenopathy appriciated.  Symmetrical Chest wall movement, Good air movement bilaterally, CTAB RRR,No Gallops,Rubs or new Murmurs, No Parasternal Heave +ve B.Sounds, Abd Soft, Non tender, No organomegaly appriciated, No rebound -guarding or rigidity. No Cyanosis, Clubbing or edema, No new Rash or bruise  Data Review   CBC w Diff: Lab Results   Component Value Date   WBC 11.3* 07/28/2014   WBC 6.6 06/21/2014   HGB 10.7* 07/28/2014   HGB 13.6* 06/21/2014   HCT 31.4* 07/28/2014   HCT 41.9* 06/21/2014   PLT 459* 07/28/2014   LYMPHOPCT 21 09/08/2007   MONOPCT 7 09/08/2007   EOSPCT 3 09/08/2007   BASOPCT 0 09/08/2007    CMP: Lab Results  Component Value Date   NA 134* 07/28/2014   K 3.9 07/28/2014   CL 98 07/28/2014   CO2 28 07/28/2014   BUN 6 07/28/2014   CREATININE 0.52 07/28/2014   CREATININE 0.77 06/21/2014   PROT 7.1 06/21/2014   ALBUMIN 3.9 06/21/2014   BILITOT 0.5 06/21/2014   ALKPHOS 75 06/21/2014   AST 19 06/21/2014   ALT 20 06/21/2014  .   Total Time in preparing paper work, data evaluation and todays exam - 35 minutes  Lala Lund K M.D on 07/29/2014 at 10:17 AM  [facility]   Office  [phone]

## 2014-07-29 NOTE — Care Management Note (Signed)
    Page 1 of 1   07/29/2014     4:55:52 PM CARE MANAGEMENT NOTE 07/29/2014  Patient:  Warren [name], Warren [name]   Account Number:  [id number]  Date Initiated:  07/29/2014  Documentation initiated by:  Marvetta Gibbons  Subjective/Objective Assessment:   Pt admitted with PNA     Action/Plan:   PTA pt lived at home with spouse   Anticipated DC Date:  07/29/2014   Anticipated DC Plan:  HOME/SELF CARE         Choice offered to / List presented to:             Status of service:  Completed, signed off Medicare Important Message given?  NO (If response is "NO", the following Medicare IM given date fields will be blank) Date Medicare IM given:   Medicare IM given by:   Date Additional Medicare IM given:   Additional Medicare IM given by:    Discharge Disposition:  HOME/SELF CARE  Per UR Regulation:  Reviewed for med. necessity/level of care/duration of stay  If discussed at [place] of Stay Meetings, dates discussed:    Comments:

## 2014-07-29 NOTE — Discharge Instructions (Signed)
Follow with Primary MD [name],[name], MD in 7 days   Get CBC, CMP, 2 view Chest X ray checked  by Primary MD next visit.    Activity: As tolerated with Full fall precautions use walker/cane & assistance as needed   Disposition Home    Diet: Heart Healthy Check your Weight same time everyday, if you gain over 2 pounds, or you develop in leg swelling, experience more shortness of breath or chest pain, call your Primary MD immediately. Follow Cardiac Low Salt Diet and 1.5 lit/day fluid restriction.   On your next visit with your primary care physician please Get Medicines reviewed and adjusted.   Please request your Prim.MD to go over all Hospital Tests and Procedure/Radiological results at the follow up, please get all Hospital records sent to your Prim MD by signing hospital release before you go home.   If you experience worsening of your admission symptoms, develop shortness of breath, life threatening emergency, suicidal or homicidal thoughts you must seek medical attention immediately by calling 911 or calling your MD immediately  if symptoms less severe.  You Must read complete instructions/literature along with all the possible adverse reactions/side effects for all the Medicines you take and that have been prescribed to you. Take any new Medicines after you have completely understood and accpet all the possible adverse reactions/side effects.   Do not drive, operating heavy machinery, perform activities at heights, swimming or participation in water activities or provide baby sitting services if your were admitted for syncope or siezures until you have seen by Primary MD or a Neurologist and advised to do so again.  Do not drive when taking Pain medications.    Do not take more than prescribed Pain, Sleep and Anxiety Medications  Special Instructions: If you have smoked or chewed Tobacco  in the last 2 yrs please stop smoking, stop any regular Alcohol  and or any Recreational drug  use.  Wear Seat belts while driving.   Please note  You were cared for by a hospitalist during your hospital stay. If you have any questions about your discharge medications or the care you received while you were in the hospital after you are discharged, you can call the unit and asked to speak with the hospitalist on call if the hospitalist that took care of you is not available. Once you are discharged, your primary care physician will handle any further medical issues. Please note that NO REFILLS for any discharge medications will be authorized once you are discharged, as it is imperative that you return to your primary care physician (or establish a relationship with a primary care physician if you do not have one) for your aftercare needs so that they can reassess your need for medications and monitor your lab values.  Low-Sodium Eating Plan Sodium raises blood pressure and causes water to be held in the body. Getting less sodium from food will help lower your blood pressure, reduce any swelling, and protect your heart, liver, and kidneys. We get sodium by adding salt (sodium chloride) to food. Most of our sodium comes from canned, boxed, and frozen foods. Restaurant foods, fast foods, and pizza are also very high in sodium. Even if you take medicine to lower your blood pressure or to reduce fluid in your body, getting less sodium from your food is important. WHAT IS MY PLAN? Most people should limit their sodium intake to 2,300 mg a day. Your health care provider recommends that you limit your sodium intake  to __________ a day.  WHAT DO I NEED TO KNOW ABOUT THIS EATING PLAN? For the low-sodium eating plan, you will follow these general guidelines:  Choose foods with a % Daily Value for sodium of less than 5% (as listed on the food label).   Use salt-free seasonings or herbs instead of table salt or sea salt.   Check with your health care provider or pharmacist before using salt  substitutes.   Eat fresh foods.  Eat more vegetables and fruits.  Limit canned vegetables. If you do use them, rinse them well to decrease the sodium.   Limit cheese to 1 oz (28 g) per day.   Eat lower-sodium products, often labeled as "lower sodium" or "no salt added."  Avoid foods that contain monosodium glutamate (MSG). MSG is sometimes added to [place] food and some canned foods.  Check food labels (Nutrition Facts labels) on foods to learn how much sodium is in one serving.  Eat more home-cooked food and less restaurant, buffet, and fast food.  When eating at a restaurant, ask that your food be prepared with less salt or none, if possible.  HOW DO I READ FOOD LABELS FOR SODIUM INFORMATION? The Nutrition Facts label lists the amount of sodium in one serving of the food. If you eat more than one serving, you must multiply the listed amount of sodium by the number of servings. Food labels may also identify foods as:  Sodium free--Less than 5 mg in a serving.  Very low sodium--35 mg or less in a serving.  Low sodium--140 mg or less in a serving.  Light in sodium--50% less sodium in a serving. For example, if a food that usually has 300 mg of sodium is changed to become light in sodium, it will have 150 mg of sodium.  Reduced sodium--25% less sodium in a serving. For example, if a food that usually has 400 mg of sodium is changed to reduced sodium, it will have 300 mg of sodium. WHAT FOODS CAN I EAT? Grains Low-sodium cereals, including oats, puffed wheat and rice, and shredded wheat cereals. Low-sodium crackers. Unsalted rice and pasta. Lower-sodium bread.  Vegetables Frozen or fresh vegetables. Low-sodium or reduced-sodium canned vegetables. Low-sodium or reduced-sodium tomato sauce and paste. Low-sodium or reduced-sodium tomato and vegetable juices.  Fruits Fresh, frozen, and canned fruit. Fruit juice.  Meat and Other Protein Products Low-sodium canned tuna and  salmon. Fresh or frozen meat, poultry, seafood, and fish. Lamb. Unsalted nuts. Dried beans, peas, and lentils without added salt. Unsalted canned beans. Homemade soups without salt. Eggs.  Dairy Milk. Soy milk. Ricotta cheese. Low-sodium or reduced-sodium cheeses. Yogurt.  Condiments Fresh and dried herbs and spices. Salt-free seasonings. Onion and garlic powders. Low-sodium varieties of mustard and ketchup. Lemon juice.  Fats and Oils Reduced-sodium salad dressings. Unsalted butter.  Other Unsalted popcorn and pretzels.  The items listed above may not be a complete list of recommended foods or beverages. Contact your dietitian for more options. WHAT FOODS ARE NOT RECOMMENDED? Grains Instant hot cereals. Bread stuffing, pancake, and biscuit mixes. Croutons. Seasoned rice or pasta mixes. Noodle soup cups. Boxed or frozen macaroni and cheese. Self-rising flour. Regular salted crackers. Vegetables Regular canned vegetables. Regular canned tomato sauce and paste. Regular tomato and vegetable juices. Frozen vegetables in sauces. Salted french fries. Olives. Angie Fava. Relishes. Sauerkraut. Salsa. Meat and Other Protein Products Salted, canned, smoked, spiced, or pickled meats, seafood, or fish. Bacon, ham, sausage, hot dogs, corned beef, chipped beef, and packaged  luncheon meats. Salt pork. Jerky. Pickled herring. Anchovies, regular canned tuna, and sardines. Salted nuts. Dairy Processed cheese and cheese spreads. Cheese curds. Blue cheese and cottage cheese. Buttermilk.  Condiments Onion and garlic salt, seasoned salt, table salt, and sea salt. Canned and packaged gravies. Worcestershire sauce. Tartar sauce. Barbecue sauce. Teriyaki sauce. Soy sauce, including reduced sodium. Steak sauce. Fish sauce. Oyster sauce. Cocktail sauce. Horseradish. Regular ketchup and mustard. Meat flavorings and tenderizers. Bouillon cubes. Hot sauce. Tabasco sauce. Marinades. Taco seasonings. Relishes. Fats and  Oils Regular salad dressings. Salted butter. Margarine. Ghee. Bacon fat.  Other Potato and tortilla chips. Corn chips and puffs. Salted popcorn and pretzels. Canned or dried soups. Pizza. Frozen entrees and pot pies.  The items listed above may not be a complete list of foods and beverages to avoid. Contact your dietitian for more information. Document Released: 09/16/2001 Document Revised: 04/01/2013 Document Reviewed: 01/29/2013 [facility] Patient Information 2015 [place], [place]. This information is not intended to replace advice given to you by your health care provider. Make sure you discuss any questions you have with your health care provider.

## 2014-08-05 ENCOUNTER — Ambulatory Visit (INDEPENDENT_AMBULATORY_CARE_PROVIDER_SITE_OTHER): Payer: PRIVATE HEALTH INSURANCE | Admitting: Neurology

## 2014-08-05 ENCOUNTER — Encounter: Payer: Self-pay | Admitting: Neurology

## 2014-08-05 VITALS — BP 106/66 | HR 82 | Ht 66.0 in | Wt 97.6 lb

## 2014-08-05 DIAGNOSIS — R471 Dysarthria and anarthria: Secondary | ICD-10-CM | POA: Diagnosis not present

## 2014-08-05 DIAGNOSIS — R1314 Dysphagia, pharyngoesophageal phase: Secondary | ICD-10-CM | POA: Diagnosis not present

## 2014-08-05 DIAGNOSIS — R269 Unspecified abnormalities of gait and mobility: Secondary | ICD-10-CM

## 2014-08-05 DIAGNOSIS — G118 Other hereditary ataxias: Secondary | ICD-10-CM

## 2014-08-05 NOTE — Progress Notes (Signed)
Reason for visit: Spinocerebellar ataxia  Warren [name] is an 59 y.o. male  History of present illness:  Mr. Warren [name] is a 59 year old right-handed white male with a history of spinocerebellar ataxia type VI. The patient has recently been in the hospital with a pneumonia. Chest x-rays were done, no evidence of cancer was noted. The patient has had increasing problems with shortness of breath, and difficulty with weight loss. The patient has lost another 4 pounds since last seen. He has a poor appetite. His dysarthria is getting worse. He indicates the does not have problems with swallowing or choking, but he continues to cough at this point, not completely recovered from the recent pneumonia. The patient was discharged from the hospital last week. He has not worked in 2 weeks because the pneumonia. The patient works as a Administrator, and his job requires a lot of heavy lifting. He indicates that he is having worsening problems with balance, he uses a quad cane for ambulation. He has had stumbles, no falls since last seen. Performing his job tasks is becoming more and more difficult. The patient is considering disability at this point. He has had a lot of dizziness since the pneumonia, he has not driven a car since he has gotten pneumonia.  Past Medical History  Diagnosis Date  . Allergic rhinitis   . COPD (chronic obstructive pulmonary disease)   . Testicular atrophy     Right  . Candidal esophagitis     Dr. Benson [place] 10/2012  . Osteoporosis 04/10/2009    L hip fracture; T score -4.2 in 2012.  Fosamax for one year.  . Dysarthria 02/16/2014  . 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 Abnormal MRI of head   . 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 Dysphagia, pharyngoesophageal phase 06/15/2014  . Raynaud disease 06/15/2014    Bilateral hands  . Pneumonia     Past Surgical History  Procedure Laterality Date  . Nasal fracture surgery    . Vasectomy    . Inguinal hernia repair      bilateral  . Colonoscopy w/  polypectomy  12/07/2006    three polyps sigmoid.  Warren [name]. Repeat 3 years.  . Egd  09/24/2012    hiatal hernia; candidal esophagitis, hematin in stomach; no active source of bleeding.  . Colonoscopy w/ polypectomy  09/24/2012    five sessile polyps removed.  Internal and external hemorrhoids.  . Cataract extraction      BOTH EYES  . Eye surgery  04/10/2014    B cataract surgery    Family History  Problem Relation Age of Onset  . Heart disease Father     valve replacement; CHF; heart transplant candidate  . Hypertension Mother   . Hyperlipidemia Mother   . Diabetes Mother   . Stroke Mother 82    cause of death.  . Diabetes Sister   . Hyperlipidemia Sister   . Stroke Brother     Social history:  reports that he quit smoking 11 days ago. He has never used smokeless tobacco. He reports that he does not drink alcohol or use illicit drugs.    Allergies  Allergen Reactions  . Contrast Media [Iodinated Diagnostic Agents] Hives and Itching    Happened 40 years ago  . Iodine Hives    Medications:  Prior to Admission medications   Medication Sig Start Date End Date Taking? Authorizing Provider  albuterol (PROVENTIL HFA;VENTOLIN HFA) 108 (90 BASE) MCG/ACT inhaler Inhale 2 puffs into the lungs  every 4 (four) hours as needed for wheezing or shortness of breath (cough, shortness of breath or wheezing.). 03/22/14  Yes Tereasa Coop, PA-C  albuterol (PROVENTIL) (2.5 MG/3ML) 0.083% nebulizer solution Take 3 mLs (2.5 mg total) by nebulization every 6 (six) hours as needed for wheezing or shortness of breath. 06/21/14  Yes Wardell Honour, MD  aspirin EC 81 MG tablet Take 1 tablet (81 mg total) by mouth daily. 07/29/14  Yes Thurnell Lose, MD  feeding supplement, ENSURE ENLIVE, (ENSURE ENLIVE) LIQD Take 237 mLs by mouth daily after supper. 07/29/14  Yes Thurnell Lose, MD  fluticasone (FLONASE) 50 MCG/ACT nasal spray Place 2 sprays into both nostrils daily. 2 sprays in each nostril daily as  needed 09/28/13  Yes Wardell Honour, MD  Fluticasone-Salmeterol (ADVAIR) 250-50 MCG/DOSE AEPB Inhale 1 puff into the lungs 2 (two) times daily. 01/27/14  Yes Wardell Honour, MD  Multiple Vitamin (MULTIVITAMIN) tablet Take 1 tablet by mouth daily.     Yes Historical Provider, MD  Spacer/Aero-Holding Chambers (E-Z SPACER) inhaler Use as instructed 03/22/14  Yes Tereasa Coop, PA-C  tamsulosin (FLOMAX) 0.4 MG CAPS capsule TAKE ONE CAPSULE BY MOUTH EVERY DAY 01/17/14  Yes Mancel Bale, PA-C  tiotropium (SPIRIVA HANDIHALER) 18 MCG inhalation capsule Place 1 capsule (18 mcg total) into inhaler and inhale daily. 09/28/13  Yes Wardell Honour, MD    ROS:  Out of a complete 14 system review of symptoms, the patient complains only of the following symptoms, and all other reviewed systems are negative.  Fatigue Cough, wheezing Achy muscles, walking difficulty Dizziness Speech difficulty, weakness  Blood pressure 106/66, pulse 82, height 5\' 6"  (1.676 m), weight 97 lb 9.6 oz (44.271 kg).   Physical Exam  General: The patient is alert and cooperative at the time of the examination. The patient is thin.  Skin: No significant peripheral edema is noted.   Neurologic Exam  Mental status: The patient is oriented x 3.  Cranial nerves: Facial symmetry is present. Speech is dysarthric, not aphasic. Extraocular movements are full. End-gaze nystagmus is seen bilaterally. Visual fields are full.  Motor: The patient has good strength in all 4 extremities.  Sensory examination: Soft touch sensation is symmetric on the face, arms, and legs.  Coordination: The patient has mild ataxia with finger-nose-finger and heel-to-shin bilaterally..  Gait and station: The patient has a slightly wide-based, ataxic gait. The patient is able to perform tandem gait with minimal instability. Romberg is negative. No drift is seen.  Reflexes: Deep tendon reflexes are symmetric. Knee jerk reflexes are slightly brisk  bilaterally.     Assessment/Plan:  1. Spinocerebellar ataxia type VI  2. Recurrent pneumonia  3. Gait disorder  4. Poor nutritional state, weight loss  The patient continues to lose weight. He has had progressive problems with his gait instability, and dysarthria. Given the recurrent episodes of pneumonia, I will send the patient for a modified barium swallow to exclude the possibility of silent aspiration as an etiology for the pneumonia. The patient is unable to maintain gainful employment this time given his progressive neurologic deficits. His job requires significant physical effort, which is becoming impossible given his weight loss and progressive gait instability. The patient will be applying for social security disability, which seems reasonable at this point. I will contact the patient when the modified barium swallow results are available.  Jill Alexanders MD 08/05/2014 7:24 PM  [facility] [address]  [place], [place] [address]  Phone [phone] Fax [phone]

## 2014-08-05 NOTE — Patient Instructions (Signed)

## 2014-08-06 ENCOUNTER — Ambulatory Visit (INDEPENDENT_AMBULATORY_CARE_PROVIDER_SITE_OTHER): Payer: PRIVATE HEALTH INSURANCE | Admitting: Family Medicine

## 2014-08-06 ENCOUNTER — Ambulatory Visit (INDEPENDENT_AMBULATORY_CARE_PROVIDER_SITE_OTHER): Payer: PRIVATE HEALTH INSURANCE

## 2014-08-06 VITALS — BP 100/60 | HR 79 | Temp 98.0°F | Resp 16 | Ht 67.0 in | Wt 97.8 lb

## 2014-08-06 DIAGNOSIS — Z720 Tobacco use: Secondary | ICD-10-CM | POA: Diagnosis not present

## 2014-08-06 DIAGNOSIS — E871 Hypo-osmolality and hyponatremia: Secondary | ICD-10-CM

## 2014-08-06 DIAGNOSIS — R1314 Dysphagia, pharyngoesophageal phase: Secondary | ICD-10-CM

## 2014-08-06 DIAGNOSIS — D631 Anemia in chronic kidney disease: Secondary | ICD-10-CM

## 2014-08-06 DIAGNOSIS — J189 Pneumonia, unspecified organism: Secondary | ICD-10-CM

## 2014-08-06 DIAGNOSIS — J449 Chronic obstructive pulmonary disease, unspecified: Secondary | ICD-10-CM

## 2014-08-06 DIAGNOSIS — G118 Other hereditary ataxias: Secondary | ICD-10-CM

## 2014-08-06 DIAGNOSIS — N189 Chronic kidney disease, unspecified: Secondary | ICD-10-CM | POA: Diagnosis not present

## 2014-08-06 DIAGNOSIS — R634 Abnormal weight loss: Secondary | ICD-10-CM | POA: Diagnosis not present

## 2014-08-06 LAB — POCT CBC
Granulocyte percent: 57.4 %G (ref 37–80)
HEMATOCRIT: 35.5 % — AB (ref 43.5–53.7)
Hemoglobin: 11.9 g/dL — AB (ref 14.1–18.1)
Lymph, poc: 2.4 (ref 0.6–3.4)
MCH, POC: 32.2 pg — AB (ref 27–31.2)
MCHC: 33.5 g/dL (ref 31.8–35.4)
MCV: 96.2 fL (ref 80–97)
MID (CBC): 0.7 (ref 0–0.9)
MPV: 5.5 fL (ref 0–99.8)
PLATELET COUNT, POC: 659 10*3/uL — AB (ref 142–424)
POC Granulocyte: 4.2 (ref 2–6.9)
POC LYMPH %: 33 % (ref 10–50)
POC MID %: 9.6 %M (ref 0–12)
RBC: 3.68 M/uL — AB (ref 4.69–6.13)
RDW, POC: 15.1 %
WBC: 7.3 10*3/uL (ref 4.6–10.2)

## 2014-08-06 MED ORDER — ALBUTEROL SULFATE (2.5 MG/3ML) 0.083% IN NEBU: 2.5000 mg | INHALATION_SOLUTION | Freq: Four times a day (QID) | RESPIRATORY_TRACT | Status: DC | PRN

## 2014-08-06 MED ORDER — VARENICLINE TARTRATE 1 MG PO TABS: 1.0000 mg | ORAL_TABLET | Freq: Two times a day (BID) | ORAL | Status: DC

## 2014-08-06 NOTE — Patient Instructions (Signed)
1.  Drink Ensure one at bedtime.   2. Return in two weeks for reevaluation. 3.  Increase activity level each day.

## 2014-08-06 NOTE — Progress Notes (Addendum)
Patient ID: Warren [name], male   DOB: [birth year], 59 y.o.   MRN: [id number]   Subjective:  This chart was scribed for Warren Forts, MD by [facility], medical scribe at [facility].The patient was seen in exam room 09 and the patient's care was started at 6:16 PM.   Patient ID: Warren [name], male    DOB: 11/28/1955, 59 y.o.   MRN: [id number]  08/06/2014  Hospitalization Follow-up  HPI HPI Comments: RAMIN ZOLL is a 59 y.o. male with a history of COPD, spinocerebellar ataxia type IV who presents to [facility] for a hospitalization follow up for community acquired pneumonia.   Pt was seen by neurology yesterday. Dr. Jannifer Franklin referred for a modified barium swallow to rule out aspiration.   Pt was admitted to the hospital 07/25/14- 07/29/2014 for community acquired pneumonia and COPD exacerbation. CXR showed LEFT middle, lower lobe strep pneumonia. HIV was negative, influenza panel was negative. Urinary antigen positive for strep. Discharge with azithromycin and  recommended CMP, BMP and chest xray at follow up visit with PCP. Upon admission cardiac evaluation was performed due to chest pain; cardiac enzymes negative x 3. ECHO showed EF 45-50% with hypokinesis identified; diastolic dysfunction also identified. Advised  to follow up with cardiology in one week. Chest CT on 4/18 negative for pulmonary embolism but +for lingular pneumonia LEFT.  Breathing has improved but the cough has persisted. He still has some chest congestion. Pt is lightheaded and unsteady on feet. He says his lightheadedness began when he left the hospital and has been constant since. Wife does not think he can return to work, She states he has not been using his cane to assist him. Wife is concerned and does want to leave him home alone due to possibility of falls. Pt has difficulty sleeping through the night.  He wakes about every 2 hours to use the bathroom. Pt has been using his nebulizer four  times a day. Appetite has improved; he is eating three meals a day but some days he does not eat everything on his plate. Weighing around 97 pounds; in September he weighed 104. Pt quit smoking, since leaving the hospital. He would like chantix to help him quit smoking. He denies trouble swallowing, fever, chills, congestion and rhinorrhea, sore throat, indigestion, chest pain.  +Yeast infection in his esophageus one year ago, he had an endoscopy in 2014, performed by Dr. Benson Norway.   Pt is applying for disability, appointment on May 16 th 2016. He has not seen a pulmonologist for some time.  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  Still very tired and fatigued.  SOB has improved.  No leg swelling.    Review of Systems  Constitutional: Negative for fever, chills, diaphoresis, activity change, appetite change and fatigue.  HENT: Positive for congestion. Negative for rhinorrhea, sore throat and trouble swallowing.   Respiratory: Positive for cough and shortness of breath. Negative for wheezing and stridor.   Cardiovascular: Negative for chest pain, palpitations and leg swelling.  Gastrointestinal: Negative for nausea, vomiting, abdominal pain and diarrhea.  Endocrine: Negative for cold intolerance, heat intolerance, polydipsia, polyphagia and polyuria.  Genitourinary: Positive for testicular pain.  Musculoskeletal: Positive for gait problem.  Skin: Negative for color change, rash and wound.  Neurological: Positive for light-headedness. Negative for dizziness, tremors, seizures, syncope, facial asymmetry, speech difficulty, weakness,  numbness and headaches.  Psychiatric/Behavioral: Negative for sleep disturbance and dysphoric mood. The patient is not nervous/anxious.    Past Medical History  Diagnosis Date  . Allergic rhinitis   . COPD (chronic obstructive pulmonary disease)   . Testicular atrophy     Right  .  Candidal esophagitis     Dr. Benson Norway 10/2012  . Osteoporosis 04/10/2009    L hip fracture; T score -4.2 in 2012.  Fosamax for one year.  . Dysarthria 02/16/2014  . 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 Abnormal MRI of head   . 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 Dysphagia, pharyngoesophageal phase 06/15/2014  . Raynaud disease 06/15/2014    Bilateral hands  . Pneumonia    Past Surgical History  Procedure Laterality Date  . Nasal fracture surgery    . Vasectomy    . Inguinal hernia repair      bilateral  . Colonoscopy w/ polypectomy  12/07/2006    three polyps sigmoid.  [name]. Repeat 3 years.  . Egd  09/24/2012    hiatal hernia; candidal esophagitis, hematin in stomach; no active source of bleeding.  . Colonoscopy w/ polypectomy  09/24/2012    five sessile polyps removed.  Internal and external hemorrhoids.  . Cataract extraction      BOTH EYES  . Eye surgery  04/10/2014    B cataract surgery   Allergies  Allergen Reactions  . Contrast Media [Iodinated Diagnostic Agents] Hives and Itching    Happened 40 years ago  . Iodine Hives   History   Social History  . Marital Status: Married    Spouse Name: N/A  . Number of Children: 1  . Years of Education: BS   Occupational History  . 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 Smoking status: Former Smoker -- 1.50 packs/day for 30 years    Quit date: 07/25/2014  . Smokeless tobacco: Never Used     Comment: pt says he is smoking 5 or 6 cigarettes daily  . Alcohol Use: No     Comment: 2 beers monthly  . Drug Use: No  . Sexual Activity: Not on file   Other Topics Concern  . Not on file   Social History Narrative   Marital status: married x 31 years.      Children:  One child (25); no grandchildren.      Lives: with wife, son.  Has a barn; cats.      Employment: unemployed in 2015; previous truck driver x 30 years; hauls beer.  Drives locally     Tobacco: 1 ppd x 30 years      Alcohol:  Rare/none        Drugs: none      Exercise: sporadic      Seatbelt:  100%      Guns:  Unloaded.   Patient is right handed.   Patient drinks 3-4 cups of caffeine daily.   Family History  Problem Relation Age of Onset  . Heart disease Father     valve replacement; CHF; heart transplant candidate  . Hypertension Mother   . Hyperlipidemia Mother   . Diabetes Mother   . Stroke Mother 53    cause of death.  . Diabetes Sister   . Hyperlipidemia Sister   . Stroke Brother         Objective:    BP 100/60 mmHg  Pulse 79  Temp(Src) 98 F (36.7  C) (Oral)  Resp 16  Ht 5\' 7"  (1.702 m)  Wt 97 lb 12.8 oz (44.362 kg)  BMI 15.31 kg/m2  SpO2 98% Physical Exam  Constitutional: He is oriented to person, place, and time. He appears well-developed. He appears cachectic. He does not appear ill. No distress.  HENT:  Head: Normocephalic and atraumatic.  Right Ear: External ear normal.  Left Ear: External ear normal.  Nose: Nose normal.  Mouth/Throat: Oropharynx is clear and moist.  Eyes: Conjunctivae and EOM are normal. Pupils are equal, round, and reactive to light.  Neck: Normal range of motion. Neck supple. Carotid bruit is not present. No thyromegaly present.  Cardiovascular: Normal rate, regular rhythm, normal heart sounds and intact distal pulses.  Exam reveals no gallop and no friction rub.   No murmur heard. Pulmonary/Chest: Effort normal and breath sounds normal. He has no wheezes. He has no rales.  Abdominal: Soft. Bowel sounds are normal. He exhibits no distension and no mass. There is no tenderness. There is no rebound and no guarding.  Lymphadenopathy:    He has no cervical adenopathy.  Neurological: He is alert and oriented to person, place, and time. No cranial nerve deficit. Coordination abnormal.  Skin: Skin is warm and dry. No rash noted. He is not diaphoretic.  Psychiatric: He has a normal mood and affect. His behavior is normal.  Nursing note and vitals reviewed.  Results for  orders placed or performed in visit on 08/06/14  POCT CBC  Result Value Ref Range   WBC 7.3 4.6 - 10.2 K/uL   Lymph, poc 2.4 0.6 - 3.4   POC LYMPH PERCENT 33.0 10 - 50 %L   MID (cbc) 0.7 0 - 0.9   POC MID % 9.6 0 - 12 %M   POC Granulocyte 4.2 2 - 6.9   Granulocyte percent 57.4 37 - 80 %G   RBC 3.68 (A) 4.69 - 6.13 M/uL   Hemoglobin 11.9 (A) 14.1 - 18.1 g/dL   HCT, POC 35.5 (A) 43.5 - 53.7 %   MCV 96.2 80 - 97 fL   MCH, POC 32.2 (A) 27 - 31.2 pg   MCHC 33.5 31.8 - 35.4 g/dL   RDW, POC 15.1 %   Platelet Count, POC 659 (A) 142 - 424 K/uL   MPV 5.5 0 - 99.8 fL     [facility] reading (PRIMARY) by  Dr. Tamala Julian.  CXR: decreasing LML, LLL infiltrates.    Assessment & Plan:   1. CAP (community acquired pneumonia)   2. Chronic obstructive pulmonary disease, unspecified COPD, unspecified chronic bronchitis type   3. Spinocerebellar ataxia type 6   4. Tobacco abuse   5. Hyponatremia   6. Anemia in chronic renal disease   7. Dysphagia, pharyngoesophageal phase   8. Loss of weight     1. Community Acquired Pneumonia: s/p admission for four days; s/p Zithromax.  CXR with improvement; s/p CT chest during admission. 2.  COPD: stable; with recent exacerbation; refill of Albuterol nebulizer provided. Refer back to pulmonology for further evaluation. 3.  Tobacco abuse: congratulations on cessation; rx for Chantix provided. 4.  Spinocerebellar ataxia type 6: worsening with acute illness with worsening speech and unsteadiness.  S/p follow-up with [facility] of neurology; scheduled for modified swallow study. 5.  Hyponatremia: stable; repeat; avoid excessive water intake.   6.  Anemia of chronic disease:  Improved; continue to monitor. 7.  Loss of weight: persistent; samples of high protein Ensure provided.    Meds ordered this  encounter  Medications  . albuterol (PROVENTIL) (2.5 MG/3ML) 0.083% nebulizer solution    Sig: Take 3 mLs (2.5 mg total) by nebulization every 6 (six) hours as needed for  wheezing or shortness of breath.    Dispense:  360 mL    Refill:  12  . varenicline (CHANTIX CONTINUING MONTH PAK) 1 MG tablet    Sig: Take 1 tablet (1 mg total) by mouth 2 (two) times daily.    Dispense:  60 tablet    Refill:  2    No Follow-up on file.  I personally performed the services described in this documentation, which was scribed in my presence. The recorded information has been reviewed and considered.  Cari Burgo Elayne Guerin, M.D. [facility] [address] [place], [place]  [address] [phone] phone [phone] fax

## 2014-08-07 LAB — COMPREHENSIVE METABOLIC PANEL
ALK PHOS: 70 U/L (ref 39–117)
ALT: 15 U/L (ref 0–53)
AST: 12 U/L (ref 0–37)
Albumin: 3.5 g/dL (ref 3.5–5.2)
BUN: 16 mg/dL (ref 6–23)
CALCIUM: 8.2 mg/dL — AB (ref 8.4–10.5)
CO2: 24 mEq/L (ref 19–32)
Chloride: 94 mEq/L — ABNORMAL LOW (ref 96–112)
Creat: 0.69 mg/dL (ref 0.50–1.35)
Glucose, Bld: 91 mg/dL (ref 70–99)
POTASSIUM: 4.3 meq/L (ref 3.5–5.3)
SODIUM: 128 meq/L — AB (ref 135–145)
Total Bilirubin: 0.3 mg/dL (ref 0.2–1.2)
Total Protein: 6.4 g/dL (ref 6.0–8.3)

## 2014-08-12 ENCOUNTER — Other Ambulatory Visit (HOSPITAL_COMMUNITY): Payer: Self-pay | Admitting: Neurology

## 2014-08-12 DIAGNOSIS — R1314 Dysphagia, pharyngoesophageal phase: Secondary | ICD-10-CM

## 2014-08-17 ENCOUNTER — Ambulatory Visit (HOSPITAL_COMMUNITY)
Admission: RE | Admit: 2014-08-17 | Discharge: 2014-08-17 | Disposition: A | Discharge status: Home / Self Care | Payer: No Typology Code available for payment source | Source: Ambulatory Visit | Attending: Neurology | Admitting: Neurology

## 2014-08-17 DIAGNOSIS — R1314 Dysphagia, pharyngoesophageal phase: Secondary | ICD-10-CM | POA: Insufficient documentation

## 2014-08-18 NOTE — Progress Notes (Signed)
   08/17/14 1429  SLP G-Codes **NOT FOR INPATIENT CLASS**  Functional Assessment Tool Used MBS, clinical judgement  Functional Limitations Swallowing  Swallow Current Status (G3875) CJ  Swallow Goal Status (I4332) CJ  Swallow Discharge Status (R5188) CJ  SLP Evaluations  $ SLP Speech Visit 1 Procedure  SLP Evaluations  $Swallowing Treatment 1 Procedure  $MBS Swallow Outpatient 1 Procedure  Luanna Salk, [facility] SLP [phone]

## 2014-08-20 ENCOUNTER — Ambulatory Visit (INDEPENDENT_AMBULATORY_CARE_PROVIDER_SITE_OTHER): Payer: PRIVATE HEALTH INSURANCE | Admitting: Family Medicine

## 2014-08-20 VITALS — BP 100/62 | HR 79 | Temp 97.7°F | Resp 20 | Ht 66.5 in | Wt 101.4 lb

## 2014-08-20 DIAGNOSIS — R634 Abnormal weight loss: Secondary | ICD-10-CM

## 2014-08-20 DIAGNOSIS — E871 Hypo-osmolality and hyponatremia: Secondary | ICD-10-CM | POA: Diagnosis not present

## 2014-08-20 DIAGNOSIS — R29898 Other symptoms and signs involving the musculoskeletal system: Secondary | ICD-10-CM

## 2014-08-20 DIAGNOSIS — D638 Anemia in other chronic diseases classified elsewhere: Secondary | ICD-10-CM | POA: Diagnosis not present

## 2014-08-20 DIAGNOSIS — R1314 Dysphagia, pharyngoesophageal phase: Secondary | ICD-10-CM

## 2014-08-20 DIAGNOSIS — J439 Emphysema, unspecified: Secondary | ICD-10-CM | POA: Diagnosis not present

## 2014-08-20 DIAGNOSIS — R471 Dysarthria and anarthria: Secondary | ICD-10-CM | POA: Diagnosis not present

## 2014-08-20 DIAGNOSIS — G118 Other hereditary ataxias: Secondary | ICD-10-CM | POA: Diagnosis not present

## 2014-08-20 DIAGNOSIS — J189 Pneumonia, unspecified organism: Secondary | ICD-10-CM

## 2014-08-20 DIAGNOSIS — H6123 Impacted cerumen, bilateral: Secondary | ICD-10-CM

## 2014-08-20 LAB — POCT CBC
GRANULOCYTE PERCENT: 68.8 % (ref 37–80)
HEMATOCRIT: 34.1 % — AB (ref 43.5–53.7)
Hemoglobin: 10.8 g/dL — AB (ref 14.1–18.1)
LYMPH, POC: 1.9 (ref 0.6–3.4)
MCH, POC: 31.3 pg — AB (ref 27–31.2)
MCHC: 31.7 g/dL — AB (ref 31.8–35.4)
MCV: 98.7 fL — AB (ref 80–97)
MID (CBC): 0.6 (ref 0–0.9)
MPV: 6 fL (ref 0–99.8)
PLATELET COUNT, POC: 302 10*3/uL (ref 142–424)
POC GRANULOCYTE: 5.4 (ref 2–6.9)
POC LYMPH %: 24 % (ref 10–50)
POC MID %: 7.2 %M (ref 0–12)
RBC: 3.45 M/uL — AB (ref 4.69–6.13)
RDW, POC: 17.1 %
WBC: 7.9 10*3/uL (ref 4.6–10.2)

## 2014-08-20 NOTE — Patient Instructions (Signed)
1. Increase activity level daily.

## 2014-08-20 NOTE — Progress Notes (Addendum)
Subjective:  This chart was scribed for [name], MD by Molli Posey, Medical scribe. This patient was seen in ROOM 9 and the patient's care was started 6:50 PM.  Patient ID: Warren [name], male    DOB: May 19, 1955, 59 y.o.   MRN: [id number]   Chief Complaint  Patient presents with  . Follow-up    HFU for PNA--SOB is much better but still feeling weak   HPI HPI Comments: Warren [name] is a 59 y.o. male with a history of COPD and dysphagia who presents to [facility] for follow up for community acquired pneumonia that he was hospitalized for 2 weeks ago. Pt went through a cardiac evaluation for CP and had an ECHO that had a EF of 45-50% with hypokinesis identified. Pt advised to follow up with cardiology in 1 week; his wife reports they have an appointment on 6/6.   Underweight:  Pt's weight was 97 two weeks ago and was advised to take ensure; pt's weight is 101 today. He states that his appetite has been improving. I referred him to pulmonology which he sees on 5/27. On 5/5 intermittent spillage of liquids into pharynx. His wife states that patient's cough has been improving. Pt was advised to crush his pills or take his whole pills in apple sauce or pudding. He states that he is still feeling weak with activity but says that his breathing is the best it has been in months. Pt states that he has been walking and mowing the grass for exercise. Pt states that he has been experiencing some dizziness and light headedness when he stands up; this is a chronic condition for patient with recent worsening since hospitalization. He states that he has a cane but he does not use it. His wife reports that pt has a appointment with the disability office on 5/19. Pt reports that he quit smoking since the hospitalization.  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  Breathing is greatly improved; overall strength is greatly decreased.   Past Medical History  Diagnosis Date  .  Allergic rhinitis   . COPD (chronic obstructive pulmonary disease)   . Testicular atrophy     Right  . Candidal esophagitis     Dr. Benson Norway 10/2012  . Osteoporosis 04/10/2009    L hip fracture; T score -4.2 in 2012.  Fosamax for one year.  . Dysarthria 02/16/2014  . 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 Abnormal MRI of head   . 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 Dysphagia, pharyngoesophageal phase 06/15/2014  . Raynaud disease 06/15/2014    Bilateral hands  . Pneumonia    Patient Active Problem List   Diagnosis Date Noted  . CAP (community acquired pneumonia) 07/25/2014  . Chest pain 07/25/2014  . 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 Dysphagia, pharyngoesophageal phase 06/15/2014  . Raynaud disease 06/15/2014  . Dysarthria 02/16/2014  . 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 BPH (benign prostatic hyperplasia) 09/28/2013  . Personal history of colonic polyps 12/02/2012  . Pneumonia 09/29/2012  . Hyponatremia 09/29/2012  . Loss of weight 09/29/2012  . Anemia 09/17/2012  . COPD (chronic obstructive pulmonary disease) 12/21/2010  . Tobacco abuse 12/21/2010   Current Outpatient Prescriptions on File Prior to Visit  Medication Sig Dispense Refill  . albuterol (PROVENTIL) (2.5 MG/3ML) 0.083% nebulizer solution Take 3 mLs (2.5 mg total) by nebulization every 6 (six) hours as needed for wheezing or shortness of breath. [place]  mL 12  . aspirin EC 81 MG tablet Take 1 tablet (81 mg total) by mouth daily. 30 tablet 0  . feeding supplement, ENSURE ENLIVE, (ENSURE ENLIVE) LIQD Take 237 mLs by mouth daily after supper. 237 mL 12  . fluticasone (FLONASE) 50 MCG/ACT nasal spray Place 2 sprays into both nostrils daily. 2 sprays in each nostril daily as needed 16 g 11  . Fluticasone-Salmeterol (ADVAIR) 250-50 MCG/DOSE AEPB Inhale 1 puff into the lungs 2 (two) times daily. 60 each 11  . Multiple Vitamin (MULTIVITAMIN) tablet Take 1 tablet by mouth daily.      Marland Kitchen Spacer/Aero-Holding Chambers (E-Z SPACER) inhaler Use  as instructed 1 each 2  . tamsulosin (FLOMAX) 0.4 MG CAPS capsule TAKE ONE CAPSULE BY MOUTH EVERY DAY 30 capsule 0  . tiotropium (SPIRIVA HANDIHALER) 18 MCG inhalation capsule Place 1 capsule (18 mcg total) into inhaler and inhale daily. 30 capsule 11  . varenicline (CHANTIX CONTINUING MONTH PAK) 1 MG tablet Take 1 tablet (1 mg total) by mouth 2 (two) times daily. 60 tablet 2   Current Facility-Administered Medications on File Prior to Visit  Medication Dose Route Frequency Provider Last Rate Last Dose  . albuterol (PROVENTIL) (2.5 MG/3ML) 0.083% nebulizer solution 2.5 mg  2.5 mg Nebulization Once Wardell Honour, MD       Allergies  Allergen Reactions  . Contrast Media [Iodinated Diagnostic Agents] Hives and Itching    Happened 40 years ago  . Iodine Hives   Past Surgical History  Procedure Laterality Date  . Nasal fracture surgery    . Vasectomy    . Inguinal hernia repair      bilateral  . Colonoscopy w/ polypectomy  12/07/2006    three polyps sigmoid.  [name]. Repeat 3 years.  . Egd  09/24/2012    hiatal hernia; candidal esophagitis, hematin in stomach; no active source of bleeding.  . Colonoscopy w/ polypectomy  09/24/2012    five sessile polyps removed.  Internal and external hemorrhoids.  . Cataract extraction      BOTH EYES  . Eye surgery  04/10/2014    B cataract surgery   Family History  Problem Relation Age of Onset  . Heart disease Father     valve replacement; CHF; heart transplant candidate  . Hypertension Mother   . Hyperlipidemia Mother   . Diabetes Mother   . Stroke Mother 3    cause of death.  . Diabetes Sister   . Hyperlipidemia Sister   . Stroke Brother    Review of Systems  Constitutional: Positive for fatigue. Negative for fever, chills, diaphoresis, activity change and appetite change.  HENT: Positive for trouble swallowing. Negative for congestion, postnasal drip and rhinorrhea.   Eyes: Negative for visual disturbance.  Respiratory: Negative for  cough and shortness of breath.   Cardiovascular: Negative for chest pain, palpitations and leg swelling.  Gastrointestinal: Negative for vomiting, abdominal pain, diarrhea and constipation.  Endocrine: Negative for cold intolerance, heat intolerance, polydipsia, polyphagia and polyuria.  Musculoskeletal: Positive for gait problem. Negative for myalgias, back pain, joint swelling and arthralgias.  Neurological: Positive for dizziness, speech difficulty, weakness and light-headedness. Negative for tremors, seizures, syncope, facial asymmetry, numbness and headaches.  Psychiatric/Behavioral: Negative for dysphoric mood. The patient is not nervous/anxious.       Objective:   Physical Exam  Constitutional: He is oriented to person, place, and time. He appears well-developed. He appears cachectic. No distress.  HENT:  Head: Normocephalic and atraumatic.  Right Ear:  External ear normal.  Left Ear: External ear normal.  Nose: Nose normal.  Mouth/Throat: Oropharynx is clear and moist.  Left cerumen impaction and left TM not visualized. Right ear normal.   Eyes: Conjunctivae and EOM are normal. Pupils are equal, round, and reactive to light. Right eye exhibits no discharge. Left eye exhibits no discharge.  Neck: Normal range of motion. Neck supple. Carotid bruit is not present. No tracheal deviation present. No thyromegaly present.  Cardiovascular: Normal rate, regular rhythm, normal heart sounds and intact distal pulses.  Exam reveals no gallop and no friction rub.   No murmur heard. Pulmonary/Chest: Effort normal and breath sounds normal. No respiratory distress. He has no wheezes. He has no rales.  Distant breath sounds throughout.   Abdominal: Soft. Bowel sounds are normal. He exhibits no distension and no mass. There is no tenderness. There is no rebound and no guarding.  Lymphadenopathy:    He has no cervical adenopathy.  Neurological: He is alert and oriented to person, place, and time. No  cranial nerve deficit or sensory deficit. He exhibits normal muscle tone. Coordination and gait abnormal.  Slurred speech  Skin: Skin is warm and dry. No rash noted. He is not diaphoretic.  Psychiatric: He has a normal mood and affect. His behavior is normal.  Nursing note and vitals reviewed.  Filed Vitals:   08/20/14 1738  BP: 100/62  Pulse: 79  Temp: 97.7 F (36.5 C)  TempSrc: Oral  Resp: 20  Height: 5' 6.5" (1.689 m)  Weight: 101 lb 6 oz (45.983 kg)  SpO2: 97%   B CERUMEN IRRIGATION PERFORMED IN OFFICE; UNSUCCESSFUL.    Assessment & Plan:   1. CAP (community acquired pneumonia)   2. Anemia in other chronic diseases classified elsewhere   3. Hyponatremia   4. Pulmonary emphysema, unspecified emphysema type   5. Muscular deconditioning   6. Spinocerebellar ataxia type 6   7. Loss of weight   8. Dysphagia, pharyngoesophageal phase   9. Dysarthria   10. Cerumen impaction, bilateral     1. Community Acquired Pneumonia: improved/resolved. Clinically has recovered from pneumonia.  Now dealing with persistent physical deconditioning; recommend increasing daily exercise.  Also cleared to start driving; also encourage regular driving.   2.  Physical deconditioning: New.  Does not have the energy to return to work; increase physical activity and return to driving at home. May warrant formal physical therapy.  Feel that spinocerebellar ataxia with dysphagia and dysarthria also contributing to physical deconditioning. 3.  Anemia: stable; repeat today; chronic issue; colonoscopy and EGD UTD. 4.  COPD: improved; breathing now at baseline; appointment with pulmonology in upcoming month; to keep appointment; no change in therapy at this time.  Contributing to weight loss. 5  Loss of weight; improved with daily Ensure.  6.  Spinocerebellar ataxia with dysphagia and dysarthria: progressively worsening; recent illness with worsening of symptoms; followed by neurology and just underwent  swallowing study that was abnormal.  Feel unable to return to work but will defer to neurology. 7.  Cerumen impaction: s/p unsuccessful cerumen removal in office; will attempt again next week at visit.   No orders of the defined types were placed in this encounter.   I personally performed the services described in this documentation, which was scribed in my presence. The recorded information has been reviewed and considered.  [name] Elayne Guerin, M.D. [facility] [address] [place], [place]  [address] [phone] phone [phone]  fax

## 2014-08-21 LAB — COMPREHENSIVE METABOLIC PANEL
ALK PHOS: 74 U/L (ref 39–117)
ALT: 14 U/L (ref 0–53)
AST: 16 U/L (ref 0–37)
Albumin: 4 g/dL (ref 3.5–5.2)
BILIRUBIN TOTAL: 0.4 mg/dL (ref 0.2–1.2)
BUN: 12 mg/dL (ref 6–23)
CALCIUM: 8.8 mg/dL (ref 8.4–10.5)
CHLORIDE: 97 meq/L (ref 96–112)
CO2: 26 mEq/L (ref 19–32)
Creat: 0.77 mg/dL (ref 0.50–1.35)
Glucose, Bld: 84 mg/dL (ref 70–99)
Potassium: 4.2 mEq/L (ref 3.5–5.3)
Sodium: 131 mEq/L — ABNORMAL LOW (ref 135–145)
TOTAL PROTEIN: 7 g/dL (ref 6.0–8.3)

## 2014-09-04 ENCOUNTER — Ambulatory Visit (INDEPENDENT_AMBULATORY_CARE_PROVIDER_SITE_OTHER): Payer: PRIVATE HEALTH INSURANCE | Admitting: Emergency Medicine

## 2014-09-04 ENCOUNTER — Ambulatory Visit (INDEPENDENT_AMBULATORY_CARE_PROVIDER_SITE_OTHER)
Admission: RE | Admit: 2014-09-04 | Discharge: 2014-09-04 | Disposition: A | Discharge status: Home / Self Care | Payer: PRIVATE HEALTH INSURANCE | Source: Ambulatory Visit | Attending: Emergency Medicine | Admitting: Emergency Medicine

## 2014-09-04 ENCOUNTER — Encounter: Payer: Self-pay | Admitting: Emergency Medicine

## 2014-09-04 VITALS — BP 100/62 | HR 77 | Ht 66.5 in | Wt 103.0 lb

## 2014-09-04 DIAGNOSIS — J449 Chronic obstructive pulmonary disease, unspecified: Secondary | ICD-10-CM

## 2014-09-04 DIAGNOSIS — R471 Dysarthria and anarthria: Secondary | ICD-10-CM | POA: Diagnosis not present

## 2014-09-04 DIAGNOSIS — Z720 Tobacco use: Secondary | ICD-10-CM

## 2014-09-04 DIAGNOSIS — J189 Pneumonia, unspecified organism: Secondary | ICD-10-CM | POA: Diagnosis not present

## 2014-09-04 NOTE — Patient Instructions (Addendum)
We will continue Spiriva and Advair for now. There may be a medication that we can substitute at some point in the future. We will discuss this at a future visit.  Please continue to adhere to your swallowing precautions. These include eating slowly, eating small bites, cutting her food into small bites, tucking your chin or turning your head to swallow, taking water after each bite.  Congratulations on stopping smoking. Try to continue Chantix for at least 1 more month and then stop We will perform walking oximetry today Chest x-ray today Follow with Dr Lamonte Sakai in 3 months or sooner if you have any problems.

## 2014-09-04 NOTE — Progress Notes (Signed)
Subjective:    Patient ID: Warren [name], male    DOB: 06-19-1955, 58 y.o.   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 Old chart note, recent hospital notes and PFT's reviewed > FEV1 was 1.25L (42% predicted) in 01/2011. He was hospitalized in April for PNA and AE-COPD. Had a MBS on 5/9 that confirmed delayed clearance, risk for aspiration but no overt aspiration. He is now on Advair + Spiriva. He uses albuterol nebs every few days. He quit smoking after the hospitalization, is on Chantix. He is feeling better. Notes that he has had frequent PNA's, a few a year. No wheeze or cough at this time. His wife notes that he is slowly putting on some weight. The target weight at this time is greater than 100 pounds     Review of Systems  Constitutional: Negative for fever and unexpected weight change.  HENT: Positive for dental problem and trouble swallowing. Negative for congestion, ear pain, nosebleeds, postnasal drip, rhinorrhea, sinus pressure, sneezing and sore throat.   Eyes: Negative for redness and itching.  Respiratory: Positive for cough and shortness of breath. Negative for chest tightness and wheezing.   Cardiovascular: Negative for palpitations and leg swelling.  Gastrointestinal: Negative for nausea and vomiting.  Genitourinary: Negative for dysuria.  Musculoskeletal: Negative for joint swelling.  Skin: Negative for rash.  Neurological: Negative for headaches.  Hematological: Does not bruise/bleed easily.  Psychiatric/Behavioral: Negative for dysphoric mood. The patient is not nervous/anxious.     Past Medical History  Diagnosis Date  . Allergic rhinitis   . COPD (chronic obstructive pulmonary disease)   . Testicular atrophy     Right  . Candidal esophagitis     Dr. Benson Norway 10/2012  . Osteoporosis 04/10/2009    L hip fracture; T score -4.2 in 2012.  Fosamax  for one year.  . Dysarthria 02/16/2014  . 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 Abnormal MRI of head   . 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 Dysphagia, pharyngoesophageal phase 06/15/2014  . Raynaud disease 06/15/2014    Bilateral hands  . Pneumonia      Family History  Problem Relation Age of Onset  . Heart disease Father     valve replacement; CHF; heart transplant candidate  . Hypertension Mother   . Hyperlipidemia Mother   . Diabetes Mother   . Stroke Mother 76    cause of death.  . Diabetes Sister   . Hyperlipidemia Sister   . Stroke Brother      History   Social History  . Marital Status: Married    Spouse Name: N/A  . Number of Children: 1  . Years of Education: BS   Occupational History  . 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 Smoking status: Former Smoker -- 1.50 packs/day for 30 years    Quit date: 07/25/2014  . Smokeless tobacco: Never Used     Comment: pt says he is smoking 5 or 6 cigarettes daily  . Alcohol Use: No     Comment: 2 beers monthly  . Drug Use: No  . Sexual Activity: Not on file   Other Topics Concern  . Not on file   Social History Narrative   Marital status: married x 31 years.      Children:  One  child (12); no grandchildren.      Lives: with wife, son.  Has a barn; cats.      Employment: unemployed in 2015; previous truck driver x 30 years; hauls beer.  Drives locally     Tobacco: 1 ppd x 30 years      Alcohol:  Rare/none      Drugs: none      Exercise: sporadic      Seatbelt:  100%      Guns:  Unloaded.   Patient is right handed.   Patient drinks 3-4 cups of caffeine daily.     Allergies  Allergen Reactions  . Contrast Media [Iodinated Diagnostic Agents] Hives and Itching    Happened 40 years ago  . Iodine Hives     Outpatient Prescriptions Prior to Visit  Medication Sig Dispense Refill  . albuterol (PROVENTIL) (2.5 MG/3ML) 0.083% nebulizer solution Take 3 mLs (2.5 mg total) by  nebulization every 6 (six) hours as needed for wheezing or shortness of breath. 360 mL 12  . aspirin EC 81 MG tablet Take 1 tablet (81 mg total) by mouth daily. 30 tablet 0  . feeding supplement, ENSURE ENLIVE, (ENSURE ENLIVE) LIQD Take 237 mLs by mouth daily after supper. 237 mL 12  . fluticasone (FLONASE) 50 MCG/ACT nasal spray Place 2 sprays into both nostrils daily. 2 sprays in each nostril daily as needed 16 g 11  . Fluticasone-Salmeterol (ADVAIR) 250-50 MCG/DOSE AEPB Inhale 1 puff into the lungs 2 (two) times daily. 60 each 11  . Multiple Vitamin (MULTIVITAMIN) tablet Take 1 tablet by mouth daily.      Marland Kitchen Spacer/Aero-Holding Chambers (E-Z SPACER) inhaler Use as instructed 1 each 2  . tamsulosin (FLOMAX) 0.4 MG CAPS capsule TAKE ONE CAPSULE BY MOUTH EVERY DAY 30 capsule 0  . tiotropium (SPIRIVA HANDIHALER) 18 MCG inhalation capsule Place 1 capsule (18 mcg total) into inhaler and inhale daily. 30 capsule 11  . varenicline (CHANTIX CONTINUING MONTH PAK) 1 MG tablet Take 1 tablet (1 mg total) by mouth 2 (two) times daily. 60 tablet 2   Facility-Administered Medications Prior to Visit  Medication Dose Route Frequency Provider Last Rate Last Dose  . albuterol (PROVENTIL) (2.5 MG/3ML) 0.083% nebulizer solution 2.5 mg  2.5 mg Nebulization Once Wardell Honour, MD             Objective:   Physical Exam Filed Vitals:   09/04/14 0858  BP: 100/62  Pulse: 77  Height: 5' 6.5" (1.689 m)  Weight: 103 lb (46.72 kg)  SpO2: 95%   Gen: Pleasant, very thin, in no distress,  normal affect  ENT: No lesions,  mouth clear,  oropharynx clear, no postnasal drip  Neck: No JVD, no TMG, no carotid bruits  Lungs: No use of accessory muscles, clear without rales or rhonchi  Cardiovascular: RRR, heart sounds normal, no murmur or gallops, no peripheral edema  Musculoskeletal: No deformities, no cyanosis or clubbing  Neuro: alert, non focal  Skin: Warm, no lesions or rashes     Assessment & Plan:    COPD (chronic obstructive pulmonary disease) With a recent exacerbation in the setting of an apparent pneumonia, likely due to aspiration. He does describe frequent exacerbations, at least 2-3 a year. He is cachectic and I suspect that this is largely due to to his dysphagia and also to his lung disease. He may benefit from a transition to a combination LABA/LAMA. I have some hesitation about this given his frequent exacerbations-he may need  the inhaled steroid. I congratulated to smoking cessation. I asked him to continue Chantix for at least another month. We will repeat his poor function testing in the future   Tobacco abuse He stopped in April. We will continue Chantix for at least one more month.   Dysarthria I discussed swallowing precautions with him in detail today   Pneumonia Recent right-sided pneumonia likely due to aspiration. He is clinically recovered and I will repeat his PA and lateral chest x-ray today to look for radiographic clearance.

## 2014-09-04 NOTE — Assessment & Plan Note (Signed)
Recent right-sided pneumonia likely due to aspiration. He is clinically recovered and I will repeat his PA and lateral chest x-ray today to look for radiographic clearance.

## 2014-09-04 NOTE — Assessment & Plan Note (Signed)
I discussed swallowing precautions with him in detail today

## 2014-09-04 NOTE — Assessment & Plan Note (Signed)
He stopped in April. We will continue Chantix for at least one more month.

## 2014-09-04 NOTE — Assessment & Plan Note (Signed)
With a recent exacerbation in the setting of an apparent pneumonia, likely due to aspiration. He does describe frequent exacerbations, at least 2-3 a year. He is cachectic and I suspect that this is largely due to to his dysphagia and also to his lung disease. He may benefit from a transition to a combination LABA/LAMA. I have some hesitation about this given his frequent exacerbations-he may need the inhaled steroid. I congratulated to smoking cessation. I asked him to continue Chantix for at least another month. We will repeat his poor function testing in the future

## 2014-09-09 NOTE — Telephone Encounter (Signed)
Done

## 2014-09-14 ENCOUNTER — Encounter: Payer: Self-pay | Admitting: Cardiology

## 2014-09-14 ENCOUNTER — Ambulatory Visit (INDEPENDENT_AMBULATORY_CARE_PROVIDER_SITE_OTHER): Payer: PRIVATE HEALTH INSURANCE | Admitting: Cardiology

## 2014-09-14 VITALS — BP 100/68 | HR 99 | Ht 66.0 in | Wt 103.4 lb

## 2014-09-14 DIAGNOSIS — I5042 Chronic combined systolic (congestive) and diastolic (congestive) heart failure: Secondary | ICD-10-CM

## 2014-09-14 DIAGNOSIS — I071 Rheumatic tricuspid insufficiency: Secondary | ICD-10-CM | POA: Diagnosis not present

## 2014-09-14 DIAGNOSIS — J438 Other emphysema: Secondary | ICD-10-CM | POA: Diagnosis not present

## 2014-09-14 NOTE — Progress Notes (Signed)
Cardiology Office Note   Date:  09/14/2014   ID:  Warren [name], DOB 12-22-1955, MRN [id number]  PCP:  [name], MD  Cardiologist: Darlin Coco MD  No chief complaint on file.     History of Present Illness: Warren [name] is a 59 y.o. male who presents for cardiology evaluation.  He was referred from [facility] when he was discharged there in April following pneumonia.  He had presented on 07/25/2014 with chest pain and shortness of breath and was found to have community-acquired pneumonia.  He has severe COPD.  He is a former smoker.  He quit smoking on April 16.  On 07/27/14 the patient had an echocardiogram which showed an ejection fraction of 45-50% with grade 1 diastolic dysfunction and moderate tricuspid regurgitation.  The patient has had no recurrent chest pain since discharge from the hospital and treatment of his pneumonia.  He does have chronic exertional dyspnea.  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  He has not returned to work.  He is exploring the possibility of medical disability.  Since he was discharged he has seen a pulmonary specialist Dr. Lamonte Sakai. The patient denies any palpitations.  He denies any exertional chest pains. He has not been having orthopnea or paroxysmal nocturnal dyspnea or ankle edema. His family history reveals that his mother died of a stroke at age 16.  His mother also had spinocerebellar ataxia type VI.  The patient himself also has this diagnosis. The patient's father died at age 26 of complications every medical heart disease.  He had had a previous valve replacement.  At the time of his death he was on the transplant list for heart transplant. The patient has a history of swallowing difficulty and dysphagia which is part of the spinocerebellar neurologic disease.  He also has poor balance related to his neurologic familial disease.    Past Medical History  Diagnosis Date  . Allergic rhinitis   . COPD (chronic  obstructive pulmonary disease)   . Testicular atrophy     Right  . Candidal esophagitis     Dr. Benson Norway 10/2012  . Osteoporosis 04/10/2009    L hip fracture; T score -4.2 in 2012.  Fosamax for one year.  . Dysarthria 02/16/2014  . 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 Abnormal MRI of head   . 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 Dysphagia, pharyngoesophageal phase 06/15/2014  . Raynaud disease 06/15/2014    Bilateral hands  . Pneumonia     Past Surgical History  Procedure Laterality Date  . Nasal fracture surgery    . Vasectomy    . Inguinal hernia repair      bilateral  . Colonoscopy w/ polypectomy  12/07/2006    three polyps sigmoid.  [name]. Repeat 3 years.  . Egd  09/24/2012    hiatal hernia; candidal esophagitis, hematin in stomach; no active source of bleeding.  . Colonoscopy w/ polypectomy  09/24/2012    five sessile polyps removed.  Internal and external hemorrhoids.  . Cataract extraction      BOTH EYES  . Eye surgery  04/10/2014    B cataract surgery     Current Outpatient Prescriptions  Medication Sig Dispense Refill  . albuterol (PROVENTIL) (2.5 MG/3ML) 0.083% nebulizer solution Take 3 mLs (2.5 mg total) by nebulization every 6 (six) hours as needed for wheezing or shortness of breath. 360 mL 12  . aspirin EC 81 MG tablet Take 1 tablet (  81 mg total) by mouth daily. 30 tablet 0  . feeding supplement, ENSURE ENLIVE, (ENSURE ENLIVE) LIQD Take 237 mLs by mouth daily after supper. 237 mL 12  . fluticasone (FLONASE) 50 MCG/ACT nasal spray Place into both nostrils daily as needed for allergies or rhinitis (for allergies).    . Fluticasone-Salmeterol (ADVAIR) 250-50 MCG/DOSE AEPB Inhale 1 puff into the lungs 2 (two) times daily. 60 each 11  . Multiple Vitamin (MULTIVITAMIN) tablet Take 1 tablet by mouth daily.      Warren [name] Kitchen Spacer/Aero-Holding Chambers (E-Z SPACER) inhaler Use as instructed 1 each 2  . tamsulosin (FLOMAX) 0.4 MG CAPS capsule TAKE ONE CAPSULE BY MOUTH EVERY DAY 30 capsule 0  .  tiotropium (SPIRIVA HANDIHALER) 18 MCG inhalation capsule Place 1 capsule (18 mcg total) into inhaler and inhale daily. 30 capsule 11  . varenicline (CHANTIX CONTINUING MONTH PAK) 1 MG tablet Take 1 tablet (1 mg total) by mouth 2 (two) times daily. 60 tablet 2   Current Facility-Administered Medications  Medication Dose Route Frequency Provider Last Rate Last Dose  . albuterol (PROVENTIL) (2.5 MG/3ML) 0.083% nebulizer solution 2.5 mg  2.5 mg Nebulization Once Warren Honour, MD        Allergies:   Contrast media and Iodine    Social History:  The patient  reports that he quit smoking about 7 weeks ago. He has never used smokeless tobacco. He reports that he does not drink alcohol or use illicit drugs.   Family History:  The patient's family history includes Diabetes in his mother and sister; Heart disease in his father; Hyperlipidemia in his mother and sister; Hypertension in his mother; Stroke in his brother; Stroke (age of onset: 61) in his mother.    ROS:  Please see the history of present illness.   Otherwise, review of systems are positive for none.   All other systems are reviewed and negative.    PHYSICAL EXAM: VS:  BP 100/68 mmHg  Pulse 99  Ht 5\' 6"  (1.676 m)  Wt 103 lb 6.4 oz (46.902 kg)  BMI 16.70 kg/m2  SpO2 96% , BMI Body mass index is 16.7 kg/(m^2). GEN: Thin, cachectic middle-aged gentleman in no acute distress. HEENT: normal Neck: no JVD, carotid bruits, or masses Cardiac: RRR; no murmurs, rubs, or gallops,no edema  Respiratory:  clear to auscultation bilaterally, the patient is tachypnea At rest.  Breath sounds are distant GI: soft, nontender, nondistended, + BS MS: no deformity or atrophy Skin: warm and dry, no rash Neuro:  Strength and sensation are intact Psych: euthymic mood, full affect   EKG:  EKG is ordered today. The ekg ordered today demonstrates normal sinus rhythm at 99 bpm.  There are peaked P waves consistent with P pulmonale there is a rightward  QRS axis , all consistent with severe COPD   Recent Labs: 07/25/2014: B Natriuretic Peptide 163.0* 07/26/2014: TSH 0.893 07/28/2014: Platelets 459* 08/20/2014: ALT 14; BUN 12; Creatinine 0.77; Hemoglobin 10.8*; Potassium 4.2; Sodium 131*    Lipid Panel    Component Value Date/Time   CHOL 159 09/28/2013 1324   TRIG 61 09/28/2013 1324   HDL 53 09/28/2013 1324   CHOLHDL 3.0 09/28/2013 1324   VLDL 12 09/28/2013 1324   LDLCALC 94 09/28/2013 1324      Wt Readings from Last 3 Encounters:  09/14/14 103 lb 6.4 oz (46.902 kg)  09/04/14 103 lb (46.72 kg)  08/20/14 101 lb 6 oz (45.983 kg)        ASSESSMENT  AND PLAN:  1.  Severe COPD. 2.  Chronic low blood pressure 3.  Cachexia 4.  Tricuspid regurgitation by echo and P pulmonale on EKG. 5.  Combined mild systolic left ventricular dysfunction and mild diastolic left ventricular dysfunction by echocardiogram 07/27/14.   Current medicines are reviewed at length with the patient today.  The patient does not have concerns regarding medicines.  The following changes have been made:  no change  Labs/ tests ordered today include:   Orders Placed This Encounter  Procedures  . EKG 12-Lead    Recommendation: No new medications added today.  Ideally we would consider adding low-dose beta blocker and low-dose ACE inhibitor but his blood pressure is too low to accommodate that.  No ischemic workup indicated at this time.  The primary challenge at this point is to be sure that he remains off of cigarettes and to maximize his remaining lung function.  Continue daily aspirin 81 mg.  Return here when necessary.  Continue close follow-up with pulmonary.  Berna Spare MD 09/14/2014 1:03 PM    [facility] [address], [place], [place]  [address] Phone: [phone]; Fax: [phone]

## 2014-09-14 NOTE — Patient Instructions (Signed)
Medication Instructions:  .Your physician recommends that you continue on your current medications as directed. Please refer to the Current Medication list given to you today.  Labwork: none  Testing/Procedures: none  Follow-Up: As needed   

## 2014-09-17 DIAGNOSIS — Z0289 Encounter for other administrative examinations: Secondary | ICD-10-CM

## 2014-10-09 DIAGNOSIS — Z0271 Encounter for disability determination: Secondary | ICD-10-CM

## 2014-12-08 ENCOUNTER — Telehealth: Payer: Self-pay | Admitting: Emergency Medicine

## 2014-12-08 NOTE — Telephone Encounter (Signed)
Notes Recorded by Collene Gobble, MD on 12/05/2014 at 10:02 PM Pt is due for follow-up, please call to schedule with CXR prior to appt for f/u PNA ------------------- lmtcb x1 for pt.

## 2014-12-09 NOTE — Telephone Encounter (Signed)
Patient returned call, may be reached at [phone]

## 2014-12-09 NOTE — Telephone Encounter (Signed)
lmtcb x2 for pt. 

## 2014-12-09 NOTE — Telephone Encounter (Signed)
Spoke with the pt  He refused to make appt b/c he has no ins at this time  Will forward to RB to make him aware

## 2015-03-01 ENCOUNTER — Other Ambulatory Visit: Payer: Self-pay | Admitting: Family Medicine

## 2015-04-19 ENCOUNTER — Telehealth: Payer: Self-pay | Admitting: Emergency Medicine

## 2015-04-19 NOTE — Telephone Encounter (Signed)
Called and spoke to pt. Pt stated he can no longer afford his Spiriva and Advair because he lost his job. Advised pt he can complete pt assistance forms and provide samples in the mean time. Both pt assistance forms placed up front for pick up along with Advair sample. Pt verbalized understanding and denied any further questions or concerns at this time.

## 2015-05-10 ENCOUNTER — Telehealth: Payer: Self-pay | Admitting: Emergency Medicine

## 2015-05-11 ENCOUNTER — Telehealth: Payer: Self-pay | Admitting: Emergency Medicine

## 2015-05-11 MED ORDER — TIOTROPIUM BROMIDE MONOHYDRATE 18 MCG IN CAPS: 18.0000 ug | ORAL_CAPSULE | Freq: Every day | RESPIRATORY_TRACT | Status: DC

## 2015-05-11 MED ORDER — FLUTICASONE-SALMETEROL 250-50 MCG/DOSE IN AEPB: 1.0000 | INHALATION_SPRAY | Freq: Two times a day (BID) | RESPIRATORY_TRACT | Status: DC

## 2015-05-11 NOTE — Telephone Encounter (Signed)
lmtcb x1 for pt. He dropped off patient assistance forms for Spiriva and Advair. The forms for Spiriva were not signed by the pt. He will need to come by and sign this. Forms will be in RB's look at cubby.

## 2015-05-11 NOTE — Telephone Encounter (Signed)
Spoke with pt. States that he needs patient assistance for Advair and Spiriva Handihaler. Forms were filled out by the patient. They will be signed by RB and sent in with prescriptions. ROV has been scheduled for 06/17/2015. Nothing further was needed at this time.

## 2015-05-13 NOTE — Telephone Encounter (Signed)
Spoke with pt. He will come by this afternoon and sign this form.

## 2015-05-13 NOTE — Telephone Encounter (Signed)
Spoke with pt. BI pt assistance forms have been signed and faxed and placed back in RB's lookat's. Pt aware we will contact him when we receive updates.   Will forward to [place] to follow.

## 2015-05-13 NOTE — Telephone Encounter (Signed)
PATIENT IN LOBBY here to sign forms.  There is nothing in the folder up front for him to sign.

## 2015-06-14 ENCOUNTER — Ambulatory Visit: Payer: PRIVATE HEALTH INSURANCE | Admitting: Neurology

## 2015-06-17 ENCOUNTER — Encounter: Payer: Self-pay | Admitting: Emergency Medicine

## 2015-06-17 ENCOUNTER — Ambulatory Visit (INDEPENDENT_AMBULATORY_CARE_PROVIDER_SITE_OTHER): Payer: BLUE CROSS/BLUE SHIELD | Admitting: Emergency Medicine

## 2015-06-17 VITALS — BP 104/68 | HR 95 | Ht 66.0 in | Wt 103.8 lb

## 2015-06-17 DIAGNOSIS — J449 Chronic obstructive pulmonary disease, unspecified: Secondary | ICD-10-CM | POA: Diagnosis not present

## 2015-06-17 NOTE — Progress Notes (Signed)
   Subjective:    Patient ID: Warren [name], male    DOB: 05/20/1955, 60 y.o.   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 Old chart note, recent hospital notes and PFT's reviewed > FEV1 was 1.25L (42% predicted) in 01/2011. He was hospitalized in April for PNA and AE-COPD. Had a MBS on 5/9 that confirmed delayed clearance, risk for aspiration but no overt aspiration. He is now on Advair + Spiriva. He uses albuterol nebs every few days. He quit smoking after the hospitalization, is on Chantix. He is feeling better. Notes that he has had frequent PNA's, a few a year. No wheeze or cough at this time. His wife notes that he is slowly putting on some weight. 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 Last seen in May 2016. He has been managed on Spiriva and Advair, but he is not on these meds right now due to cost. He has been off for several months. He is taking albuterol nebs bid, they help him but he prefers the long acting meds. No hospitalizations since last time. Coughs every day, dry cough.    Review of Systems  Constitutional: Negative for fever and unexpected weight change.  HENT: Positive for dental problem and trouble swallowing. Negative for congestion, ear pain, nosebleeds, postnasal drip, rhinorrhea, sinus pressure, sneezing and sore throat.   Eyes: Negative for redness and itching.  Respiratory: Positive for cough and shortness of breath. Negative for chest tightness and wheezing.   Cardiovascular: Negative for palpitations and leg swelling.  Gastrointestinal: Negative for nausea and vomiting.  Genitourinary: Negative for dysuria.  Musculoskeletal: Negative for joint swelling.  Skin: Negative for rash.  Neurological: Negative for  headaches.  Hematological: Does not bruise/bleed easily.  Psychiatric/Behavioral: Negative for dysphoric mood. The patient is not nervous/anxious.          Objective:   Physical Exam Filed Vitals:   06/17/15 1348  BP: 104/68  Pulse: 95  Height: 5\' 6"  (1.676 m)  Weight: 103 lb 12.8 oz (47.083 kg)  SpO2: 96%   Gen: Pleasant, very thin, in no distress,  normal affect  ENT: No lesions,  mouth clear,  oropharynx clear, no postnasal drip  Neck: No JVD, no TMG, no carotid bruits  Lungs: No use of accessory muscles, clear without rales or rhonchi  Cardiovascular: RRR, heart sounds normal, no murmur or gallops, no peripheral edema  Musculoskeletal: No deformities, no cyanosis or clubbing  Neuro: alert, non focal  Skin: Warm, no lesions or rashes     Assessment & Plan:  COPD (chronic obstructive pulmonary disease) He's been off medications for several months. Clearly he is doing worse. I discussed with him the options for restarting bronchodilators. We will try restarting Anoro and see if he qualifies for patient assistance from pharmaceutical company. It would appear that he was rejected in the past because he had prescription drug coverage through his insurance. If we are unable to use a long-acting bronchodilator then I'll switch him to DuoNeb 4 times a day

## 2015-06-17 NOTE — Assessment & Plan Note (Signed)
He's been off medications for several months. Clearly he is doing worse. I discussed with him the options for restarting bronchodilators. We will try restarting Anoro and see if he qualifies for patient assistance from pharmaceutical company. It would appear that he was rejected in the past because he had prescription drug coverage through his insurance. If we are unable to use a long-acting bronchodilator then I'll switch him to DuoNeb 4 times a day

## 2015-06-17 NOTE — Patient Instructions (Signed)
We will start you on Anoro once a day. We will work to see if we can get you financial assistance through the [employer]. Congratulations  on staying off cigarettes. Follow with Dr Lamonte Sakai in 3 months or sooner if you have any problems.

## 2015-06-18 ENCOUNTER — Other Ambulatory Visit: Payer: Self-pay | Admitting: *Deleted

## 2015-06-18 MED ORDER — UMECLIDINIUM-VILANTEROL 62.5-25 MCG/INH IN AEPB: 1.0000 | INHALATION_SPRAY | Freq: Every day | RESPIRATORY_TRACT | Status: DC

## 2015-07-14 ENCOUNTER — Ambulatory Visit (INDEPENDENT_AMBULATORY_CARE_PROVIDER_SITE_OTHER): Payer: BLUE CROSS/BLUE SHIELD

## 2015-07-14 ENCOUNTER — Ambulatory Visit (INDEPENDENT_AMBULATORY_CARE_PROVIDER_SITE_OTHER): Payer: BLUE CROSS/BLUE SHIELD | Admitting: Family Medicine

## 2015-07-14 VITALS — BP 126/88 | HR 78 | Temp 97.8°F | Resp 18 | Ht 67.0 in | Wt 103.0 lb

## 2015-07-14 DIAGNOSIS — N3941 Urge incontinence: Secondary | ICD-10-CM | POA: Diagnosis not present

## 2015-07-14 DIAGNOSIS — R0602 Shortness of breath: Secondary | ICD-10-CM | POA: Diagnosis not present

## 2015-07-14 DIAGNOSIS — R51 Headache: Secondary | ICD-10-CM

## 2015-07-14 DIAGNOSIS — J441 Chronic obstructive pulmonary disease with (acute) exacerbation: Secondary | ICD-10-CM

## 2015-07-14 DIAGNOSIS — R0981 Nasal congestion: Secondary | ICD-10-CM

## 2015-07-14 DIAGNOSIS — R109 Unspecified abdominal pain: Secondary | ICD-10-CM | POA: Diagnosis not present

## 2015-07-14 DIAGNOSIS — J449 Chronic obstructive pulmonary disease, unspecified: Secondary | ICD-10-CM | POA: Diagnosis not present

## 2015-07-14 LAB — POCT URINALYSIS DIP (MANUAL ENTRY)
BILIRUBIN UA: NEGATIVE
BILIRUBIN UA: NEGATIVE
Glucose, UA: NEGATIVE
LEUKOCYTES UA: NEGATIVE
Nitrite, UA: NEGATIVE
PH UA: 7
Protein Ur, POC: NEGATIVE
SPEC GRAV UA: 1.01
Urobilinogen, UA: 0.2

## 2015-07-14 LAB — POCT CBC
GRANULOCYTE PERCENT: 66.2 % (ref 37–80)
HCT, POC: 40.8 % — AB (ref 43.5–53.7)
Hemoglobin: 14.3 g/dL (ref 14.1–18.1)
LYMPH, POC: 1.8 (ref 0.6–3.4)
MCH: 33 pg — AB (ref 27–31.2)
MCHC: 35.1 g/dL (ref 31.8–35.4)
MCV: 93.9 fL (ref 80–97)
MID (cbc): 0.6 (ref 0–0.9)
MPV: 6.5 fL (ref 0–99.8)
POC Granulocyte: 4.8 (ref 2–6.9)
POC LYMPH PERCENT: 25.3 %L (ref 10–50)
POC MID %: 8.5 % (ref 0–12)
Platelet Count, POC: 281 10*3/uL (ref 142–424)
RBC: 4.35 M/uL — AB (ref 4.69–6.13)
RDW, POC: 13.3 %
WBC: 7.2 10*3/uL (ref 4.6–10.2)

## 2015-07-14 LAB — POCT INFLUENZA A/B
INFLUENZA A, POC: NEGATIVE
Influenza B, POC: NEGATIVE

## 2015-07-14 MED ORDER — PREDNISONE 20 MG PO TABS: ORAL_TABLET | ORAL | Status: DC

## 2015-07-14 MED ORDER — ALBUTEROL SULFATE (2.5 MG/3ML) 0.083% IN NEBU
2.5000 mg | INHALATION_SOLUTION | Freq: Once | RESPIRATORY_TRACT | Status: AC
  Administered 2015-07-14: 2.5 mg via RESPIRATORY_TRACT

## 2015-07-14 MED ORDER — IPRATROPIUM BROMIDE 0.02 % IN SOLN: 0.5000 mg | Freq: Four times a day (QID) | RESPIRATORY_TRACT | Status: DC

## 2015-07-14 MED ORDER — ALBUTEROL SULFATE (2.5 MG/3ML) 0.083% IN NEBU: 2.5000 mg | INHALATION_SOLUTION | Freq: Four times a day (QID) | RESPIRATORY_TRACT | Status: DC | PRN

## 2015-07-14 MED ORDER — METHYLPREDNISOLONE SODIUM SUCC 125 MG IJ SOLR
80.0000 mg | Freq: Once | INTRAMUSCULAR | Status: AC
  Administered 2015-07-14: 80 mg via INTRAMUSCULAR

## 2015-07-14 MED ORDER — AMOXICILLIN-POT CLAVULANATE 875-125 MG PO TABS: 1.0000 | ORAL_TABLET | Freq: Two times a day (BID) | ORAL | Status: DC

## 2015-07-14 NOTE — Patient Instructions (Addendum)
1. Increase Albuterol nebulizer to four times daily. 2. Take Prednisone taper as prescribed. 3.  Take Augmentin as prescribed. 4. Start Atrovent nebulizer when you run out of Anora.   IF you received an x-ray today, you will receive an invoice from [facility]. Please contact [facility] at [phone] with questions or concerns regarding your invoice.   IF you received labwork today, you will receive an invoice from [employer]. Please contact [facility] at [phone] with questions or concerns regarding your invoice.   Our billing staff will not be able to assist you with questions regarding bills from these companies.  You will be contacted with the lab results as soon as they are available. The fastest way to get your results is to activate your My Chart account. Instructions are located on the last page of this paperwork. If you have not heard from [place] regarding the results in 2 weeks, please contact this office.     Chronic Obstructive Pulmonary Disease Chronic obstructive pulmonary disease (COPD) is a common lung condition in which airflow from the lungs is limited. COPD is a general term that can be used to describe many different lung problems that limit airflow, including both chronic bronchitis and emphysema. If you have COPD, your lung function will probably never return to normal, but there are measures you can take to improve lung function and make yourself feel better. CAUSES   Smoking (common).  Exposure to secondhand smoke.  Genetic problems.  Chronic inflammatory lung diseases or recurrent infections. SYMPTOMS  Shortness of breath, especially with physical activity.  Deep, persistent (chronic) cough with a large amount of thick mucus.  Wheezing.  Rapid breaths (tachypnea).  Gray or bluish discoloration (cyanosis) of the skin, especially in your fingers, toes, or lips.  Fatigue.  Weight loss.  Frequent  infections or episodes when breathing symptoms become much worse (exacerbations).  Chest tightness. DIAGNOSIS Your health care provider will take a medical history and perform a physical examination to diagnose COPD. Additional tests for COPD may include:  Lung (pulmonary) function tests.  Chest X-ray.  CT scan.  Blood tests. TREATMENT  Treatment for COPD may include:  Inhaler and nebulizer medicines. These help manage the symptoms of COPD and make your breathing more comfortable.  Supplemental oxygen. Supplemental oxygen is only helpful if you have a low oxygen level in your blood.  Exercise and physical activity. These are beneficial for nearly all people with COPD.  Lung surgery or transplant.  Nutrition therapy to gain weight, if you are underweight.  Pulmonary rehabilitation. This may involve working with a team of health care providers and specialists, such as respiratory, occupational, and physical therapists. HOME CARE INSTRUCTIONS  Take all medicines (inhaled or pills) as directed by your health care provider.  Avoid over-the-counter medicines or cough syrups that dry up your airway (such as antihistamines) and slow down the elimination of secretions unless instructed otherwise by your health care provider.  If you are a smoker, the most important thing that you can do is stop smoking. Continuing to smoke will cause further lung damage and breathing trouble. Ask your health care provider for help with quitting smoking. He or she can direct you to community resources or hospitals that provide support.  Avoid exposure to irritants such as smoke, chemicals, and fumes that aggravate your breathing.  Use oxygen therapy and pulmonary rehabilitation if directed by your health care provider. If you require home oxygen therapy, ask your health care provider whether  you should purchase a pulse oximeter to measure your oxygen level at home.  Avoid contact with individuals who  have a contagious illness.  Avoid extreme temperature and humidity changes.  Eat healthy foods. Eating smaller, more frequent meals and resting before meals may help you maintain your strength.  Stay active, but balance activity with periods of rest. Exercise and physical activity will help you maintain your ability to do things you want to do.  Preventing infection and hospitalization is very important when you have COPD. Make sure to receive all the vaccines your health care provider recommends, especially the pneumococcal and influenza vaccines. Ask your health care provider whether you need a pneumonia vaccine.  Learn and use relaxation techniques to manage stress.  Learn and use controlled breathing techniques as directed by your health care provider. Controlled breathing techniques include:  Pursed lip breathing. Start by breathing in (inhaling) through your nose for 1 second. Then, purse your lips as if you were going to whistle and breathe out (exhale) through the pursed lips for 2 seconds.  Diaphragmatic breathing. Start by putting one hand on your abdomen just above your waist. Inhale slowly through your nose. The hand on your abdomen should move out. Then purse your lips and exhale slowly. You should be able to feel the hand on your abdomen moving in as you exhale.  Learn and use controlled coughing to clear mucus from your lungs. Controlled coughing is a series of short, progressive coughs. The steps of controlled coughing are: 1. Lean your head slightly forward. 2. Breathe in deeply using diaphragmatic breathing. 3. Try to hold your breath for 3 seconds. 4. Keep your mouth slightly open while coughing twice. 5. Spit any mucus out into a tissue. 6. Rest and repeat the steps once or twice as needed. SEEK MEDICAL CARE IF:  You are coughing up more mucus than usual.  There is a change in the color or thickness of your mucus.  Your breathing is more labored than usual.  Your  breathing is faster than usual. SEEK IMMEDIATE MEDICAL CARE IF:  You have shortness of breath while you are resting.  You have shortness of breath that prevents you from:  Being able to talk.  Performing your usual physical activities.  You have chest pain lasting longer than 5 minutes.  Your skin color is more cyanotic than usual.  You measure low oxygen saturations for longer than 5 minutes with a pulse oximeter. MAKE SURE YOU:  Understand these instructions.  Will watch your condition.  Will get help right away if you are not doing well or get worse.   This information is not intended to replace advice given to you by your health care provider. Make sure you discuss any questions you have with your health care provider.   Document Released: 01/04/2005 Document Revised: 04/17/2014 Document Reviewed: 11/21/2012 Elsevier Interactive Patient Education [employer].

## 2015-07-14 NOTE — Progress Notes (Signed)
Subjective:    Patient ID: Warren [name], male    DOB: [birth year], 60 y.o.   MRN: [id number]  07/14/2015  Shortness of Breath   HPI This 60 y.o. male presents for evaluation of SOB.  Trying to get from bed to bathroom causes SOB; most can ambulate.  Onset two weeks ago.  Can get to the couch but that is it.  Unable to ambulate freely throughout the house.  No fever but +chills/sweats.  +clammy with SOB.  +HA.  No ear pain or sore throat.  No rhinorrhea; +nasal congestion; congestion is mucous clear.  +coughing moderate amount.  Unable to produce sputum.  Using nebulizer twice daily.  Julieta Gutting with great benefit for one week only; [place] discussed that may need to take another medication due to expense.  Anora samples; will run out in six days.  Next appointment with Bynum in two months.  No v/d.  Had one episode of urinary incontinence with breathing episode.  No chest pain.  +stomach pain from breathing hard.  Epagstric pain with walking around.  Last nebulizer use at noon today.     Review of Systems  Constitutional: Positive for chills and diaphoresis. Negative for fever, activity change, appetite change and fatigue.  HENT: Positive for congestion and rhinorrhea. Negative for ear pain, sinus pressure and sore throat.   Respiratory: Positive for shortness of breath and wheezing. Negative for cough.   Cardiovascular: Negative for chest pain, palpitations and leg swelling.  Gastrointestinal: Positive for abdominal pain. Negative for nausea, vomiting, diarrhea and abdominal distention.  Endocrine: Negative for cold intolerance, heat intolerance, polydipsia, polyphagia and polyuria.  Skin: Negative for color change, rash and wound.  Neurological: Positive for headaches. Negative for dizziness, tremors, seizures, syncope, facial asymmetry, speech difficulty, weakness, light-headedness and numbness.  Psychiatric/Behavioral: Negative for sleep disturbance and dysphoric mood. The patient is not  nervous/anxious.     Past Medical History  Diagnosis Date  . Allergic rhinitis   . COPD (chronic obstructive pulmonary disease) ([place])   . Testicular atrophy     Right  . Candidal esophagitis (HCC)     Dr. Benson Norway 10/2012  . Osteoporosis 04/10/2009    L hip fracture; T score -4.2 in 2012.  Fosamax for one year.  . Dysarthria 02/16/2014  . 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 Abnormal MRI of head   . 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 Dysphagia, pharyngoesophageal phase 06/15/2014  . Raynaud disease 06/15/2014    Bilateral hands  . Pneumonia    Past Surgical History  Procedure Laterality Date  . Nasal fracture surgery    . Vasectomy    . Inguinal hernia repair      bilateral  . Colonoscopy w/ polypectomy  12/07/2006    three polyps sigmoid.  [name]. Repeat 3 years.  . Egd  09/24/2012    hiatal hernia; candidal esophagitis, hematin in stomach; no active source of bleeding.  . Colonoscopy w/ polypectomy  09/24/2012    five sessile polyps removed.  Internal and external hemorrhoids.  . Cataract extraction      BOTH EYES  . Eye surgery  04/10/2014    B cataract surgery   Allergies  Allergen Reactions  . Contrast Media [Iodinated Diagnostic Agents] Hives and Itching    Happened 40 years ago  . Iodine Hives    Social History   Social History  . Marital Status: Married    Spouse Name: N/A  . Number of Children: 1  .  Years of Education: BS   Occupational History  . 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 Smoking status: Former Smoker -- 1.50 packs/day for 30 years    Quit date: 07/25/2014  . Smokeless tobacco: Never Used     Comment: pt says he is smoking 5 or 6 cigarettes daily  . Alcohol Use: No     Comment: 2 beers monthly  . Drug Use: No  . Sexual Activity: Not on file   Other Topics Concern  . Not on file   Social History Narrative   Marital status: married x 31 years.      Children:  One child (25); no grandchildren.       Lives: with wife, son.  Has a barn; cats.      Employment: unemployed in 2015; previous truck driver x 30 years; hauls beer.  Drives locally     Tobacco: 1 ppd x 30 years      Alcohol:  Rare/none      Drugs: none      Exercise: sporadic      Seatbelt:  100%      Guns:  Unloaded.   Patient is right handed.   Patient drinks 3-4 cups of caffeine daily.   Family History  Problem Relation Age of Onset  . Heart disease Father     valve replacement; CHF; heart transplant candidate  . Hypertension Mother   . Hyperlipidemia Mother   . Diabetes Mother   . Stroke Mother 17    cause of death.  . Diabetes Sister   . Hyperlipidemia Sister   . Stroke Brother        Objective:    BP 126/88 mmHg  Pulse 78  Temp(Src) 97.8 F (36.6 C) (Oral)  Resp 18  Ht 5\' 7"  (1.702 m)  Wt 103 lb (46.72 kg)  BMI 16.13 kg/m2  SpO2 95% Physical Exam  Constitutional: He is oriented to person, place, and time. He appears well-developed and well-nourished. No distress.  HENT:  Head: Normocephalic and atraumatic.  Right Ear: External ear normal.  Left Ear: External ear normal.  Nose: Nose normal.  Mouth/Throat: Oropharynx is clear and moist.  Eyes: Conjunctivae and EOM are normal. Pupils are equal, round, and reactive to light.  Neck: Normal range of motion. Neck supple. Carotid bruit is not present. No thyromegaly present.  Cardiovascular: Normal rate, regular rhythm, normal heart sounds and intact distal pulses.  Exam reveals no gallop and no friction rub.   No murmur heard. Pulmonary/Chest: Effort normal and breath sounds normal. He has no wheezes. He has no rales.  Abdominal: Soft. Bowel sounds are normal. He exhibits no distension and no mass. There is no tenderness. There is no rebound and no guarding.  Lymphadenopathy:    He has no cervical adenopathy.  Neurological: He is alert and oriented to person, place, and time. No cranial nerve deficit.  Skin: Skin is warm and dry. No rash noted. He is  not diaphoretic.  Psychiatric: He has a normal mood and affect. His behavior is normal.  Nursing note and vitals reviewed.  Results for orders placed or performed in visit on 07/14/15  Urine culture  Result Value Ref Range   Colony Count NO GROWTH    Organism ID, Bacteria NO GROWTH   Comprehensive metabolic panel  Result Value Ref Range   Sodium 134 (L) 135 - 146 mmol/L   Potassium 4.7 3.5 - 5.3  mmol/L   Chloride 97 (L) 98 - 110 mmol/L   CO2 24 20 - 31 mmol/L   Glucose, Bld 96 65 - 99 mg/dL   BUN 6 (L) 7 - 25 mg/dL   Creat 0.77 0.70 - 1.25 mg/dL   Total Bilirubin 0.8 0.2 - 1.2 mg/dL   Alkaline Phosphatase 66 40 - 115 U/L   AST 17 10 - 35 U/L   ALT 14 9 - 46 U/L   Total Protein 7.9 6.1 - 8.1 g/dL   Albumin 4.6 3.6 - 5.1 g/dL   Calcium 9.8 8.6 - 10.3 mg/dL  POCT CBC  Result Value Ref Range   WBC 7.2 4.6 - 10.2 K/uL   Lymph, poc 1.8 0.6 - 3.4   POC LYMPH PERCENT 25.3 10 - 50 %L   MID (cbc) 0.6 0 - 0.9   POC MID % 8.5 0 - 12 %M   POC Granulocyte 4.8 2 - 6.9   Granulocyte percent 66.2 37 - 80 %G   RBC 4.35 (A) 4.69 - 6.13 M/uL   Hemoglobin 14.3 14.1 - 18.1 g/dL   HCT, POC 40.8 (A) 43.5 - 53.7 %   MCV 93.9 80 - 97 fL   MCH, POC 33.0 (A) 27 - 31.2 pg   MCHC 35.1 31.8 - 35.4 g/dL   RDW, POC 13.3 %   Platelet Count, POC 281 142 - 424 K/uL   MPV 6.5 0 - 99.8 fL  POCT Influenza A/B  Result Value Ref Range   Influenza A, POC Negative Negative   Influenza B, POC Negative Negative  POCT urinalysis dipstick  Result Value Ref Range   Color, UA yellow yellow   Clarity, UA clear clear   Glucose, UA negative negative   Bilirubin, UA negative negative   Ketones, POC UA negative negative   Spec Grav, UA 1.010    Blood, UA trace-intact (A) negative   pH, UA 7.0    Protein Ur, POC negative negative   Urobilinogen, UA 0.2    Nitrite, UA Negative Negative   Leukocytes, UA Negative Negative   Dg Chest 2 View  07/14/2015  CLINICAL DATA:  Short of breath.  COPD exacerbation EXAM:  CHEST  2 VIEW COMPARISON:  09/04/2014 FINDINGS: Marked hyperinflation compatible with advanced COPD. Diffuse pulmonary scarring is present most prominent in the right apex. This is stable. Negative for heart failure. Negative for pneumonia. Interval clearing of lingular infiltrate since the prior study. No mass or effusion. IMPRESSION: Severe COPD with scarring in the right apex. No superimposed acute abnormality. Electronically Signed   By: Franchot Gallo M.D.   On: 07/14/2015 16:28      Assessment & Plan:   1. COPD exacerbation ([place])   2. Shortness of breath   3. Urge incontinence of urine   4. Chronic obstructive pulmonary disease, unspecified COPD, unspecified chronic bronchitis type     Orders Placed This Encounter  Procedures  . Urine culture  . DG Chest 2 View    Standing Status: Future     Number of Occurrences: 1     Standing Expiration Date: 07/13/2016    Order Specific Question:  Reason for Exam (SYMPTOM  OR DIAGNOSIS REQUIRED)    Answer:  SOB, COPD exacerbation; orthopnea    Order Specific Question:  Preferred imaging location?    Answer:  External  . Comprehensive metabolic panel  . POCT CBC  . POCT Influenza A/B  . POCT urinalysis dipstick  . EKG 12-Lead   Meds  ordered this encounter  Medications  . albuterol (PROVENTIL) (2.5 MG/3ML) 0.083% nebulizer solution 2.5 mg    Sig:   . methylPREDNISolone sodium succinate (SOLU-MEDROL) 125 mg/2 mL injection 80 mg    Sig:   . albuterol (PROVENTIL) (2.5 MG/3ML) 0.083% nebulizer solution    Sig: Take 3 mLs (2.5 mg total) by nebulization every 6 (six) hours as needed for wheezing or shortness of breath.    Dispense:  360 mL    Refill:  12  . ipratropium (ATROVENT) 0.02 % nebulizer solution    Sig: Take 2.5 mLs (0.5 mg total) by nebulization 4 (four) times daily.    Dispense:  360 mL    Refill:  12  . predniSONE (DELTASONE) 20 MG tablet    Sig: Three tablets daily x 2 days then two tablets daily x 5 days then one tablet daily  x 5 days    Dispense:  21 tablet    Refill:  0  . amoxicillin-clavulanate (AUGMENTIN) 875-125 MG tablet    Sig: Take 1 tablet by mouth 2 (two) times daily.    Dispense:  20 tablet    Refill:  0    No Follow-up on file.    Jujuan Dugo Elayne Guerin, M.D. [facility] [address] [place], [place]  [address] [phone] phone [phone] fax

## 2015-07-15 LAB — URINE CULTURE
Colony Count: NO GROWTH
ORGANISM ID, BACTERIA: NO GROWTH

## 2015-07-15 LAB — COMPREHENSIVE METABOLIC PANEL
ALT: 14 U/L (ref 9–46)
AST: 17 U/L (ref 10–35)
Albumin: 4.6 g/dL (ref 3.6–5.1)
Alkaline Phosphatase: 66 U/L (ref 40–115)
BUN: 6 mg/dL — ABNORMAL LOW (ref 7–25)
CHLORIDE: 97 mmol/L — AB (ref 98–110)
CO2: 24 mmol/L (ref 20–31)
Calcium: 9.8 mg/dL (ref 8.6–10.3)
Creat: 0.77 mg/dL (ref 0.70–1.25)
GLUCOSE: 96 mg/dL (ref 65–99)
POTASSIUM: 4.7 mmol/L (ref 3.5–5.3)
Sodium: 134 mmol/L — ABNORMAL LOW (ref 135–146)
Total Bilirubin: 0.8 mg/dL (ref 0.2–1.2)
Total Protein: 7.9 g/dL (ref 6.1–8.1)

## 2015-08-27 ENCOUNTER — Ambulatory Visit (INDEPENDENT_AMBULATORY_CARE_PROVIDER_SITE_OTHER): Payer: BLUE CROSS/BLUE SHIELD | Admitting: Adult Health

## 2015-08-27 ENCOUNTER — Encounter: Payer: Self-pay | Admitting: Adult Health

## 2015-08-27 ENCOUNTER — Telehealth: Payer: Self-pay | Admitting: Emergency Medicine

## 2015-08-27 VITALS — BP 106/78 | HR 100 | Temp 97.8°F | Ht 67.0 in | Wt 102.0 lb

## 2015-08-27 DIAGNOSIS — J441 Chronic obstructive pulmonary disease with (acute) exacerbation: Secondary | ICD-10-CM

## 2015-08-27 NOTE — Telephone Encounter (Signed)
lmtcb x1 for pt. 

## 2015-08-27 NOTE — Telephone Encounter (Signed)
Spoke with the pt  He is c/o increased SOB  He seems confused about what meds he is supposed to take  OV with TP at 4:15 today

## 2015-08-27 NOTE — Progress Notes (Signed)
Subjective:    Patient ID: Warren [name], male    DOB: [birth year], 60 y.o.   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 Old chart note, recent hospital notes and PFT's reviewed > FEV1 was 1.25L (42% predicted) in 01/2011. He was hospitalized in April for PNA and AE-COPD. Had a MBS on 5/9 that confirmed delayed clearance, risk for aspiration but no overt aspiration. He is now on Advair + Spiriva. He uses albuterol nebs every few days. He quit smoking after the hospitalization, is on Chantix. He is feeling better. Notes that he has had frequent PNA's, a few a year. No wheeze or cough at this time. His wife notes that he is slowly putting on some weight. 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 Last seen in May 2016. He has been managed on Spiriva and Advair, but he is not on these meds right now due to cost. He has been off for several months. He is taking albuterol nebs bid, they help him but he prefers the long acting meds. No hospitalizations since last time. Coughs every day, dry cough.   08/27/2015 Acute OV :  Pt presents for an acute office visit.  Complains of 1 week of increased cough, thick yellow "nasty " mucus , wheezing and sob.  Seen 6 weeks ago by PCP , tx w/ abx and steroids that helped until last week. CXR 07/2015 w/ copd changes.  Started on [facility] in March but did not feel it helped. Seen by PCP , started on Duoneb Four times a day .  Denies n/v/d, hemoptysis, chest pain, orthopnea, edema or fever.  Says O2 were low at PCP office in April with walking but returned to normal at rest.  Walk test in office with O2 sats 97% on RA walking.  Discussed starting pulmicort neb but he has high deductible insurance and can not afford.       Past Medical History  Diagnosis Date  . Allergic rhinitis   . COPD (chronic obstructive pulmonary disease) ([place])   . Testicular atrophy     Right  . Candidal esophagitis (H[facility])     Dr. Benson Norway 10/2012  . Osteoporosis 04/10/2009    L hip fracture; T score -4.2 in 2012.  Fosamax for one year.  . Dysarthria 02/16/2014  . 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 Abnormal MRI of head   . 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 Dysphagia, pharyngoesophageal phase 06/15/2014  . Raynaud disease 06/15/2014    Bilateral hands  . Pneumonia    Current Outpatient Prescriptions on File Prior to Visit  Medication Sig Dispense Refill  . albuterol (PROVENTIL) (2.5 MG/3ML) 0.083% nebulizer solution Take 3 mLs (2.5 mg total) by nebulization every 6 (six) hours as needed for wheezing or shortness of breath. 360 mL 12  . aspirin EC 81 MG tablet Take 1 tablet (81 mg total) by mouth daily. 30 tablet 0  . fluticasone (FLONASE) 50 MCG/ACT nasal spray Place into both nostrils daily as needed for allergies or rhinitis (for allergies).    Marland Kitchen ipratropium (ATROVENT) 0.02 % nebulizer solution Take 2.5 mLs (0.5 mg total) by nebulization 4 (four) times daily. 360 mL 12  . Multiple Vitamin (MULTIVITAMIN) tablet Take  1 tablet by mouth daily.      Marland Kitchen Spacer/Aero-Holding Chambers (E-Z SPACER) inhaler Use as instructed 1 each 2  . feeding supplement, ENSURE ENLIVE, (ENSURE ENLIVE) LIQD Take 237 mLs by mouth daily after supper. (Patient not taking: Reported on 08/27/2015) 237 mL 12  . tamsulosin (FLOMAX) 0.4 MG CAPS capsule TAKE ONE CAPSULE BY MOUTH EVERY DAY (Patient not taking: Reported on 08/27/2015) 30 capsule 0  . umeclidinium-vilanterol (ANORO ELLIPTA) 62.5-25 MCG/INH AEPB Inhale 1 puff into the lungs daily. (Patient not taking: Reported on 08/27/2015) 180 each 3  . varenicline (CHANTIX CONTINUING MONTH PAK) 1 MG tablet Take 1 tablet (1 mg total) by mouth 2 (two) times daily. (Patient not taking: Reported on 08/27/2015) 60 tablet 2    Current Facility-Administered Medications on File Prior to Visit  Medication Dose Route Frequency Provider Last Rate Last Dose  . albuterol (PROVENTIL) (2.5 MG/3ML) 0.083% nebulizer solution 2.5 mg  2.5 mg Nebulization Once Wardell Honour, MD         Review of Systems Constitutional:   No  weight loss, night sweats,  Fevers, chills,  +fatigue, or  lassitude.  HEENT:   No headaches,  Difficulty swallowing,  Tooth/dental problems, or  Sore throat,                No sneezing, itching, ear ache, nasal congestion, post nasal drip,   CV:  No chest pain,  Orthopnea, PND, swelling in lower extremities, anasarca, dizziness, palpitations, syncope.   GI  No heartburn, indigestion, abdominal pain, nausea, vomiting, diarrhea, change in bowel habits, loss of appetite, bloody stools.   Resp:    No chest wall deformity  Skin: no rash or lesions.  GU: no dysuria, change in color of urine, no urgency or frequency.  No flank pain, no hematuria   MS:  No joint pain or swelling.  No decreased range of motion.  No back pain.  Psych:  No change in mood or affect. No depression or anxiety.  No memory loss.         Objective:   Physical Exam  Filed Vitals:   08/27/15 1531  BP: 106/78  Pulse: 100  Temp: 97.8 F (36.6 C)  TempSrc: Oral  Height: 5\' 7"  (1.702 m)  Weight: 102 lb (46.267 kg)  SpO2: 95%     GEN: A/Ox3; pleasant , NAD, thin and frail   HEENT:  [place]/AT,  EACs-clear, TMs-wnl, NOSE-clear, THROAT-clear, no lesions, no postnasal drip or exudate noted.   NECK:  Supple w/ fair ROM; no JVD; normal carotid impulses w/o bruits; no thyromegaly or nodules palpated; no lymphadenopathy.  RESP  Decreased BS in bases , no accessory muscle use, no dullness to percussion  CARD:  RRR, no m/r/g  , no peripheral edema, pulses intact, no cyanosis or clubbing.  GI:   Soft & nt; nml bowel sounds; no organomegaly or masses detected.  Musco: Warm bil, no deformities or joint swelling noted.    Neuro: alert, no focal deficits noted.    Skin: Warm, no lesions or rashes  Tammy Parrett NP-C  [facility]  08/27/2015.        Assessment & Plan:

## 2015-08-27 NOTE — Patient Instructions (Addendum)
Doxycycline 100mg  Twice daily  For 7 days  Mucinex DM Twice daily  As needed  Cough/congestion  Prednisone taper over next week.  Please contact office for sooner follow up if symptoms do not improve or worsen or seek emergency care  Follow up with Dr. Lamonte Sakai  In 1 month as planned and As needed

## 2015-08-27 NOTE — Assessment & Plan Note (Signed)
Recurrent exacerbation  No desats with walking   Plan  Doxycycline 100mg  Twice daily  For 7 days  Mucinex DM Twice daily  As needed  Cough/congestion  Prednisone taper over next week.  Please contact office for sooner follow up if symptoms do not improve or worsen or seek emergency care  Follow up with Dr. Lamonte Sakai  In 1 month as planned and As needed

## 2015-08-28 ENCOUNTER — Telehealth: Payer: Self-pay | Admitting: Internal Medicine

## 2015-08-28 MED ORDER — PREDNISONE 10 MG PO TABS: ORAL_TABLET | ORAL | Status: DC

## 2015-08-28 MED ORDER — DOXYCYCLINE MONOHYDRATE 100 MG PO CAPS: 100.0000 mg | ORAL_CAPSULE | Freq: Two times a day (BID) | ORAL | Status: DC

## 2015-08-28 NOTE — Telephone Encounter (Signed)
Wife called. Pt saw TP yest. Doxy and pred taper didn't get sent to drug store. P- Meds were called to [facility]

## 2015-09-28 ENCOUNTER — Ambulatory Visit (INDEPENDENT_AMBULATORY_CARE_PROVIDER_SITE_OTHER): Payer: BLUE CROSS/BLUE SHIELD | Admitting: Emergency Medicine

## 2015-09-28 ENCOUNTER — Other Ambulatory Visit: Payer: Self-pay | Admitting: Emergency Medicine

## 2015-09-28 ENCOUNTER — Encounter: Payer: Self-pay | Admitting: Emergency Medicine

## 2015-09-28 VITALS — BP 102/74 | HR 96 | Ht 66.0 in | Wt 102.0 lb

## 2015-09-28 DIAGNOSIS — J441 Chronic obstructive pulmonary disease with (acute) exacerbation: Secondary | ICD-10-CM

## 2015-09-28 MED ORDER — PREDNISONE 20 MG PO TABS: 20.0000 mg | ORAL_TABLET | Freq: Every day | ORAL | Status: DC

## 2015-09-28 NOTE — Assessment & Plan Note (Signed)
He is not actively wheezing. He appears to be at baseline but this baseline is with significant respiratory limitation. I discussed changing medications with him. I think for now would like to keep him on the albuterol and ipratropium nebulized regimen, and prednisone 20 mg daily. We will stay on the prednisone until her next visit and then at that time we will start to taper and get to the lowest effective dose. Once he is stable on prednisone we can consider trying to change the nebulizers back to long-acting bronchodilators. Walking oximetry today on room air to assess for occult hypoxemia and an indication for exertional oxygen.

## 2015-09-28 NOTE — Progress Notes (Signed)
Subjective:    Patient ID: Warren [name], male    DOB: July 20, 1955, 60 y.o.   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 Old chart note, recent hospital notes and PFT's reviewed > FEV1 was 1.25L (42% predicted) in 01/2011. He was hospitalized in April for PNA and AE-COPD. Had a MBS on 5/9 that confirmed delayed clearance, risk for aspiration but no overt aspiration. He is now on Advair + Spiriva. He uses albuterol nebs every few days. He quit smoking after the hospitalization, is on Chantix. He is feeling better. Notes that he has had frequent PNA's, a few a year. No wheeze or cough at this time. His wife notes that he is slowly putting on some weight. 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 Last seen in May 2016. He has been managed on Spiriva and Advair, but he is not on these meds right now due to cost. He has been off for several months. He is taking albuterol nebs bid, they help him but he prefers the long acting meds. No hospitalizations since last time. Coughs every day, dry cough.   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 He suffered a flare of his COPD in May for which he was treated with doxycycline and prednisone. He did improve transiently, but now states that he is weak, unable to walk any significant distance. Dyspnea at rest and w exertion. Has a chronic persistent cough, prod of clear mucous. We had tried Anoro, but he is unsure that it helped. He is using albuterol / atrovent nebs qid.      Review of Systems  Constitutional: Negative for fever and unexpected weight change.  HENT: Positive for dental problem and trouble swallowing. Negative for  congestion, ear pain, nosebleeds, postnasal drip, rhinorrhea, sinus pressure, sneezing and sore throat.   Eyes: Negative for redness and itching.  Respiratory: Positive for cough and shortness of breath. Negative for chest tightness and wheezing.   Cardiovascular: Negative for palpitations and leg swelling.  Gastrointestinal: Negative for nausea and vomiting.  Genitourinary: Negative for dysuria.  Musculoskeletal: Negative for joint swelling.  Skin: Negative for rash.  Neurological: Negative for headaches.  Hematological: Does not bruise/bleed easily.  Psychiatric/Behavioral: Negative for dysphoric mood. The patient is not nervous/anxious.          Objective:   Physical Exam Filed Vitals:   09/28/15 1355 09/28/15 1357  BP:  102/74  Pulse:  96  Height: 5\' 6"  (1.676 m)   Weight: 102 lb (46.267 kg)   SpO2:  98%   Gen: Pleasant, very thin, in no distress,  normal affect  ENT: No lesions,  mouth clear,  oropharynx clear, no postnasal drip  Neck: No JVD, no TMG, no carotid bruits  Lungs: No use of accessory muscles, clear without rales or rhonchi  Cardiovascular: RRR, heart sounds normal, no murmur or gallops, no peripheral edema  Musculoskeletal: No deformities, no cyanosis or clubbing  Neuro: alert, non focal  Skin: Warm, no lesions or rashes     Assessment & Plan:  COPD (chronic obstructive pulmonary disease) He is not actively wheezing. He appears to be at baseline but this baseline is  with significant respiratory limitation. I discussed changing medications with him. I think for now would like to keep him on the albuterol and ipratropium nebulized regimen, and prednisone 20 mg daily. We will stay on the prednisone until her next visit and then at that time we will start to taper and get to the lowest effective dose. Once he is stable on prednisone we can consider trying to change the nebulizers back to long-acting bronchodilators. Walking oximetry today on room air to  assess for occult hypoxemia and an indication for exertional oxygen.   Baltazar Apo, MD, PhD 09/28/2015, 2:28 PM [facility] [phone] or if no answer [phone]

## 2015-09-28 NOTE — Patient Instructions (Addendum)
Please start prednisone 20mg  daily until our next visit Continue your albuterol / atrovent nebulizers 4 times a day Walking oximetry today on room air Follow with Dr Lamonte Sakai in 4-6 weeks

## 2015-09-28 NOTE — Addendum Note (Signed)
Addended by: Desmond Dike C on: 09/28/2015 02:36 PM   Modules accepted: SmartSet

## 2015-11-16 ENCOUNTER — Ambulatory Visit (INDEPENDENT_AMBULATORY_CARE_PROVIDER_SITE_OTHER): Payer: BLUE CROSS/BLUE SHIELD | Admitting: Emergency Medicine

## 2015-11-16 ENCOUNTER — Encounter: Payer: Self-pay | Admitting: Emergency Medicine

## 2015-11-16 VITALS — BP 98/62 | HR 92 | Ht 66.0 in | Wt 103.0 lb

## 2015-11-16 DIAGNOSIS — J449 Chronic obstructive pulmonary disease, unspecified: Secondary | ICD-10-CM | POA: Diagnosis not present

## 2015-11-16 MED ORDER — PREDNISONE 10 MG PO TABS: ORAL_TABLET | ORAL | 0 refills | Status: DC

## 2015-11-16 MED ORDER — PREDNISONE 10 MG PO TABS: ORAL_TABLET | ORAL | 2 refills | Status: DC

## 2015-11-16 NOTE — Progress Notes (Signed)
Subjective:    Patient ID: Warren [name], male    DOB: 1955-05-30, 60 y.o.   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 Old chart note, recent hospital notes and PFT's reviewed > FEV1 was 1.25L (42% predicted) in 01/2011. He was hospitalized in April for PNA and AE-COPD. Had a MBS on 5/9 that confirmed delayed clearance, risk for aspiration but no overt aspiration. He is now on Advair + Spiriva. He uses albuterol nebs every few days. He quit smoking after the hospitalization, is on Chantix. He is feeling better. Notes that he has had frequent PNA's, a few a year. No wheeze or cough at this time. His wife notes that he is slowly putting on some weight. 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 Last seen in May 2016. He has been managed on Spiriva and Advair, but he is not on these meds right now due to cost. He has been off for several months. He is taking albuterol nebs bid, they help him but he prefers the long acting meds. No hospitalizations since last time. Coughs every day, dry cough.   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 He suffered a flare of his COPD in May for which he was treated with doxycycline and prednisone. He did improve transiently, but now states that he is weak, unable to walk any significant distance. Dyspnea at rest and w exertion. Has a chronic persistent cough, prod of clear mucous. We had tried Anoro, but he is unsure that it helped. He is using albuterol / atrovent nebs qid.   ROV 11/16/15 --  Patient follows up for his history of severe COPD, chronic dysphagia, allergic rhinitis. At our last visit we continued him on nebulized medications. He has  been on prednisone 20 mg daily since our last visit. His feels better, believes that his anxiety is better, probably because his dyspnea is better. He is more able to do activities now.  Walking oximetry last time reassuring. He is taking duoneb 3-4x a day. Not smoking.    Review of Systems  Constitutional: Negative for fever and unexpected weight change.  HENT: Positive for dental problem and trouble swallowing. Negative for congestion, ear pain, nosebleeds, postnasal drip, rhinorrhea, sinus pressure, sneezing and sore throat.   Eyes: Negative for redness and itching.  Respiratory: Positive for cough and shortness of breath. Negative for chest tightness and wheezing.   Cardiovascular: Negative for palpitations and leg swelling.  Gastrointestinal: Negative for nausea and vomiting.  Genitourinary: Negative for dysuria.  Musculoskeletal: Negative for joint swelling.  Skin: Negative for rash.  Neurological: Negative for headaches.  Hematological: Does not bruise/bleed easily.  Psychiatric/Behavioral: Negative for dysphoric mood. The patient is not nervous/anxious.          Objective:   Physical Exam Vitals:   11/16/15 1438  BP: 98/62  Pulse: 92  SpO2: 95%  Weight: 103 lb (46.7 kg)  Height: 5\' 6"  (1.676 m)   Gen: Pleasant, very thin, in no distress,  normal affect  ENT: No lesions,  mouth clear,  oropharynx clear, no postnasal drip  Neck: No JVD, no TMG, no carotid bruits  Lungs: No use  of accessory muscles, clear without rales or rhonchi  Cardiovascular: RRR, heart sounds normal, no murmur or gallops, no peripheral edema  Musculoskeletal: No deformities, no cyanosis or clubbing  Neuro: alert, non focal  Skin: Warm, no lesions or rashes     Assessment & Plan:  COPD (chronic obstructive pulmonary disease) Please decrease her prednisone to 15 mg daily for 1 week, then if doing well decrease again to 10 mg daily. Stay on 10 mg a day until our next visit. Please continue  your nebulized medications as you have been taking them Follow with Dr Lamonte Sakai in 2 months or sooner if you have any problems.  Baltazar Apo, MD, PhD 11/16/2015, 3:22 PM [facility] [phone] or if no answer [phone]

## 2015-11-16 NOTE — Patient Instructions (Addendum)
Please decrease her prednisone to 15 mg daily for 1 week, then if doing well decrease again to 10 mg daily. Stay on 10 mg a day until our next visit. Please continue your nebulized medications as you have been taking them Follow with Dr Lamonte Sakai in 2 months or sooner if you have any problems.

## 2015-11-16 NOTE — Assessment & Plan Note (Addendum)
Please decrease her prednisone to 15 mg daily for 1 week, then if doing well decrease again to 10 mg daily. Stay on 10 mg a day until our next visit. Please continue your nebulized medications as you have been taking them Follow with Dr Lamonte Sakai in 2 months or sooner if you have any problems.

## 2016-01-20 ENCOUNTER — Encounter: Payer: Self-pay | Admitting: Emergency Medicine

## 2016-01-20 ENCOUNTER — Ambulatory Visit (INDEPENDENT_AMBULATORY_CARE_PROVIDER_SITE_OTHER): Payer: BLUE CROSS/BLUE SHIELD | Admitting: Emergency Medicine

## 2016-01-20 DIAGNOSIS — J441 Chronic obstructive pulmonary disease with (acute) exacerbation: Secondary | ICD-10-CM

## 2016-01-20 DIAGNOSIS — Z23 Encounter for immunization: Secondary | ICD-10-CM

## 2016-01-20 MED ORDER — PREDNISONE 10 MG PO TABS: ORAL_TABLET | ORAL | 0 refills | Status: DC

## 2016-01-20 NOTE — Assessment & Plan Note (Signed)
Please continue your prednisone 15mg  daily for now. If your breathing has not improved in the next several days then please call our office so we can temporarily increase your prednisone and then taper back down to 15mg .  Please continue your nebulizers four times a day as you are taking them Start taking your flonase 2 sprays each nostril every day Flu shot today.  Follow with Dr Lamonte Sakai in 2 months or sooner if you have any problems.

## 2016-01-20 NOTE — Patient Instructions (Signed)
Please continue your prednisone 15mg  daily for now. If your breathing has not improved in the next several days then please call our office so we can temporarily increase your prednisone and then taper back down to 15mg .  Please continue your nebulizers four times a day as you are taking them Start taking your flonase 2 sprays each nostril every day Flu shot today.  Follow with Dr Lamonte Sakai in 2 months or sooner if you have any problems.

## 2016-01-20 NOTE — Progress Notes (Signed)
Subjective:    Patient ID: Warren [name], male    DOB: 1956-03-28, 60 y.o.   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 Old chart note, recent hospital notes and PFT's reviewed > FEV1 was 1.25L (42% predicted) in 01/2011. He was hospitalized in April for PNA and AE-COPD. Had a MBS on 5/9 that confirmed delayed clearance, risk for aspiration but no overt aspiration. He is now on Advair + Spiriva. He uses albuterol nebs every few days. He quit smoking after the hospitalization, is on Chantix. He is feeling better. Notes that he has had frequent PNA's, a few a year. No wheeze or cough at this time. His wife notes that he is slowly putting on some weight. 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 Last seen in May 2016. He has been managed on Spiriva and Advair, but he is not on these meds right now due to cost. He has been off for several months. He is taking albuterol nebs bid, they help him but he prefers the long acting meds. No hospitalizations since last time. Coughs every day, dry cough.   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 He suffered a flare of his COPD in May for which he was treated with doxycycline and prednisone. He did improve transiently, but now states that he is weak, unable to walk any significant distance. Dyspnea at rest and w exertion. Has a chronic persistent cough, prod of clear mucous. We had tried Anoro, but he is unsure that it helped. He is using albuterol / atrovent nebs qid.   ROV 11/16/15 --  Patient follows up for his history of severe COPD, chronic dysphagia, allergic rhinitis. At our last visit we continued him on nebulized medications. He has  been on prednisone 20 mg daily since our last visit. His feels better, believes that his anxiety is better, probably because his dyspnea is better. He is more able to do activities now.  Walking oximetry last time reassuring. He is taking duoneb 3-4x a day. Not smoking.   ROV 01/20/16 -- patient has a history of severe COPD, allergic rhinitis, dysphagia. At his last visit we attempted to taper his prednisone down to 10 mg daily. He needed to go back up to 15mg  because her experienced . He had an episode of sudden dyspnea and associated anxiety 3 days ago. States tha the has been jittery every since. Has been coughing more last 3 days, clear mucous. He is using flonase prn.     Review of Systems  Constitutional: Negative for fever and unexpected weight change.  HENT: Positive for dental problem and trouble swallowing. Negative for congestion, ear pain, nosebleeds, postnasal drip, rhinorrhea, sinus pressure, sneezing and sore throat.   Eyes: Negative for redness and itching.  Respiratory: Positive for cough and shortness of breath. Negative for chest tightness and wheezing.   Cardiovascular: Negative for palpitations and leg swelling.  Gastrointestinal: Negative for nausea and vomiting.  Genitourinary: Negative for dysuria.  Musculoskeletal: Negative for joint swelling.  Skin: Negative for rash.  Neurological: Negative for headaches.  Hematological: Does not bruise/bleed easily.  Psychiatric/Behavioral: Negative for dysphoric mood. The patient is not nervous/anxious.  Objective:   Physical Exam Vitals:   01/20/16 1412  BP: 110/70  Pulse: (!) 109  SpO2: 97%  Weight: 102 lb 6.4 oz (46.4 kg)  Height: 5\' 6"  (1.676 m)   Gen: Pleasant, very thin, in no distress,  normal affect  ENT: No lesions,  mouth clear,  oropharynx clear, no postnasal drip  Neck: No JVD, no TMG, no carotid bruits  Lungs: No use of accessory muscles, clear without rales or rhonchi  Cardiovascular: RRR,  heart sounds normal, no murmur or gallops, no peripheral edema  Musculoskeletal: No deformities, no cyanosis or clubbing  Neuro: alert, non focal  Skin: Warm, no lesions or rashes     Assessment & Plan:  COPD (chronic obstructive pulmonary disease) Please continue your prednisone 15mg  daily for now. If your breathing has not improved in the next several days then please call our office so we can temporarily increase your prednisone and then taper back down to 15mg .  Please continue your nebulizers four times a day as you are taking them Start taking your flonase 2 sprays each nostril every day Flu shot today.  Follow with Dr Lamonte Sakai in 2 months or sooner if you have any problems.   Baltazar Apo, MD, PhD 01/20/2016, 2:31 PM [facility] [phone] or if no answer [phone]

## 2016-01-22 ENCOUNTER — Telehealth: Payer: Self-pay | Admitting: Internal Medicine

## 2016-01-22 MED ORDER — PREDNISONE 10 MG PO TABS: ORAL_TABLET | ORAL | 0 refills | Status: DC

## 2016-01-22 NOTE — Telephone Encounter (Signed)
Pt requesting do double pred as per Dr Agustina Caroli last instructions but does not have enough  rec pred 30 mg per day unitl improves then work back to 15 mg per day as per instructions from last ov.  Called in 10 mg x 100 no refills

## 2016-01-24 MED ORDER — PREDNISONE 10 MG PO TABS: ORAL_TABLET | ORAL | 0 refills | Status: DC

## 2016-01-24 NOTE — Telephone Encounter (Signed)
Pt states he is still not feeling any better.  [phone]

## 2016-01-24 NOTE — Telephone Encounter (Signed)
Please have him take pred as follows > Take 40mg  daily for 3 days, then 30mg  daily for 3 days, then 20mg  daily indefinitely until we follow up

## 2016-01-24 NOTE — Telephone Encounter (Signed)
Patient states that he is shaking a lot, nervous all the time, no energy, cannot move at all.  Runs out of breath if he moves around.  No fever.  Chest congestion, no chest tightness. Coughing up clear/yellow mucus.  Mostly clear.  Patient states that Dr. Lamonte Sakai advised him to talk to him before he starts on the higher dose of prednisone.  Patient is calling to get instructions from Dr. Lamonte Sakai.  Patient aware that Dr. Lamonte Sakai is not in the office until after 1:30pm today.  Ok with waiting until he gets in the office today.    Dr. Lamonte Sakai, please advise.

## 2016-01-24 NOTE — Telephone Encounter (Signed)
Called spoke with pt. Reviewed RB's recs and verified pharmacy as [facility] on [address]. Pt voiced understanding and had no further questions. Rx sent. Nothing further needed.

## 2016-03-23 ENCOUNTER — Ambulatory Visit (INDEPENDENT_AMBULATORY_CARE_PROVIDER_SITE_OTHER): Payer: BLUE CROSS/BLUE SHIELD | Admitting: Emergency Medicine

## 2016-03-23 ENCOUNTER — Encounter: Payer: Self-pay | Admitting: Emergency Medicine

## 2016-03-23 DIAGNOSIS — Z23 Encounter for immunization: Secondary | ICD-10-CM

## 2016-03-23 DIAGNOSIS — J441 Chronic obstructive pulmonary disease with (acute) exacerbation: Secondary | ICD-10-CM | POA: Diagnosis not present

## 2016-03-23 MED ORDER — ALBUTEROL SULFATE HFA 108 (90 BASE) MCG/ACT IN AERS: 2.0000 | INHALATION_SPRAY | RESPIRATORY_TRACT | 2 refills | Status: AC | PRN

## 2016-03-23 NOTE — Progress Notes (Signed)
Subjective:    Patient ID: Warren [name], male    DOB: 05-21-1955, 60 y.o.   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 Old chart note, recent hospital notes and PFT's reviewed > FEV1 was 1.25L (42% predicted) in 01/2011. He was hospitalized in April for PNA and AE-COPD. Had a MBS on 5/9 that confirmed delayed clearance, risk for aspiration but no overt aspiration. He is now on Advair + Spiriva. He uses albuterol nebs every few days. He quit smoking after the hospitalization, is on Chantix. He is feeling better. Notes that he has had frequent PNA's, a few a year. No wheeze or cough at this time. His wife notes that he is slowly putting on some weight. 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 Last seen in May 2016. He has been managed on Spiriva and Advair, but he is not on these meds right now due to cost. He has been off for several months. He is taking albuterol nebs bid, they help him but he prefers the long acting meds. No hospitalizations since last time. Coughs every day, dry cough.   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 He suffered a flare of his COPD in May for which he was treated with doxycycline and prednisone. He did improve transiently, but now states that he is weak, unable to walk any significant distance. Dyspnea at rest and w exertion. Has a chronic persistent cough, prod of clear mucous. We had tried Anoro, but he is unsure that it helped. He is using albuterol / atrovent nebs qid.   ROV 11/16/15 --  Patient follows up for his history of severe COPD, chronic dysphagia, allergic rhinitis. At our last visit we continued him on nebulized medications. He has  been on prednisone 20 mg daily since our last visit. His feels better, believes that his anxiety is better, probably because his dyspnea is better. He is more able to do activities now.  Walking oximetry last time reassuring. He is taking duoneb 3-4x a day. Not smoking.   ROV 01/20/16 -- patient has a history of severe COPD, allergic rhinitis, dysphagia. At his last visit we attempted to taper his prednisone down to 10 mg daily. He needed to go back up to 15mg  because her experienced . He had an episode of sudden dyspnea and associated anxiety 3 days ago. States tha the has been jittery every since. Has been coughing more last 3 days, clear mucous. He is using flonase prn.   ROV 03/23/16 --  severe COPD, allergic rhinitis and chronic prednisone use. I treated him for an acute flare in October with a steroid taper. His current dose of prednisone is 20mg   He is on duoneb qid. He still has exertional SOB, episodic periods of severe dyspnea with chores, exertion. He is on flonase. Flu shot up to date.   Review of Systems  Constitutional: Negative for fever and unexpected weight change.  HENT: Positive for dental problem and trouble swallowing. Negative for congestion, ear pain, nosebleeds, postnasal drip, rhinorrhea, sinus pressure, sneezing and sore throat.   Eyes: Negative for redness and itching.  Respiratory: Positive for cough and shortness of breath.  Negative for chest tightness and wheezing.   Cardiovascular: Negative for palpitations and leg swelling.  Gastrointestinal: Negative for nausea and vomiting.  Genitourinary: Negative for dysuria.  Musculoskeletal: Negative for joint swelling.  Skin: Negative for rash.  Neurological: Negative for headaches.  Hematological: Does not bruise/bleed easily.  Psychiatric/Behavioral: Negative for dysphoric mood. The patient is not nervous/anxious.          Objective:   Physical Exam Vitals:   03/23/16 1421  BP: 110/64  Pulse: 100  SpO2: 98%    Weight: 104 lb 3.2 oz (47.3 kg)  Height: 5\' 6"  (1.676 m)   Gen: Pleasant, very thin, in no distress,  normal affect  ENT: No lesions,  mouth clear,  oropharynx clear, no postnasal drip  Neck: No JVD, no TMG, no carotid bruits  Lungs: No use of accessory muscles, clear without rales or rhonchi  Cardiovascular: RRR, heart sounds normal, no murmur or gallops, no peripheral edema  Musculoskeletal: No deformities, no cyanosis or clubbing  Neuro: alert, non focal  Skin: Warm, no lesions or rashes     Assessment & Plan:  COPD (chronic obstructive pulmonary disease) Please continue your nebulizers four times a day.  Continue your prednisone at 20mg  daily.  Continue your flonase spray as you are taking it.  Prevnar 13 shot today.  Use albuterol 2 puffs up to every 4 hours if needed for shortness of breath.  Follow with Dr Lamonte Sakai in 4 months or sooner if you have any problems.  Baltazar Apo, MD, PhD 03/23/2016, 2:34 PM [facility] [phone] or if no answer [phone]

## 2016-03-23 NOTE — Patient Instructions (Addendum)
Please continue your nebulizers four times a day.  Continue your prednisone at 20mg  daily.  Continue your flonase spray as you are taking it.  Prevnar 13 shot today.  Use albuterol 2 puffs up to every 4 hours if needed for shortness of breath.  Follow with Dr Lamonte Sakai in 4 months or sooner if you have any problems.

## 2016-03-23 NOTE — Assessment & Plan Note (Signed)
Please continue your nebulizers four times a day.  Continue your prednisone at 20mg  daily.  Continue your flonase spray as you are taking it.  Prevnar 13 shot today.  Use albuterol 2 puffs up to every 4 hours if needed for shortness of breath.  Follow with Dr Lamonte Sakai in 4 months or sooner if you have any problems.

## 2016-03-23 NOTE — Addendum Note (Signed)
Addended by: Benson Setting L on: 03/23/2016 03:23 PM   Modules accepted: Orders

## 2016-03-23 NOTE — Addendum Note (Signed)
Addended by: Benson Setting L on: 03/23/2016 02:48 PM   Modules accepted: Orders

## 2016-04-17 ENCOUNTER — Telehealth: Payer: Self-pay | Admitting: Emergency Medicine

## 2016-04-17 ENCOUNTER — Other Ambulatory Visit: Payer: Self-pay | Admitting: Emergency Medicine

## 2016-04-17 MED ORDER — PREDNISONE 20 MG PO TABS: 20.0000 mg | ORAL_TABLET | Freq: Every day | ORAL | 3 refills | Status: DC

## 2016-04-17 NOTE — Telephone Encounter (Signed)
Rx sent to preferred pharmacy. Pt aware and voiced understanding. Nothing further needed.  

## 2016-06-13 ENCOUNTER — Ambulatory Visit (INDEPENDENT_AMBULATORY_CARE_PROVIDER_SITE_OTHER): Payer: BLUE CROSS/BLUE SHIELD | Admitting: Pulmonary Disease

## 2016-06-13 ENCOUNTER — Ambulatory Visit (INDEPENDENT_AMBULATORY_CARE_PROVIDER_SITE_OTHER)
Admission: RE | Admit: 2016-06-13 | Discharge: 2016-06-13 | Disposition: A | Discharge status: Home / Self Care | Payer: BLUE CROSS/BLUE SHIELD | Source: Ambulatory Visit | Attending: Pulmonary Disease | Admitting: Pulmonary Disease

## 2016-06-13 ENCOUNTER — Encounter: Payer: Self-pay | Admitting: Pulmonary Disease

## 2016-06-13 VITALS — BP 102/70 | HR 117 | Temp 97.5°F | Ht 66.0 in | Wt 99.6 lb

## 2016-06-13 DIAGNOSIS — R0600 Dyspnea, unspecified: Secondary | ICD-10-CM

## 2016-06-13 MED ORDER — PREDNISONE 10 MG PO TABS: ORAL_TABLET | ORAL | 0 refills | Status: DC

## 2016-06-13 MED ORDER — BUDESONIDE-FORMOTEROL FUMARATE 160-4.5 MCG/ACT IN AERO: 2.0000 | INHALATION_SPRAY | Freq: Two times a day (BID) | RESPIRATORY_TRACT | 6 refills | Status: DC

## 2016-06-13 MED ORDER — BUDESONIDE-FORMOTEROL FUMARATE 160-4.5 MCG/ACT IN AERO: 2.0000 | INHALATION_SPRAY | Freq: Two times a day (BID) | RESPIRATORY_TRACT | 0 refills | Status: DC

## 2016-06-13 MED ORDER — FLUTICASONE-SALMETEROL 113-14 MCG/ACT IN AEPB: 1.0000 | INHALATION_SPRAY | Freq: Two times a day (BID) | RESPIRATORY_TRACT | 5 refills | Status: DC

## 2016-06-13 MED ORDER — LEVALBUTEROL HCL 0.63 MG/3ML IN NEBU
0.6300 mg | INHALATION_SOLUTION | Freq: Once | RESPIRATORY_TRACT | Status: AC
  Administered 2016-06-13: 0.63 mg via RESPIRATORY_TRACT

## 2016-06-13 MED ORDER — DOXYCYCLINE HYCLATE 100 MG PO TABS: 100.0000 mg | ORAL_TABLET | Freq: Two times a day (BID) | ORAL | 0 refills | Status: DC

## 2016-06-13 NOTE — Assessment & Plan Note (Signed)
Unfortunately Jamyron is having another exacerbation of COPD. He appears to have chronic COPD based on his body habitus, severe symptoms, and emphysema seen on chest x-ray.  Plan: Check a chest x-ray to ensure there is no evidence of pneumonia Prednisone taper then resume 20mg  daily  Doxycycline twice a day Mucinex twice a day Use albuterol around-the-clock Start air duo 1 puff twice a day, prescription sent, sample given today for Symbicort to use for the next few weeks Follow-up with Dr. Lamonte Sakai as previously arranged in early April, however if symptoms worsen I advised him to go the emergency room

## 2016-06-13 NOTE — Patient Instructions (Signed)
Take the prednisone taper as prescribed Take the doxycycline as prescribed Use albuterol 4 times a day for the next several days Use Mucinex twice a day for the next 3-4 days If your symptoms worsen, you need to go to the emergency room for further treatment Follow-up with Dr. Lamonte Sakai as previously arranged

## 2016-06-13 NOTE — Progress Notes (Signed)
Subjective:    Patient ID: Warren [name], male    DOB: April 09, 1956, 61 y.o.   MRN: [id number]  Synopsis: Patient of Dr. Lamonte Sakai who has cerebellar ataxia and COPD. Dr. Agustina Caroli notes from December reviewed were he was cared for for his COPD with chronic prednisone and Advair.  HPI  Chief Complaint  Patient presents with  . Acute Visit    unable to walk, SOB, cough with mucus, past 2 days he has been getting worse, back pain, wheezing   Ethin is having a hard time breathing.   > 2 days of symptoms > started after having some back pain a few weeks ago  He has a cough with mucus production > the mucus is white to clear  He has chills, no fever.  He has back pain and rib cage pain which is worse with a cough.  Some positions are worse than others when coughing.    He has been taking ibuprofen.  He is still taking prednisone.  Not taking any controller medications.    He is still taking ipratropium and albuterol.     Past Medical History:  Diagnosis Date  . Abnormal MRI of head   . Allergic rhinitis   . Candidal esophagitis (HCC)    Dr. Benson Norway 10/2012  . COPD (chronic obstructive pulmonary disease) ([place])   . Dysarthria 02/16/2014  . Dysphagia, pharyngoesophageal phase 06/15/2014  . 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 Osteoporosis 04/10/2009   L hip fracture; T score -4.2 in 2012.  Fosamax for one year.  . Pneumonia   . Raynaud disease 06/15/2014   Bilateral hands  . 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 Testicular atrophy    Right     Review of Systems  Constitutional: Negative for diaphoresis, fatigue and fever.  HENT: Negative for rhinorrhea, sinus pain and sinus pressure.   Respiratory: Positive for cough, shortness of breath and wheezing.   Cardiovascular: Negative for chest pain, palpitations and leg swelling.       Objective:   Physical Exam Vitals:   06/13/16 1449  BP: 102/70  Pulse: (!) 117  Temp: 97.5 F (36.4 C)  TempSrc: Oral  SpO2: 93%  Weight: 99 lb  9.6 oz (45.2 kg)  Height: 5\' 6"  (1.676 m)   RA  Gen: chronically ill appearing, mild respiratory distress HENT: OP clear, TM's clear, neck supple PULM: Poor air movement, barrel shaped chest, scant wheezing, normal percussion CV: RRR, no mgr, trace edema GI: BS+, soft, nontender Derm: no cyanosis or rash Psyche: normal mood and affect   2011 chest x-ray images independently reviewed showing emphysema bilaterally with flattening of the diaphragms and a normal cardiac silhouette       Assessment & Plan:  COPD exacerbation ([place]) Unfortunately Lequan is having another exacerbation of COPD. He appears to have chronic COPD based on his body habitus, severe symptoms, and emphysema seen on chest x-ray.  Plan: Check a chest x-ray to ensure there is no evidence of pneumonia Prednisone taper then resume 20mg  daily  Doxycycline twice a day Mucinex twice a day Use albuterol around-the-clock Start air duo 1 puff twice a day, prescription sent, sample given today for Symbicort to use for the next few weeks Follow-up with Dr. Lamonte Sakai as previously arranged in early April, however if symptoms worsen I advised him to go the emergency room    Current Outpatient Prescriptions:  .  albuterol (PROVENTIL HFA;VENTOLIN HFA) 108 (90 Base) MCG/ACT inhaler, Inhale 2 puffs into the  lungs as needed for wheezing or shortness of breath., Disp: 1 Inhaler, Rfl: 2 .  albuterol (PROVENTIL) (2.5 MG/3ML) 0.083% nebulizer solution, Take 3 mLs (2.5 mg total) by nebulization every 6 (six) hours as needed for wheezing or shortness of breath. (Patient not taking: Reported on 06/13/2016), Disp: 360 mL, Rfl: 12 .  fluticasone (FLONASE) 50 MCG/ACT nasal spray, Place into both nostrils daily as needed for allergies or rhinitis (for allergies)., Disp: , Rfl:  .  Fluticasone-Salmeterol (AIRDUO RESPICLICK [id number]) 0000000 MCG/ACT AEPB, Inhale 1 puff into the lungs 2 (two) times daily., Disp: 1 each, Rfl: 5 .  ipratropium (ATROVENT)  0.02 % nebulizer solution, Take 2.5 mLs (0.5 mg total) by nebulization 4 (four) times daily., Disp: 360 mL, Rfl: 12 .  predniSONE (DELTASONE) 10 MG tablet, Take 40mg  po daily for 3 days, then take 30mg  po daily for 3 days, then take 20mg  po daily for two days, then take 10mg  po daily for 2 days, Disp: 27 tablet, Rfl: 0 .  predniSONE (DELTASONE) 20 MG tablet, Take 1 tablet (20 mg total) by mouth daily with breakfast., Disp: 30 tablet, Rfl: 3 .  Spacer/Aero-Holding Chambers (E-Z SPACER) inhaler, Use as instructed (Patient not taking: Reported on 03/23/2016), Disp: 1 each, Rfl: 2  Current Facility-Administered Medications:  .  albuterol (PROVENTIL) (2.5 MG/3ML) 0.083% nebulizer solution 2.5 mg, 2.5 mg, Nebulization, Once, Wardell Honour, MD

## 2016-06-19 ENCOUNTER — Telehealth: Payer: Self-pay | Admitting: Emergency Medicine

## 2016-06-19 MED ORDER — DOXYCYCLINE HYCLATE 100 MG PO TABS: 100.0000 mg | ORAL_TABLET | Freq: Two times a day (BID) | ORAL | 0 refills | Status: DC

## 2016-06-19 NOTE — Telephone Encounter (Signed)
OK to extend the doxycycline out another 5 days. 100mg  bid.

## 2016-06-19 NOTE — Telephone Encounter (Signed)
Spoke with pt's wife, Enid Derry. States that pt is 75% better since seeing BQ on 06/13/16. Reports SOB, cough and wheezing. Cough is producing brown mucus. Denies chest tightness or fever. BQ gave him Doxy 100mg  1 po BID. Pt's wife is requesting a refill on this or another antibiotic RB sees fit. Pt is currently taking Prednisone 20mg  daily.  RB - please advise. Thanks.

## 2016-06-22 ENCOUNTER — Inpatient Hospital Stay (HOSPITAL_COMMUNITY)
Admission: EM | Admit: 2016-06-22 | Discharge: 2016-06-26 | DRG: 177 | Disposition: A | Discharge status: Home / Self Care | Payer: BLUE CROSS/BLUE SHIELD | Attending: Family Medicine | Admitting: Family Medicine

## 2016-06-22 ENCOUNTER — Encounter (HOSPITAL_COMMUNITY): Payer: Self-pay | Admitting: Emergency Medicine

## 2016-06-22 ENCOUNTER — Emergency Department (HOSPITAL_COMMUNITY): Payer: BLUE CROSS/BLUE SHIELD

## 2016-06-22 DIAGNOSIS — Z91041 Radiographic dye allergy status: Secondary | ICD-10-CM

## 2016-06-22 DIAGNOSIS — E43 Unspecified severe protein-calorie malnutrition: Secondary | ICD-10-CM | POA: Insufficient documentation

## 2016-06-22 DIAGNOSIS — Z7952 Long term (current) use of systemic steroids: Secondary | ICD-10-CM | POA: Diagnosis not present

## 2016-06-22 DIAGNOSIS — R471 Dysarthria and anarthria: Secondary | ICD-10-CM | POA: Diagnosis present

## 2016-06-22 DIAGNOSIS — J440 Chronic obstructive pulmonary disease with acute lower respiratory infection: Secondary | ICD-10-CM | POA: Diagnosis present

## 2016-06-22 DIAGNOSIS — J181 Lobar pneumonia, unspecified organism: Secondary | ICD-10-CM | POA: Diagnosis not present

## 2016-06-22 DIAGNOSIS — T380X5A Adverse effect of glucocorticoids and synthetic analogues, initial encounter: Secondary | ICD-10-CM | POA: Diagnosis present

## 2016-06-22 DIAGNOSIS — G119 Hereditary ataxia, unspecified: Secondary | ICD-10-CM | POA: Diagnosis present

## 2016-06-22 DIAGNOSIS — Z8249 Family history of ischemic heart disease and other diseases of the circulatory system: Secondary | ICD-10-CM

## 2016-06-22 DIAGNOSIS — J449 Chronic obstructive pulmonary disease, unspecified: Secondary | ICD-10-CM | POA: Diagnosis present

## 2016-06-22 DIAGNOSIS — E876 Hypokalemia: Secondary | ICD-10-CM | POA: Diagnosis present

## 2016-06-22 DIAGNOSIS — J441 Chronic obstructive pulmonary disease with (acute) exacerbation: Secondary | ICD-10-CM | POA: Diagnosis present

## 2016-06-22 DIAGNOSIS — I5042 Chronic combined systolic (congestive) and diastolic (congestive) heart failure: Secondary | ICD-10-CM | POA: Diagnosis present

## 2016-06-22 DIAGNOSIS — I7300 Raynaud's syndrome without gangrene: Secondary | ICD-10-CM | POA: Diagnosis present

## 2016-06-22 DIAGNOSIS — R079 Chest pain, unspecified: Secondary | ICD-10-CM

## 2016-06-22 DIAGNOSIS — D638 Anemia in other chronic diseases classified elsewhere: Secondary | ICD-10-CM | POA: Diagnosis present

## 2016-06-22 DIAGNOSIS — J189 Pneumonia, unspecified organism: Secondary | ICD-10-CM | POA: Diagnosis present

## 2016-06-22 DIAGNOSIS — R739 Hyperglycemia, unspecified: Secondary | ICD-10-CM | POA: Diagnosis present

## 2016-06-22 DIAGNOSIS — N400 Enlarged prostate without lower urinary tract symptoms: Secondary | ICD-10-CM | POA: Diagnosis present

## 2016-06-22 DIAGNOSIS — J9601 Acute respiratory failure with hypoxia: Secondary | ICD-10-CM | POA: Diagnosis present

## 2016-06-22 DIAGNOSIS — E871 Hypo-osmolality and hyponatremia: Secondary | ICD-10-CM | POA: Diagnosis present

## 2016-06-22 DIAGNOSIS — M810 Age-related osteoporosis without current pathological fracture: Secondary | ICD-10-CM | POA: Diagnosis present

## 2016-06-22 DIAGNOSIS — Z79899 Other long term (current) drug therapy: Secondary | ICD-10-CM

## 2016-06-22 DIAGNOSIS — D649 Anemia, unspecified: Secondary | ICD-10-CM | POA: Diagnosis present

## 2016-06-22 DIAGNOSIS — J159 Unspecified bacterial pneumonia: Secondary | ICD-10-CM | POA: Diagnosis present

## 2016-06-22 DIAGNOSIS — J690 Pneumonitis due to inhalation of food and vomit: Secondary | ICD-10-CM | POA: Diagnosis not present

## 2016-06-22 LAB — BASIC METABOLIC PANEL
Anion gap: 10 (ref 5–15)
BUN: 10 mg/dL (ref 6–20)
CHLORIDE: 92 mmol/L — AB (ref 101–111)
CO2: 28 mmol/L (ref 22–32)
CREATININE: 0.85 mg/dL (ref 0.61–1.24)
Calcium: 8.5 mg/dL — ABNORMAL LOW (ref 8.9–10.3)
GFR calc Af Amer: >60 mL/min (ref >60)
GFR calc non Af Amer: >60 mL/min (ref >60)
GLUCOSE: 175 mg/dL — AB (ref 65–99)
POTASSIUM: 2.9 mmol/L — AB (ref 3.5–5.1)
SODIUM: 130 mmol/L — AB (ref 135–145)

## 2016-06-22 LAB — CBC WITH DIFFERENTIAL/PLATELET
Basophils Absolute: 0 10*3/uL (ref 0.0–0.1)
Basophils Relative: 0 %
EOS ABS: 0 10*3/uL (ref 0.0–0.7)
Eosinophils Relative: 0 %
HCT: 34.2 % — ABNORMAL LOW (ref 39.0–52.0)
HEMOGLOBIN: 11.9 g/dL — AB (ref 13.0–17.0)
LYMPHS ABS: 0.8 10*3/uL (ref 0.7–4.0)
LYMPHS PCT: 4 %
MCH: 31.7 pg (ref 26.0–34.0)
MCHC: 34.8 g/dL (ref 30.0–36.0)
MCV: 91.2 fL (ref 78.0–100.0)
Monocytes Absolute: 0.8 10*3/uL (ref 0.1–1.0)
Monocytes Relative: 4 %
NEUTROS PCT: 92 %
Neutro Abs: 20 10*3/uL — ABNORMAL HIGH (ref 1.7–7.7)
Platelets: 379 10*3/uL (ref 150–400)
RBC: 3.75 MIL/uL — AB (ref 4.22–5.81)
RDW: 13.8 % (ref 11.5–15.5)
WBC: 21.6 10*3/uL — ABNORMAL HIGH (ref 4.0–10.5)

## 2016-06-22 LAB — BRAIN NATRIURETIC PEPTIDE: B Natriuretic Peptide: 254.1 pg/mL — ABNORMAL HIGH (ref 0.0–100.0)

## 2016-06-22 LAB — TROPONIN I: Troponin I: <0.03 ng/mL (ref <0.03)

## 2016-06-22 LAB — MAGNESIUM: Magnesium: 3.1 mg/dL — ABNORMAL HIGH (ref 1.7–2.4)

## 2016-06-22 LAB — GLUCOSE, CAPILLARY: Glucose-Capillary: 216 mg/dL — ABNORMAL HIGH (ref 65–99)

## 2016-06-22 MED ORDER — MOMETASONE FURO-FORMOTEROL FUM 200-5 MCG/ACT IN AERO
2.0000 | INHALATION_SPRAY | Freq: Two times a day (BID) | RESPIRATORY_TRACT | Status: DC
  Administered 2016-06-22 – 2016-06-26 (×8): 2 via RESPIRATORY_TRACT
  Filled 2016-06-22: qty 8.8

## 2016-06-22 MED ORDER — LEVOFLOXACIN IN D5W 750 MG/150ML IV SOLN: 750.0000 mg | INTRAVENOUS | Status: DC

## 2016-06-22 MED ORDER — VANCOMYCIN HCL IN DEXTROSE 1-5 GM/200ML-% IV SOLN
1000.0000 mg | Freq: Once | INTRAVENOUS | Status: AC
  Administered 2016-06-22: 1000 mg via INTRAVENOUS
  Filled 2016-06-22: qty 200

## 2016-06-22 MED ORDER — SODIUM CHLORIDE 0.9 % IV SOLN
INTRAVENOUS | Status: DC
  Administered 2016-06-22: 10 mL/h via INTRAVENOUS

## 2016-06-22 MED ORDER — LEVOFLOXACIN IN D5W 750 MG/150ML IV SOLN
750.0000 mg | Freq: Once | INTRAVENOUS | Status: AC
  Administered 2016-06-22: 750 mg via INTRAVENOUS
  Filled 2016-06-22: qty 150

## 2016-06-22 MED ORDER — DM-GUAIFENESIN ER 30-600 MG PO TB12
1.0000 | ORAL_TABLET | Freq: Every day | ORAL | Status: DC
  Administered 2016-06-23 – 2016-06-26 (×4): 1 via ORAL
  Filled 2016-06-22 (×4): qty 1

## 2016-06-22 MED ORDER — INSULIN ASPART 100 UNIT/ML ~~LOC~~ SOLN
0.0000 [IU] | Freq: Every day | SUBCUTANEOUS | Status: DC
  Administered 2016-06-22: 2 [IU] via SUBCUTANEOUS

## 2016-06-22 MED ORDER — FLUTICASONE PROPIONATE 50 MCG/ACT NA SUSP
2.0000 | Freq: Every day | NASAL | Status: DC | PRN
  Filled 2016-06-22: qty 16

## 2016-06-22 MED ORDER — SODIUM CHLORIDE 0.9% FLUSH
3.0000 mL | Freq: Two times a day (BID) | INTRAVENOUS | Status: DC
  Administered 2016-06-22 – 2016-06-23 (×3): 3 mL via INTRAVENOUS

## 2016-06-22 MED ORDER — POTASSIUM CHLORIDE CRYS ER 20 MEQ PO TBCR
40.0000 meq | EXTENDED_RELEASE_TABLET | Freq: Once | ORAL | Status: AC
  Administered 2016-06-22: 40 meq via ORAL
  Filled 2016-06-22: qty 2

## 2016-06-22 MED ORDER — METHYLPREDNISOLONE SODIUM SUCC 125 MG IJ SOLR
60.0000 mg | Freq: Four times a day (QID) | INTRAMUSCULAR | Status: DC
  Administered 2016-06-22 – 2016-06-25 (×10): 60 mg via INTRAVENOUS
  Filled 2016-06-22 (×10): qty 2

## 2016-06-22 MED ORDER — VANCOMYCIN HCL IN DEXTROSE 1-5 GM/200ML-% IV SOLN: 1000.0000 mg | INTRAVENOUS | Status: DC

## 2016-06-22 MED ORDER — SODIUM CHLORIDE 0.9 % IV SOLN: 250.0000 mL | INTRAVENOUS | Status: DC | PRN

## 2016-06-22 MED ORDER — IPRATROPIUM-ALBUTEROL 0.5-2.5 (3) MG/3ML IN SOLN
3.0000 mL | RESPIRATORY_TRACT | Status: DC | PRN
  Filled 2016-06-22: qty 3

## 2016-06-22 MED ORDER — ALBUTEROL (5 MG/ML) CONTINUOUS INHALATION SOLN
10.0000 mg/h | INHALATION_SOLUTION | Freq: Once | RESPIRATORY_TRACT | Status: AC
  Administered 2016-06-22: 10 mg/h via RESPIRATORY_TRACT
  Filled 2016-06-22: qty 20

## 2016-06-22 MED ORDER — ENSURE ENLIVE PO LIQD: 237.0000 mL | Freq: Two times a day (BID) | ORAL | Status: DC

## 2016-06-22 MED ORDER — SODIUM CHLORIDE 0.9% FLUSH: 3.0000 mL | INTRAVENOUS | Status: DC | PRN

## 2016-06-22 MED ORDER — ALBUTEROL SULFATE (2.5 MG/3ML) 0.083% IN NEBU: 5.0000 mg | INHALATION_SOLUTION | Freq: Once | RESPIRATORY_TRACT | Status: DC

## 2016-06-22 MED ORDER — INSULIN ASPART 100 UNIT/ML ~~LOC~~ SOLN
0.0000 [IU] | Freq: Three times a day (TID) | SUBCUTANEOUS | Status: DC
  Administered 2016-06-23 – 2016-06-24 (×5): 1 [IU] via SUBCUTANEOUS
  Administered 2016-06-25: 2 [IU] via SUBCUTANEOUS
  Administered 2016-06-25: 1 [IU] via SUBCUTANEOUS

## 2016-06-22 MED ORDER — ENOXAPARIN SODIUM 40 MG/0.4ML ~~LOC~~ SOLN
40.0000 mg | SUBCUTANEOUS | Status: DC
  Administered 2016-06-23 – 2016-06-26 (×4): 40 mg via SUBCUTANEOUS
  Filled 2016-06-22 (×4): qty 0.4

## 2016-06-22 MED ORDER — IPRATROPIUM BROMIDE 0.02 % IN SOLN
0.5000 mg | Freq: Once | RESPIRATORY_TRACT | Status: AC
  Administered 2016-06-22: 0.5 mg via RESPIRATORY_TRACT
  Filled 2016-06-22: qty 2.5

## 2016-06-22 MED ORDER — IBUPROFEN 200 MG PO TABS
400.0000 mg | ORAL_TABLET | Freq: Four times a day (QID) | ORAL | Status: DC | PRN
  Administered 2016-06-22: 400 mg via ORAL
  Filled 2016-06-22: qty 2

## 2016-06-22 MED ORDER — METHYLPREDNISOLONE SODIUM SUCC 125 MG IJ SOLR
125.0000 mg | Freq: Once | INTRAMUSCULAR | Status: AC
  Administered 2016-06-22: 125 mg via INTRAVENOUS
  Filled 2016-06-22: qty 2

## 2016-06-22 MED ORDER — MAGNESIUM SULFATE 2 GM/50ML IV SOLN
2.0000 g | Freq: Once | INTRAVENOUS | Status: AC
  Administered 2016-06-22: 2 g via INTRAVENOUS
  Filled 2016-06-22: qty 50

## 2016-06-22 NOTE — ED Provider Notes (Signed)
[facility] DEPT Provider Note   CSN: [id number] Arrival date & time: 06/22/16  1602     History   Chief Complaint Chief Complaint  Patient presents with  . Shortness of Breath    HPI Warren [name] is a 61 y.o. male.  62 y/o w/ h/o copd here w/ worsening sob x several days--pt not on home O2 Recently tx for PNA w/ doxycycline and is on prednisone currently Now w/ worsening non-productive cough and DOE Denies orthopnea or chest pain No fever, vomiting, diarrhea Sx worse w/ exertion and better w/ rest and inhalers No leg pain pain or edema, no pleuritic pain      Past Medical History:  Diagnosis Date  . Abnormal MRI of head   . Allergic rhinitis   . Candidal esophagitis (HCC)    Dr. Benson Norway 10/2012  . COPD (chronic obstructive pulmonary disease) ([place])   . Dysarthria 02/16/2014  . Dysphagia, pharyngoesophageal phase 06/15/2014  . 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 Osteoporosis 04/10/2009   L hip fracture; T score -4.2 in 2012.  Fosamax for one year.  . Pneumonia   . Raynaud disease 06/15/2014   Bilateral hands  . 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 Testicular atrophy    Right    Patient Active Problem List   Diagnosis Date Noted  . Chest pain 07/25/2014  . 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 Dysphagia, pharyngoesophageal phase 06/15/2014  . Raynaud disease 06/15/2014  . Dysarthria 02/16/2014  . 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 BPH (benign prostatic hyperplasia) 09/28/2013  . Personal history of colonic polyps 12/02/2012  . Pneumonia 09/29/2012  . Hyponatremia 09/29/2012  . Loss of weight 09/29/2012  . Anemia 09/17/2012  . COPD exacerbation ([place]) 12/21/2010  . Tobacco abuse 12/21/2010    Past Surgical History:  Procedure Laterality Date  . CATARACT EXTRACTION     BOTH EYES  . COLONOSCOPY W/ POLYPECTOMY  12/07/2006   three polyps sigmoid.  [name]. Repeat 3 years.  . COLONOSCOPY W/ POLYPECTOMY  09/24/2012   five sessile polyps removed.  Internal  and external hemorrhoids.  . egd  09/24/2012   hiatal hernia; candidal esophagitis, hematin in stomach; no active source of bleeding.  Marland Kitchen EYE SURGERY  04/10/2014   B cataract surgery  . INGUINAL HERNIA REPAIR     bilateral  . NASAL FRACTURE SURGERY    . VASECTOMY         Home Medications    Prior to Admission medications   Medication Sig Start Date End Date Taking? Authorizing Provider  albuterol (PROVENTIL HFA;VENTOLIN HFA) 108 (90 Base) MCG/ACT inhaler Inhale 2 puffs into the lungs as needed for wheezing or shortness of breath. 03/23/16   Collene Gobble, MD  albuterol (PROVENTIL) (2.5 MG/3ML) 0.083% nebulizer solution Take 3 mLs (2.5 mg total) by nebulization every 6 (six) hours as needed for wheezing or shortness of breath. Patient not taking: Reported on 06/13/2016 07/14/15   Wardell Honour, MD  budesonide-formoterol [facility]) 160-4.5 MCG/ACT inhaler Inhale 2 puffs into the lungs 2 (two) times daily. 06/13/16   Juanito Doom, MD  budesonide-formoterol [facility]) 160-4.5 MCG/ACT inhaler Inhale 2 puffs into the lungs 2 (two) times daily. 06/13/16   Juanito Doom, MD  doxycycline (VIBRA-TABS) 100 MG tablet Take 1 tablet (100 mg total) by mouth 2 (two) times daily. 06/19/16   Collene Gobble, MD  fluticasone (FLONASE) 50 MCG/ACT nasal spray Place into both nostrils daily as needed for allergies or rhinitis (  for allergies).    Historical Provider, MD  Fluticasone-Salmeterol (AIRDUO RESPICLICK 168/37) 290-21 MCG/ACT AEPB Inhale 1 puff into the lungs 2 (two) times daily. 06/13/16   Juanito Doom, MD  ipratropium (ATROVENT) 0.02 % nebulizer solution Take 2.5 mLs (0.5 mg total) by nebulization 4 (four) times daily. 07/14/15   Wardell Honour, MD  predniSONE (DELTASONE) 10 MG tablet Take 40mg  po daily for 3 days, then take 30mg  po daily for 3 days, then take 20mg  po daily for two days, then take 10mg  po daily for 2 days 06/13/16   Juanito Doom, MD  predniSONE (DELTASONE) 20 MG tablet Take 1  tablet (20 mg total) by mouth daily with breakfast. 04/17/16   Collene Gobble, MD  Spacer/Aero-Holding Chambers (E-Z SPACER) inhaler Use as instructed Patient not taking: Reported on 03/23/2016 03/22/14   Tereasa Coop, PA-C    Family History Family History  Problem Relation Age of Onset  . Heart disease Father     valve replacement; CHF; heart transplant candidate  . Hypertension Mother   . Hyperlipidemia Mother   . Diabetes Mother   . Stroke Mother 34    cause of death.  . Diabetes Sister   . Hyperlipidemia Sister   . Stroke Brother     Social History Social History  Substance Use Topics  . Smoking status: Former Smoker    Packs/day: 1.50    Years: 30.00    Quit date: 07/25/2014  . Smokeless tobacco: Never Used     Comment: pt says he is smoking 5 or 6 cigarettes daily  . Alcohol use No     Comment: 2 beers monthly     Allergies   Contrast media [iodinated diagnostic agents] and Iodine   Review of Systems Review of Systems  All other systems reviewed and are negative.    Physical Exam Updated Vital Signs BP 117/69 (BP Location: Right Arm)   Pulse (!) 141   Temp 98.4 F (36.9 C) (Axillary)   Resp (!) 24   SpO2 94%   Physical Exam  Constitutional: He is oriented to person, place, and time. He appears well-developed and well-nourished.  Non-toxic appearance. No distress.  HENT:  Head: Normocephalic and atraumatic.  Eyes: Conjunctivae, EOM and lids are normal. Pupils are equal, round, and reactive to light.  Neck: Normal range of motion. Neck supple. No tracheal deviation present. No thyroid mass present.  Cardiovascular: Regular rhythm and normal heart sounds.  Tachycardia present.  Exam reveals no gallop.   No murmur heard. Pulmonary/Chest: Effort normal. No stridor. No respiratory distress. He has decreased breath sounds in the right upper field and the left upper field. He has wheezes in the right upper field and the left upper field. He has no rhonchi.  He has no rales.  Abdominal: Soft. Normal appearance and bowel sounds are normal. He exhibits no distension. There is no tenderness. There is no rebound and no CVA tenderness.  Musculoskeletal: Normal range of motion. He exhibits no edema or tenderness.  Neurological: He is alert and oriented to person, place, and time. He has normal strength. No cranial nerve deficit or sensory deficit. GCS eye subscore is 4. GCS verbal subscore is 5. GCS motor subscore is 6.  Skin: Skin is warm and dry. No abrasion and no rash noted.  Psychiatric: He has a normal mood and affect. His speech is normal and behavior is normal.  Nursing note and vitals reviewed.    ED Treatments / Results  Labs (all labs ordered are listed, but only abnormal results are displayed) Labs Reviewed  CBC WITH DIFFERENTIAL/PLATELET  BASIC METABOLIC PANEL  TROPONIN I  BRAIN NATRIURETIC PEPTIDE    EKG  EKG Interpretation  Date/Time:  Thursday June 22 2016 16:27:19 EDT Ventricular Rate:  136 PR Interval:    QRS Duration: 133 QT Interval:  302 QTC Calculation: 473 R Axis:   -120 Text Interpretation:  Sinus tachycardia Multiform ventricular premature complexes Sinus pause with ventricular escape Consider right atrial enlargement Nonspecific IVCD with LAD Anterior infarct, old Borderline T abnormalities, inferior leads Baseline wander in lead(s) II III aVF V6 Confirmed by [name] Hyer  MD, Rayli Wiederhold ([id number]) on 06/22/2016 4:34:57 PM       Radiology No results found.  Procedures Procedures (including critical care time)  Medications Ordered in ED Medications  albuterol (PROVENTIL) (2.5 MG/3ML) 0.083% nebulizer solution 5 mg (not administered)  0.9 %  sodium chloride infusion (not administered)  methylPREDNISolone sodium succinate (SOLU-MEDROL) 125 mg/2 mL injection 125 mg (not administered)  albuterol (PROVENTIL,VENTOLIN) solution continuous neb (not administered)  ipratropium (ATROVENT) nebulizer solution 0.5 mg (not  administered)     Initial Impression / Assessment and Plan / ED Course  I have reviewed the triage vital signs and the nursing notes.  Pertinent labs & imaging results that were available during my care of the patient were reviewed by me and considered in my medical decision making (see chart for details).     Patient has evidence of pneumonia on x-ray. Start IV antibiotics and will be admitted to the hospitalist service.  Final Clinical Impressions(s) / ED Diagnoses   Final diagnoses:  None    New Prescriptions New Prescriptions   No medications on file     Lacretia Leigh, MD 06/22/16 [phone]

## 2016-06-22 NOTE — Progress Notes (Signed)
Pharmacy Antibiotic Note  Warren [name] is a 61 y.o. male admitted on 06/22/2016 with pneumonia.  Pharmacy has been consulted for Vanc/Levaquin dosing.  Plan: Vancomycin 1g  IV every 24 hours.  Goal trough 15-20 mcg/mL. Levaquin 750mg  IV q24     Temp (24hrs), Avg:98.4 F (36.9 C), Min:98.4 F (36.9 C), Max:98.4 F (36.9 C)   Recent Labs Lab 06/22/16 1706  WBC 21.6*  CREATININE 0.85    Estimated Creatinine Clearance: 57.6 mL/min (by C-G formula based on SCr of 0.85 mg/dL).    Allergies  Allergen Reactions  . Contrast Media [Iodinated Diagnostic Agents] Hives and Itching    Happened 40 years ago  . Iodine Hives    Thank you for allowing pharmacy to be a part of this patient's care.   Adrian Saran, PharmD, BCPS Pager [phone] 06/22/2016 7:00 PM

## 2016-06-22 NOTE — ED Notes (Signed)
Bed: WA22 Expected date:  Expected time:  Means of arrival:  Comments: triage 

## 2016-06-22 NOTE — H&P (Signed)
History and Physical    Warren [name] VOZ:[id number] DOB: [birth year] DOA: 06/22/2016  PCP: No PCP Per Patient   Patient coming from: Home  Chief Complaint: Cough, dyspnea, wheezing   HPI: Warren [name] is a 62 y.o. male with medical history significant for COPD on chronic steroids, now presenting to the emergency department with progressive exertional dyspnea and productive cough despite treatment with antibiotics and steroids. Patient notes that he was in his usual state of health until the insidious development of dyspnea and productive cough for approximately 2 weeks ago.he was evaluated by his pulmonologist on 06/13/2016, diagnosed with acute exacerbation and COPD and treated with doxycycline and increased prednisone. Patient reports some initial improvement for the first 2 days of treatment, but has now been experiencing progressive worsening over the past several days to the point where he is dyspneic at rest. He has a cough that is productive of thick yellow sputum. He denies chest pain or palpitations and denies lower extremity swelling, tenderness, or orthopnea. Patient denies recent long distance travel or sick contacts. He has been using his albuterol inhaler at home with only minimal relief. He does not require supplemental oxygen at home.  ED Course: Upon arrival to the ED, patient is found to be Afebrile, saturating only 87% on room air, tachypneic in the mid 20s, tachycardic to 140, and with stable blood pressure. EKG features a sinus tachycardia with rate 136 and PVCs. Chest x-ray is notable for persistent right lung base pneumonia with a right pleural effusion, as well as stable emphysematous changes. Chemistry panels notable for a sodium of 1:30 and potassium of 2.9. Glucose is elevated to 175. CBC features a leukocytosis to 21,600 and mild normocytic anemia with hemoglobin of 11.9. Troponin was undetectable and BNP was elevated to 254. Patient was treated with duo nebs, 125 mg IV  Solu-Medrol, and started on empiric Levaquin and vancomycin. Patient is oxygenating and mentating well with 3 L/m supplemental oxygen, tachycardia has improved, and blood pressures remained stable. He will be admitted to the telemetry unit for ongoing evaluation and management of acute exacerbation and COPD, likely precipitated by a bacterial pneumonia involving the right lung base.  Review of Systems:  All other systems reviewed and apart from HPI, are negative.  Past Medical History:  Diagnosis Date  . Abnormal MRI of head   . Allergic rhinitis   . Candidal esophagitis (HCC)    Dr. Benson Norway 10/2012  . COPD (chronic obstructive pulmonary disease) ([place])   . Dysarthria 02/16/2014  . Dysphagia, pharyngoesophageal phase 06/15/2014  . 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 Osteoporosis 04/10/2009   L hip fracture; T score -4.2 in 2012.  Fosamax for one year.  . Pneumonia   . Raynaud disease 06/15/2014   Bilateral hands  . 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 Testicular atrophy    Right    Past Surgical History:  Procedure Laterality Date  . CATARACT EXTRACTION     BOTH EYES  . COLONOSCOPY W/ POLYPECTOMY  12/07/2006   three polyps sigmoid.  [name]. Repeat 3 years.  . COLONOSCOPY W/ POLYPECTOMY  09/24/2012   five sessile polyps removed.  Internal and external hemorrhoids.  . egd  09/24/2012   hiatal hernia; candidal esophagitis, hematin in stomach; no active source of bleeding.  Marland Kitchen EYE SURGERY  04/10/2014   B cataract surgery  . INGUINAL HERNIA REPAIR     bilateral  . NASAL FRACTURE SURGERY    . VASECTOMY  reports that he quit smoking about 22 months ago. He has a 45.00 pack-year smoking history. He has never used smokeless tobacco. He reports that he does not drink alcohol or use drugs.  Allergies  Allergen Reactions  . Contrast Media [Iodinated Diagnostic Agents] Hives and Itching    Happened 40 years ago  . Iodine Hives    Family History  Problem Relation Age of Onset  . Heart  disease Father     valve replacement; CHF; heart transplant candidate  . Hypertension Mother   . Hyperlipidemia Mother   . Diabetes Mother   . Stroke Mother 33    cause of death.  . Diabetes Sister   . Hyperlipidemia Sister   . Stroke Brother      Prior to Admission medications   Medication Sig Start Date End Date Taking? Authorizing Provider  albuterol (PROVENTIL) (2.5 MG/3ML) 0.083% nebulizer solution Take 3 mLs (2.5 mg total) by nebulization every 6 (six) hours as needed for wheezing or shortness of breath. 07/14/15  Yes Wardell Honour, MD  budesonide-formoterol [facility]) 160-4.5 MCG/ACT inhaler Inhale 2 puffs into the lungs 2 (two) times daily. 06/13/16  Yes Juanito Doom, MD  dextromethorphan-guaiFENesin [facility] DM) 30-600 MG 12hr tablet Take 1 tablet by mouth daily.   Yes Historical Provider, MD  doxycycline (VIBRA-TABS) 100 MG tablet Take 1 tablet (100 mg total) by mouth 2 (two) times daily. 06/19/16  Yes Collene Gobble, MD  fluticasone (FLONASE) 50 MCG/ACT nasal spray Place into both nostrils daily as needed for allergies or rhinitis (for allergies).   Yes Historical Provider, MD  ibuprofen (ADVIL,MOTRIN) 200 MG tablet Take 400 mg by mouth every 6 (six) hours as needed for mild pain or moderate pain.   Yes Historical Provider, MD  ipratropium (ATROVENT) 0.02 % nebulizer solution Take 2.5 mLs (0.5 mg total) by nebulization 4 (four) times daily. 07/14/15  Yes Wardell Honour, MD  predniSONE (DELTASONE) 20 MG tablet Take 1 tablet (20 mg total) by mouth daily with breakfast. 04/17/16  Yes Collene Gobble, MD  Spacer/Aero-Holding Chambers (E-Z SPACER) inhaler Use as instructed 03/22/14  Yes Tereasa Coop, PA-C  albuterol (PROVENTIL HFA;VENTOLIN HFA) 108 (90 Base) MCG/ACT inhaler Inhale 2 puffs into the lungs as needed for wheezing or shortness of breath. Patient not taking: Reported on 06/22/2016 03/23/16   Collene Gobble, MD  budesonide-formoterol [facility]) 160-4.5 MCG/ACT inhaler Inhale  2 puffs into the lungs 2 (two) times daily. 06/13/16   Juanito Doom, MD    Physical Exam: Vitals:   06/22/16 1608 06/22/16 1701 06/22/16 1702 06/22/16 1908  BP:    96/64  Pulse:    (!) 123  Resp:    (!) 34  Temp:      TempSrc:      SpO2: 94% 94% 98% 95%      Constitutional: Respiratory distress with tachypnea and accessory muscle recruitment. No pallor. No diaphoresis. Cachectic.  Eyes: PERTLA, lids and conjunctivae normal ENMT: Mucous membranes are moist. Posterior pharynx clear of any exudate or lesions.   Neck: normal, supple, no masses, no thyromegaly Respiratory: Markedly diminished bilaterally with end-expiratory wheezes. Tachypnea, retractions. No pallor or cyaonsis.  Cardiovascular: Rate ~120 and regular. No carotid bruits. No significant JVD. Abdomen: No distension, no tenderness, no masses palpated. Bowel sounds normal.  Musculoskeletal: no clubbing / cyanosis. No joint deformity upper and lower extremities. Normal muscle tone.  Skin: no significant rashes, lesions, ulcers. Warm, dry, well-perfused.  Neurologic: CN 2-12 grossly  intact. Sensation intact, DTR normal. Strength 5/5 in all 4 limbs.  Psychiatric: Normal judgment and insight. Alert and oriented x 3. Normal mood and affect.     Labs on Admission: I have personally reviewed following labs and imaging studies  CBC:  Recent Labs Lab 06/22/16 1706  WBC 21.6*  NEUTROABS 20.0*  HGB 11.9*  HCT 34.2*  MCV 91.2  PLT 545   Basic Metabolic Panel:  Recent Labs Lab 06/22/16 1706  NA 130*  K 2.9*  CL 92*  CO2 28  GLUCOSE 175*  BUN 10  CREATININE 0.85  CALCIUM 8.5*   GFR: Estimated Creatinine Clearance: 57.6 mL/min (by C-G formula based on SCr of 0.85 mg/dL). Liver Function Tests: No results for input(s): AST, ALT, ALKPHOS, BILITOT, PROT, ALBUMIN in the last 168 hours. No results for input(s): LIPASE, AMYLASE in the last 168 hours. No results for input(s): AMMONIA in the last 168  hours. Coagulation Profile: No results for input(s): INR, PROTIME in the last 168 hours. Cardiac Enzymes:  Recent Labs Lab 06/22/16 1706  TROPONINI <0.03   BNP (last 3 results) No results for input(s): PROBNP in the last 8760 hours. HbA1C: No results for input(s): HGBA1C in the last 72 hours. CBG: No results for input(s): GLUCAP in the last 168 hours. Lipid Profile: No results for input(s): CHOL, HDL, LDLCALC, TRIG, CHOLHDL, LDLDIRECT in the last 72 hours. Thyroid Function Tests: No results for input(s): TSH, T4TOTAL, FREET4, T3FREE, THYROIDAB in the last 72 hours. Anemia Panel: No results for input(s): VITAMINB12, FOLATE, FERRITIN, TIBC, IRON, RETICCTPCT in the last 72 hours. Urine analysis:    Component Value Date/Time   COLORURINE YELLOW 09/08/2007 0719   APPEARANCEUR CLEAR 09/08/2007 0719   LABSPEC 1.010 09/08/2007 0719   PHURINE 6.5 09/08/2007 0719   GLUCOSEU NEGATIVE 09/08/2007 0719   HGBUR SMALL (A) 09/08/2007 0719   BILIRUBINUR negative 07/14/2015 1655   BILIRUBINUR neg 09/28/2013 1333   KETONESUR negative 07/14/2015 1655   KETONESUR NEGATIVE 09/08/2007 0719   PROTEINUR negative 07/14/2015 1655   PROTEINUR neg 09/28/2013 1333   PROTEINUR NEGATIVE 09/08/2007 0719   UROBILINOGEN 0.2 07/14/2015 1655   UROBILINOGEN 0.2 09/08/2007 0719   NITRITE Negative 07/14/2015 1655   NITRITE neg 09/28/2013 1333   NITRITE NEGATIVE 09/08/2007 0719   LEUKOCYTESUR Negative 07/14/2015 1655   Sepsis Labs: @LABRCNTIP (procalcitonin:4,lacticidven:4) )No results found for this or any previous visit (from the past 240 hour(s)).   Radiological Exams on Admission: Dg Chest 2 View  Result Date: 06/22/2016 CLINICAL DATA:  Recent pneumonia.  Shortness of breath. EXAM: CHEST  2 VIEW COMPARISON:  06/13/2016 FINDINGS: The heart size is normal. There is a small right pleural effusion. Lungs are hyperinflated and there are chronic coarsened interstitial markings compatible with COPD/emphysema.  Right lung base airspace consolidation involving the anterior right lung base is identified compatible with pneumonia. IMPRESSION: 1. Persistent right lung base pneumonia. 2. Right pleural effusion 3. COPD/emphysema Electronically Signed   By: Kerby Moors M.D.   On: 06/22/2016 18:23    EKG: Independently reviewed. Sinus tachycardia (rate 136), PVC's, non-specific IVCD.   Assessment/Plan  1. Pneumonia with acute hypoxic respiratory failure  - Pt presents with productive cough, hypoxia, and consolidation in right base on CXR  - He has failed outpatient treatment with doxycycline; he is immunosuppressed with chronic prednisone 20 mg qD - Check sputum culture, MRSA screen, strep pneumo urine antigen, and continue empiric vancomycin and Levaquin - Continue supplemental O2 to maintain sat >92%   -  Pro-calcitonin not reliable given chronic prednisone 20 mg qD; follow cultures and clinical response to therapy   2. COPD with acute exacerbation  - Likely precipitated by the infection - Given 125 mg IV Solu-Medrol abx, and nebs in ED - Continue scheduled ICS/LABA with Dulera, prn DuoNebs, IV Solu-Medrol, and supplemental O2; abx as above    3. Hypokalemia  - Serum potassium 2.9 on admission with IVCD on EKG  - Albuterol likely contributing  - Treated with 40 mEq oral potassium on admission  - Monitor on telemetry, check mag, repeat chem panel in am    4. Chronic combined systolic/diastolic CHF  - Pt appears a little dry on admission  - TTE (07/27/14) with EF 12-92%, grade 1 diastolic dysfunction, hypokinesis of inferior myocardium, and moderate TR  - Has been well-compensated, not requiring diuretics at home  - Plan to follow I/O's and daily wts, allow ad lib oral hydration    5. Hyperglycemia  - Likely steroid-induced  - Check A1c, cover with low-intensity SSI     DVT prophylaxis: sq Lovenox  Code Status: Full  Family Communication: Son and wife updated at bedside Disposition Plan:  Admit to telemetry Consults called: None Admission status: Inpatient    Vianne Bulls, MD [facility] Pager [phone]  If 7PM-7AM, please contact night-coverage [email or web address] Password [facility]  06/22/2016, 9:07 PM

## 2016-06-22 NOTE — ED Triage Notes (Signed)
Patient reports dx with pneumonia on 3/6. States he has been taking abx as prescribed with no relief. Reports SOB, productive cough, and back pain 7/10. Denies chest pain.

## 2016-06-23 ENCOUNTER — Inpatient Hospital Stay (HOSPITAL_COMMUNITY): Payer: BLUE CROSS/BLUE SHIELD

## 2016-06-23 DIAGNOSIS — E43 Unspecified severe protein-calorie malnutrition: Secondary | ICD-10-CM | POA: Insufficient documentation

## 2016-06-23 DIAGNOSIS — J181 Lobar pneumonia, unspecified organism: Secondary | ICD-10-CM

## 2016-06-23 LAB — GLUCOSE, CAPILLARY
Glucose-Capillary: 141 mg/dL — ABNORMAL HIGH (ref 65–99)
Glucose-Capillary: 129 mg/dL — ABNORMAL HIGH (ref 65–99)
Glucose-Capillary: 141 mg/dL — ABNORMAL HIGH (ref 65–99)
Glucose-Capillary: 122 mg/dL — ABNORMAL HIGH (ref 65–99)

## 2016-06-23 LAB — CBC WITH DIFFERENTIAL/PLATELET
Basophils Absolute: 0 10*3/uL (ref 0.0–0.1)
Basophils Relative: 0 %
Eosinophils Absolute: 0 10*3/uL (ref 0.0–0.7)
Eosinophils Relative: 0 %
HEMATOCRIT: 28 % — AB (ref 39.0–52.0)
Hemoglobin: 9.7 g/dL — ABNORMAL LOW (ref 13.0–17.0)
LYMPHS ABS: 0.7 10*3/uL (ref 0.7–4.0)
LYMPHS PCT: 5 %
MCH: 31.1 pg (ref 26.0–34.0)
MCHC: 34.6 g/dL (ref 30.0–36.0)
MCV: 89.7 fL (ref 78.0–100.0)
MONO ABS: 0.2 10*3/uL (ref 0.1–1.0)
Monocytes Relative: 1 %
NEUTROS ABS: 13.6 10*3/uL — AB (ref 1.7–7.7)
Neutrophils Relative %: 94 %
Platelets: 335 10*3/uL (ref 150–400)
RBC: 3.12 MIL/uL — ABNORMAL LOW (ref 4.22–5.81)
RDW: 13.7 % (ref 11.5–15.5)
WBC: 14.5 10*3/uL — ABNORMAL HIGH (ref 4.0–10.5)

## 2016-06-23 LAB — BASIC METABOLIC PANEL
ANION GAP: 7 (ref 5–15)
BUN: 10 mg/dL (ref 6–20)
CALCIUM: 7.8 mg/dL — AB (ref 8.9–10.3)
CHLORIDE: 93 mmol/L — AB (ref 101–111)
CO2: 28 mmol/L (ref 22–32)
Creatinine, Ser: 0.66 mg/dL (ref 0.61–1.24)
GFR calc Af Amer: >60 mL/min (ref >60)
GFR calc non Af Amer: >60 mL/min (ref >60)
GLUCOSE: 128 mg/dL — AB (ref 65–99)
POTASSIUM: 3.6 mmol/L (ref 3.5–5.1)
Sodium: 128 mmol/L — ABNORMAL LOW (ref 135–145)

## 2016-06-23 LAB — STREP PNEUMONIAE URINARY ANTIGEN: Strep Pneumo Urinary Antigen: NEGATIVE

## 2016-06-23 MED ORDER — SODIUM CHLORIDE 0.9 % IV SOLN
1.5000 g | Freq: Three times a day (TID) | INTRAVENOUS | Status: DC
  Administered 2016-06-23 – 2016-06-25 (×6): 1.5 g via INTRAVENOUS
  Filled 2016-06-23 (×7): qty 1.5

## 2016-06-23 MED ORDER — SODIUM CHLORIDE 0.9 % IV SOLN: 250.0000 mL | INTRAVENOUS | Status: DC | PRN

## 2016-06-23 MED ORDER — IBUPROFEN 200 MG PO TABS
400.0000 mg | ORAL_TABLET | Freq: Once | ORAL | Status: AC
  Administered 2016-06-23: 400 mg via ORAL
  Filled 2016-06-23: qty 2

## 2016-06-23 MED ORDER — ORAL CARE MOUTH RINSE
15.0000 mL | Freq: Two times a day (BID) | OROMUCOSAL | Status: DC
  Administered 2016-06-24 (×2): 15 mL via OROMUCOSAL

## 2016-06-23 MED ORDER — BOOST PLUS PO LIQD
237.0000 mL | ORAL | Status: DC
  Administered 2016-06-24 – 2016-06-25 (×2): 237 mL via ORAL
  Filled 2016-06-23 (×4): qty 237

## 2016-06-23 MED ORDER — CHLORHEXIDINE GLUCONATE 0.12 % MT SOLN
15.0000 mL | Freq: Two times a day (BID) | OROMUCOSAL | Status: DC
  Administered 2016-06-23 – 2016-06-26 (×8): 15 mL via OROMUCOSAL
  Filled 2016-06-23 (×7): qty 15

## 2016-06-23 MED ORDER — ENSURE ENLIVE PO LIQD
237.0000 mL | ORAL | Status: DC
  Administered 2016-06-24 – 2016-06-26 (×3): 237 mL via ORAL

## 2016-06-23 NOTE — Progress Notes (Signed)
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 And right basilar pneumonia-initial white count 21 Found also hypokalemia 2.9, sodium 1:30 Potassium replaced, placed on Solu-Medrol in the emergency room   Assessment & Plan:   Principal Problem:   Pneumonia Active Problems:   COPD exacerbation (HCC)   Anemia   Hyponatremia   Chronic combined systolic and diastolic CHF (congestive heart failure) (HCC)   Hypokalemia   Acute respiratory failure with hypoxia (HCC)   Likely aspiration pneumonia-change empiric abx antibiotics to Unasyn. Speech therapy to see-MBS ordereed. Continue IV saline. Have discontinued doxycycline from PTA-wbc 21-->14 Severe COPD-given Solu-Medrol 125, continue 60 every 6, continued albuterol with DuoNeb every 2 when necessary Fl, Dulera 2 puffs twice a day Chronic diastolic heart failure-not on any diuretics as outpatient. Might benefit from hydralazine and nitrate. Hesitate to use BB Hypovolemic hyponatremia-Sodium low.  Start saline 75 cc/h.   Monitor labs am Spinocerebellar ataxia type VI-outpatient follow-up needed with neurologist-sounds like progressive symptoms and worsening over past 1-2 mo.  Less likely to be able to go  home.  Therapy to evaluate.  Will forward to Dr. Jannifer Franklin his neurologist for review-no known cure so might end up needing institutionalization Mod-severe malnutrition-Has lost weigh ~ 7 lbs past 2 weeks.  Nutritionist to eval once Speech therapy reviews him   DVT prophylaxis: Lovenox Code Status: Full Family Communication: called wife and d/c her on phone 3/16 Disposition Plan: inpatient   Consultants:   none  Procedures:     Antimicrobials:   vanc 3/15--3/16  levaquin 3/15--3/16  unasyn 3/16    Subjective:   hasnt been able to get up on his own over 2 weeks Past 6 mo here has been slowing down-has lost weight lost 7 pounds  Quit smoking 2 yr ago  No better today no fever no chills   Objective: Vitals:   06/23/16 0058 06/23/16 0520 06/23/16 0739 06/23/16 0742  BP:  127/90    Pulse:  (!) 103    Resp:  (!) 32    Temp:  97.9 F (36.6 C)    TempSrc:  Oral    SpO2: 96% 95% 92% 92%  Weight:      Height:        Intake/Output Summary (Last 24 hours) at 06/23/16 0754 Last data filed at 06/23/16 0200  Gross per 24 hour  Intake                0 ml  Output              400 ml  Net             -400 ml   Filed Weights   06/22/16 2158  Weight: 43.5 kg (95 lb 14.4 oz)    Examination:  General exam:  Appears calm and comfortable.  frail Respiratory system: Clear to auscultation. Respiratory effort normal.  No rales rhonchi Cardiovascular system: S1 & S2 heard, RRR. No JVD Gastrointestinal system: Abdomen is nondistended, soft Central nervous system: Alert and oriented. Mild tremor Extremities: Symmetric 5 x 5 power. Skin: No rashes, lesions or ulcers Psychiatry: Judgement and insight appear normal. Mood & affect appropriate.     Data Reviewed: I have personally reviewed following labs and imaging studies  CBC:  Recent Labs Lab 06/22/16 1706 06/23/16 0445  WBC 21.6* 14.5*  NEUTROABS 20.0* 13.6*  HGB 11.9* 9.7*  HCT 34.2* 28.0*  MCV 91.2 89.7  PLT 379  413   Basic Metabolic Panel:  Recent Labs Lab 06/22/16 1706 06/22/16 2157 06/23/16 0445  NA 130*  --  128*  K 2.9*  --  3.6  CL 92*  --  93*  CO2 28  --  28  GLUCOSE 175*  --  128*  BUN 10  --  10  CREATININE 0.85  --  0.66  CALCIUM 8.5*  --  7.8*  MG  --  3.1*  --    GFR: Estimated Creatinine Clearance: 58.9 mL/min (by C-G formula based on SCr of 0.66 mg/dL). Liver Function Tests: No results for input(s): AST, ALT, ALKPHOS, BILITOT, PROT, ALBUMIN in the last 168 hours. No results for input(s): LIPASE, AMYLASE in the last 168 hours. No results for input(s): AMMONIA in the last 168 hours. Coagulation Profile: No results for input(s): INR, PROTIME in the last 168 hours. Cardiac Enzymes:  Recent Labs Lab 06/22/16 1706  TROPONINI <0.03   BNP (last 3 results) No results for input(s): PROBNP in the last 8760 hours. HbA1C: No results for input(s): HGBA1C in the last 72 hours. CBG:  Recent Labs Lab 06/22/16 2242  GLUCAP 216*   Lipid Profile: No results for input(s): CHOL, HDL, LDLCALC, TRIG, CHOLHDL, LDLDIRECT in the last 72 hours. Thyroid Function Tests: No results for input(s): TSH, T4TOTAL, FREET4, T3FREE, THYROIDAB in the last 72 hours. Anemia Panel: No results for input(s): VITAMINB12, FOLATE, FERRITIN, TIBC, IRON, RETICCTPCT in the last 72 hours. Urine analysis:    Component Value Date/Time   COLORURINE YELLOW 09/08/2007 0719   APPEARANCEUR CLEAR 09/08/2007 0719   LABSPEC 1.010 09/08/2007 0719   PHURINE 6.5 09/08/2007 0719   GLUCOSEU NEGATIVE 09/08/2007 0719   HGBUR SMALL (A) 09/08/2007 0719   BILIRUBINUR negative 07/14/2015 1655   BILIRUBINUR neg 09/28/2013 1333   KETONESUR negative 07/14/2015 1655   KETONESUR NEGATIVE 09/08/2007 0719   PROTEINUR negative 07/14/2015 1655   PROTEINUR neg 09/28/2013 1333   PROTEINUR NEGATIVE 09/08/2007 0719   UROBILINOGEN 0.2 07/14/2015 1655   UROBILINOGEN 0.2 09/08/2007 0719   NITRITE Negative 07/14/2015 1655    NITRITE neg 09/28/2013 1333   NITRITE NEGATIVE 09/08/2007 0719   LEUKOCYTESUR Negative 07/14/2015 1655   Sepsis Labs: @LABRCNTIP (procalcitonin:4,lacticidven:4)  )No results found for this or any previous visit (from the past 240 hour(s)).       Radiology Studies: Dg Chest 2 View  Result Date: 06/22/2016 CLINICAL DATA:  Recent pneumonia.  Shortness of breath. EXAM: CHEST  2 VIEW COMPARISON:  06/13/2016 FINDINGS: The heart size is normal. There is a small right pleural effusion. Lungs are hyperinflated and there are chronic coarsened interstitial markings compatible with COPD/emphysema. Right lung base airspace consolidation involving the anterior right lung base is identified compatible with pneumonia. IMPRESSION: 1. Persistent right lung base pneumonia. 2. Right pleural effusion 3. COPD/emphysema Electronically Signed   By:  Kerby Moors M.D.   On: 06/22/2016 18:23        Scheduled Meds: . ampicillin-sulbactam (UNASYN) IV  1.5 g Intravenous Q8H  . chlorhexidine  15 mL Mouth Rinse BID  . dextromethorphan-guaiFENesin  1 tablet Oral Daily  . enoxaparin (LOVENOX) injection  40 mg Subcutaneous Q24H  . feeding supplement (ENSURE ENLIVE)  237 mL Oral BID BM  . insulin aspart  0-5 Units Subcutaneous QHS  . insulin aspart  0-9 Units Subcutaneous TID WC  . mouth rinse  15 mL Mouth Rinse q12n4p  . methylPREDNISolone (SOLU-MEDROL) injection  60 mg Intravenous Q6H  . mometasone-formoterol  2 puff Inhalation BID  . sodium chloride flush  3 mL Intravenous Q12H   Continuous Infusions:   LOS: 1 day    Time spent: [place], MD [facility] ([phone]   If 7PM-7AM, please contact night-coverage [email or web address] Password [facility] 06/23/2016, 7:54 AM

## 2016-06-23 NOTE — Progress Notes (Signed)
Initial Nutrition Assessment  DOCUMENTATION CODES:   Severe malnutrition in context of chronic illness  INTERVENTION:  - Ensure q 24 hours, each supplement provides 350 calories and 20 grams protein - Boost Plus q 24 hours, each supplement provides 360 calories and 14 grams protein - Mighty Shake q 24 hours, each supplement provides 500 calories and 23 grams protein  NUTRITION DIAGNOSIS:   Malnutrition related to chronic illness as evidenced by severe depletion of muscle mass, severe depletion of body fat, percent weight loss.  GOAL:   Patient will meet greater than or equal to 90% of their needs  MONITOR:   PO intake, Supplement acceptance, I & O's, Labs, Weight trends  REASON FOR ASSESSMENT:   Malnutrition Screening Tool    ASSESSMENT:  61 y.o. Male with PMH for COPD on chronic steroids, presented to the ED with progressive exertional dyspnea and productive cough. Pt was evaluated by his pulmonologist 2 weeks ago and diagnosed with acute exacerbation and COPD. Pt diagnosed with pneumonia.   Pt SOB and unable to elaborate extensively at time of visit.  Pt reports 7 lb weight loss in 3 weeks. Per chart review pt had a stable weight status over the past year but has experienced recent weight loss over the past week and a half as well as loss of 9 lbs in three months (8.6%-significant for time frame)   Wt Readings from Last 10 Encounters:  06/22/16 95 lb 14.4 oz (43.5 kg)  06/13/16 99 lb 9.6 oz (45.2 kg)  03/23/16 104 lb 3.2 oz (47.3 kg)  01/20/16 102 lb 6.4 oz (46.4 kg)  11/16/15 103 lb (46.7 kg)  09/28/15 102 lb (46.3 kg)  08/27/15 102 lb (46.3 kg)  07/14/15 103 lb (46.7 kg)  06/17/15 103 lb 12.8 oz (47.1 kg)   Pt has no meal completion percentages recorded at this time. Pt reports PTA he was consuming 2 meals/d, 1 small and 1 big, but could not elaborate further.   Pt reports drinking too many Ensure PTA and now he is tired of them so interesting in sampling  additional supplements such as Mighty Shake and Boost Plus Chocolate  Labs reviewed; CBG (141-216), Na (128), Magnesium (3.1) Medications reviewed; Sliding scale insulin, 60 mg Solu-medrol   Nutrition-focused physical exam completed. Findings include severe fat depletion, severe muscle depletion, and no edema.  Diet Order:  Diet Heart Room service appropriate? Yes with Assist; Fluid consistency: Thin  Skin:  Reviewed, no issues  Last BM:  No BM Recorded  Height:   Ht Readings from Last 1 Encounters:  06/22/16 5\' 7"  (1.702 m)    Weight:   Wt Readings from Last 1 Encounters:  06/22/16 95 lb 14.4 oz (43.5 kg)    Ideal Body Weight:  67.3 kg  BMI:  Body mass index is 15.02 kg/m.  Estimated Nutritional Needs:   Kcal:  1300-1500 (30-35 kcal/kg)  Protein:  55-65 g/kg (1.3-1.5 g/kg)  Fluid:  >/= 1.3 L/d  EDUCATION NEEDS:   Education needs no appropriate at this time  [employer]

## 2016-06-23 NOTE — Progress Notes (Signed)
Modified Barium Swallow Progress Note  Patient Details  Name: Warren [name] MRN: [id number] Date of Birth: January 29, 1956  Today's Date: 06/23/2016  Modified Barium Swallow completed.  Full report located under Chart Review in the Imaging Section.  Brief recommendations include the following:  Clinical Impression  Pt presents with findings largely comparative to prior MBS in May 2016.  Decreased oral control again allows premature spillage of barium into pharynx and mild oral residuals.     Pt sensed oral residuals of solids but not liquids and mild intermittent liquid residue spills into pharynx without consistent reflexive swallow.  Decreased tongue base retraction/epiglottic deflection allows vallecular residuals Barium tablet swallowed with thin lodged at vallecular space and reflexive dry swallow transited it into pharynx..  Dry swallows and liquids faciliate clearance of oropharyngeal residuals.  NO aspiration or deep laryngeal penetration observed.  Pt did become dyspneic during MBS and this will increase episodic aspiration risk.  Pt today was unable to perform lingual propulsion to clear vallecular residuals *as he conducted on MBS two years prior.  Encourged him to strengthen his swallow, expectoration ability for maximal airway protection.    Recommend continue diet as tolerated with compensation strategies.    Encouraged pt to order soft foods.    SLP will follow up while pt in house to maximize swallow rehab  and airway protection.  Educated pt and family using live video and teach back to findings.    Swallow Evaluation Recommendations       SLP Diet Recommendations: Regular solids;Thin liquid   Liquid Administration via: Cup;No straw (per pt no straw)   Medication Administration:  (as tolerated)   Supervision: Patient able to self feed   Compensations: Slow rate;Small sips/bites;Multiple dry swallows after each bite/sip;Follow solids with liquid;Other (Comment) (expectorate  prn)   Postural Changes: Seated upright at 90 degrees;Remain semi-upright after after feeds/meals (Comment)   Oral Care Recommendations: Oral care BID        Janett Labella Brinson, [place] [facility] SLP [phone]

## 2016-06-23 NOTE — Evaluation (Signed)
Clinical/Bedside Swallow Evaluation Patient Details  Name: Warren [name] MRN: [id number] Date of Birth: [birth year]  Today's Date: 06/23/2016 Time: SLP Start Time (ACUTE ONLY): 0900 SLP Stop Time (ACUTE ONLY): 0920 SLP Time Calculation (min) (ACUTE ONLY): 20 min  Past Medical History:  Past Medical History:  Diagnosis Date  . Abnormal MRI of head   . Allergic rhinitis   . Candidal esophagitis (HCC)    Dr. Benson [place] 10/2012  . COPD (chronic obstructive pulmonary disease) ([place])   . Dysarthria 02/16/2014  . Dysphagia, pharyngoesophageal phase 06/15/2014  . 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 Osteoporosis 04/10/2009   L hip fracture; T score -4.2 in 2012.  Fosamax for one year.  . Pneumonia   . Raynaud disease 06/15/2014   Bilateral hands  . 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 Testicular atrophy    Right   Past Surgical History:  Past Surgical History:  Procedure Laterality Date  . CATARACT EXTRACTION     BOTH EYES  . COLONOSCOPY W/ POLYPECTOMY  12/07/2006   three polyps sigmoid.  [name]. Repeat 3 years.  . COLONOSCOPY W/ POLYPECTOMY  09/24/2012   five sessile polyps removed.  Internal and external hemorrhoids.  . egd  09/24/2012   hiatal hernia; candidal esophagitis, hematin in stomach; no active source of bleeding.  Marland Kitchen EYE SURGERY  04/10/2014   B cataract surgery  . INGUINAL HERNIA REPAIR     bilateral  . NASAL FRACTURE SURGERY    . VASECTOMY     HPI:  61 yo male adm to [facility] with congestion and recent treatment with ABX.  Pt found to have Right lower lobe pna.   [place] + for COPD, candida esophagitis, spinocerebellar ataxia, Raynaud's, tobacco use.  Pt previously has undergone MBS as OP in 2016 May that showed moderate oropharyngeal-cervical esopahgeal dysphagia.  Pt denies problems swallowing, admits to issues with cough x 2 weeks with weakness and poor mobility, weight loss.  Current weight is 97 pounds and pt reports normal weight is 104.  Pt tx with ABX in coummunity but did not improve  respiratory issues.    Assessment / Plan / Recommendation Clinical Impression  Pt presents with clinical signs of multifactorial dysphagia - baseline deficits secondary to spinocerebellar ataxia - likely exacerbated due to acute illness/deconditioning.  Multiple swallows, decreased laryngeal elevation observed during swallow and cough with intake observed.  Given pt Rt LL pna and baseline sensorimotor deficits, MBS indicated.  Pt educated that aspiration may not be fully prevented but mitigated.  Increased recurrent pna risk with aspiration due to COPD/deconditioning.  Pt admits to last pna being two years ago.  Agrees to MBS today.  RN and MD informed and agree.   SLP Visit Diagnosis: Dysphagia, pharyngoesophageal phase (R13.14)    Aspiration Risk  Moderate aspiration risk;Risk for inadequate nutrition/hydration    Diet Recommendation Regular;Thin liquid (pending mbs)   Liquid Administration via: Cup Medication Administration: Other (Comment) (prefer with puree, start and follow with liquids) Supervision: Patient able to self feed Compensations: Minimize environmental distractions;Slow rate;Small sips/bites;Multiple dry swallows after each bite/sip;Other (Comment) ("hock" and expectorate prn) Postural Changes: Seated upright at 90 degrees;Remain upright for at least 30 minutes after po intake    Other  Recommendations Oral Care Recommendations: Oral care BID   Follow up Recommendations Home health SLP      Frequency and Duration min 2x/week  2 weeks       Prognosis Prognosis for Safe Diet Advancement: Fair Barriers to Reach Goals: Time  post onset      Swallow Study   General Date of Onset: 06/23/16 HPI: 61 yo male adm to [facility] with congestion and recent treatment with ABX.  Pt found to have Right lower lobe pna.   [place] + for COPD, candida esophagitis, spinocerebellar ataxia, Raynaud's, tobacco use.  Pt previously has undergone MBS as OP in 2016 May that showed moderate  oropharyngeal-cervical esopahgeal dysphagia.  Pt denies problems swallowing, admits to issues with cough x 2 weeks with weakness and poor mobility, weight loss.  Current weight is 97 pounds and pt reports normal weight is 104.  Pt tx with ABX in coummunity but did not improve respiratory issues.  Type of Study: Bedside Swallow Evaluation Diet Prior to this Study: Regular;Thin liquids Temperature Spikes Noted: No Respiratory Status: Nasal cannula (3) History of Recent Intubation: No Behavior/Cognition: Alert;Cooperative Oral Cavity Assessment: Within Functional Limits Oral Care Completed by SLP: No Oral Cavity - Dentition: Adequate natural dentition Vision: Functional for self-feeding Self-Feeding Abilities: Able to feed self Patient Positioning: Upright in bed Baseline Vocal Quality: Low vocal intensity;Hoarse Volitional Cough: Congested Volitional Swallow: Able to elicit    Oral/Motor/Sensory Function Overall Oral Motor/Sensory Function:  (lingual rolling with oral opening, dysarthric)   Ice Chips Ice chips: Not tested   Thin Liquid Thin Liquid: Impaired Presentation: Cup;Self Fed Pharyngeal  Phase Impairments: Cough - Immediate;Cough - Delayed;Wet Vocal Quality;Multiple swallows;Decreased hyoid-laryngeal movement    Nectar Thick Nectar Thick Liquid: Not tested   Honey Thick Honey Thick Liquid: Not tested   Puree Puree: Not tested   Solid   GO   Solid: Impaired Presentation: Self Fed Oral Phase Impairments: Reduced lingual movement/coordination;Impaired mastication Pharyngeal Phase Impairments: Cough - Delayed;Decreased hyoid-laryngeal movement;Multiple swallows Other Comments: pt consumed liquids ? to assist with pharyngeal clearance        Claudie Fisherman, [place] [facility] SLP [phone]

## 2016-06-24 LAB — COMPREHENSIVE METABOLIC PANEL
ALT: 27 U/L (ref 17–63)
ANION GAP: 9 (ref 5–15)
AST: 19 U/L (ref 15–41)
Albumin: 2.4 g/dL — ABNORMAL LOW (ref 3.5–5.0)
Alkaline Phosphatase: 75 U/L (ref 38–126)
BILIRUBIN TOTAL: 0.5 mg/dL (ref 0.3–1.2)
BUN: 13 mg/dL (ref 6–20)
CHLORIDE: 91 mmol/L — AB (ref 101–111)
CO2: 30 mmol/L (ref 22–32)
Calcium: 8.2 mg/dL — ABNORMAL LOW (ref 8.9–10.3)
Creatinine, Ser: 0.73 mg/dL (ref 0.61–1.24)
Glucose, Bld: 134 mg/dL — ABNORMAL HIGH (ref 65–99)
POTASSIUM: 4.4 mmol/L (ref 3.5–5.1)
Sodium: 130 mmol/L — ABNORMAL LOW (ref 135–145)
TOTAL PROTEIN: 6 g/dL — AB (ref 6.5–8.1)

## 2016-06-24 LAB — HEMOGLOBIN A1C
HEMOGLOBIN A1C: 6.2 % — AB (ref 4.8–5.6)
MEAN PLASMA GLUCOSE: 131 mg/dL

## 2016-06-24 LAB — CBC WITH DIFFERENTIAL/PLATELET
BASOS ABS: 0 10*3/uL (ref 0.0–0.1)
Basophils Relative: 0 %
EOS PCT: 0 %
Eosinophils Absolute: 0 10*3/uL (ref 0.0–0.7)
HEMATOCRIT: 29.6 % — AB (ref 39.0–52.0)
Hemoglobin: 10.4 g/dL — ABNORMAL LOW (ref 13.0–17.0)
LYMPHS PCT: 5 %
Lymphs Abs: 0.8 10*3/uL (ref 0.7–4.0)
MCH: 32.3 pg (ref 26.0–34.0)
MCHC: 35.1 g/dL (ref 30.0–36.0)
MCV: 91.9 fL (ref 78.0–100.0)
MONO ABS: 0.5 10*3/uL (ref 0.1–1.0)
MONOS PCT: 3 %
Neutro Abs: 14.9 10*3/uL — ABNORMAL HIGH (ref 1.7–7.7)
Neutrophils Relative %: 92 %
PLATELETS: 410 10*3/uL — AB (ref 150–400)
RBC: 3.22 MIL/uL — ABNORMAL LOW (ref 4.22–5.81)
RDW: 13.7 % (ref 11.5–15.5)
WBC: 16.2 10*3/uL — ABNORMAL HIGH (ref 4.0–10.5)

## 2016-06-24 LAB — GLUCOSE, CAPILLARY
GLUCOSE-CAPILLARY: 143 mg/dL — AB (ref 65–99)
Glucose-Capillary: 134 mg/dL — ABNORMAL HIGH (ref 65–99)
Glucose-Capillary: 116 mg/dL — ABNORMAL HIGH (ref 65–99)
Glucose-Capillary: 129 mg/dL — ABNORMAL HIGH (ref 65–99)

## 2016-06-24 LAB — HIV ANTIBODY (ROUTINE TESTING W REFLEX): HIV Screen 4th Generation wRfx: NONREACTIVE

## 2016-06-24 LAB — MAGNESIUM: Magnesium: 1.9 mg/dL (ref 1.7–2.4)

## 2016-06-24 MED ORDER — METOPROLOL TARTRATE 25 MG PO TABS
12.5000 mg | ORAL_TABLET | Freq: Two times a day (BID) | ORAL | Status: DC
  Administered 2016-06-24 – 2016-06-25 (×3): 12.5 mg via ORAL
  Filled 2016-06-24 (×5): qty 1

## 2016-06-24 NOTE — Evaluation (Signed)
Physical Therapy Evaluation Patient Details Name: Warren [name] MRN: [id number] DOB: 11-28-1955 Today's Date: 06/24/2016   History of Present Illness  Warren [name] is a 61 y.o. male with medical history significant for COPD on chronic steroids, L hip fx, gait disorder; adm with progressive exertional dyspnea -->pna  Clinical Impression  Pt admitted with above diagnosis. Pt currently with functional limitations due to the deficits listed below (see PT Problem List).  Pt will benefit from skilled PT to increase their independence and safety with mobility to allow discharge to the venue listed below.   Pt significantly deconditioned and will benefit from PT in acute setting; O2 sats decr to 84% on RA  with transition to EOB, DOE 3-4/4 extended recovery time needed;  Will benefit from HHPT and RW--family very supportive, has 24hr assist if needed     Follow Up Recommendations Home health PT;Supervision for mobility/OOB    Equipment Recommendations  Rolling walker with 5" wheels    Recommendations for Other Services       Precautions / Restrictions Precautions Precautions: Fall Precaution Comments: O2 Restrictions Weight Bearing Restrictions: No      Mobility  Bed Mobility Overal bed mobility: Needs Assistance Bed Mobility: Supine to Sit     Supine to sit: Min assist     General bed mobility comments: cues to self assist, incr time, assist with LEs, 3-4/4 DOE with EOB transition; desats to 84% on RA  Transfers Overall transfer level: Needs assistance Equipment used: Rolling walker (2 wheeled) Transfers: Sit to/from [facility] Sit to Stand: Min assist Stand pivot transfers: Min assist       General transfer comment: cues for hand placement and safety; sats 92% on 2.5 L O2; DOE 3-4/4 with transfer (after 2 min sitting rest once to  EOB)  Ambulation/Gait             General Gait Details: deferred d/t dyspnea  Stairs            Wheelchair  Mobility    Modified Rankin (Stroke Patients Only)       Balance Overall balance assessment: Needs assistance;History of Falls Sitting-balance support: Feet supported Sitting balance-Leahy Scale: Fair       Standing balance-Leahy Scale: Poor Standing balance comment: reliant on UE support for static balance                             Pertinent Vitals/Pain Pain Assessment: No/denies pain    Home Living Family/patient expects to be discharged to:: Private residence Living Arrangements: Spouse/significant other   Type of Home: House Home Access: Stairs to enter   Technical brewer of Steps: 3 Home Layout: One level Home Equipment: Cane - single point (O2) Additional Comments: plans to go to sons house --no stairs, one level    Prior Function Level of Independence: Independent with assistive device(s);Independent;Needs assistance   Gait / Transfers Assistance Needed: amb with cane  ADL's / Homemaking Assistance Needed: wife assists with ADLs        Hand Dominance        Extremity/Trunk Assessment   Upper Extremity Assessment Upper Extremity Assessment: Generalized weakness    Lower Extremity Assessment Lower Extremity Assessment: Generalized weakness    Cervical / Trunk Assessment Cervical / Trunk Assessment: Kyphotic  Communication   Communication: Other (comment) (dysarthria--difficult to understand at times)  Cognition Arousal/Alertness: Awake/alert Behavior During Therapy: WFL for tasks assessed/performed Overall Cognitive Status: Within  Functional Limits for tasks assessed                      General Comments      Exercises     Assessment/Plan    PT Assessment Patient needs continued PT services  PT Problem List Decreased strength;Decreased range of motion;Decreased activity tolerance;Decreased balance;Decreased knowledge of use of DME;Decreased mobility;Cardiopulmonary status limiting activity       PT  Treatment Interventions DME instruction;Gait training;Functional mobility training;Therapeutic activities;Therapeutic exercise;Patient/family education    PT Goals (Current goals can be found in the Care Plan section)  Acute Rehab PT Goals Patient Stated Goal: get better PT Goal Formulation: With patient Time For Goal Achievement: 07/01/16 Potential to Achieve Goals: Good    Frequency Min 3X/week   Barriers to discharge        Co-evaluation               End of Session Equipment Utilized During Treatment: Oxygen Activity Tolerance: Patient limited by fatigue Patient left: in chair;with call bell/phone within reach;with family/visitor present;with chair alarm set   PT Visit Diagnosis: Other abnormalities of gait and mobility (R26.89);Muscle weakness (generalized) (M62.81)         Time: 1042-1100 PT Time Calculation (min) (ACUTE ONLY): 18 min   Charges:   PT Evaluation $PT Eval Moderate Complexity: 1 Procedure     PT G Codes:         Arwen Haseley 16-Jul-2016, 11:15 AM

## 2016-06-24 NOTE — Progress Notes (Signed)
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 And right basilar pneumonia-initial white count 21 Found also hypokalemia 2.9, sodium 130 Potassium replaced, placed on Solu-Medrol in the emergency room   Assessment & Plan:   Principal Problem:   Pneumonia Active Problems:   COPD exacerbation ([place])   Anemia   Hyponatremia   Chronic combined systolic and diastolic CHF (congestive heart failure) (HCC)   Hypokalemia   Acute respiratory failure with hypoxia (HCC)   Protein-calorie malnutrition, severe   Likely aspiration pneumonia-change empiric abx antibiotics to Unasyn. Speech rec soft diet thin liquids. Continue IV saline. Have discontinued doxycycline from PTA-wbc 21-->14->16.  Narrow abx in 24-48 hrs depending on progress Severe COPD-given Solu-Medrol 125, continue 60 every 6 for now and transition to PO prednisone in 24 hours continued albuterol with DuoNeb every 2 when necessary Fl, Dulera 2 puffs twice a day Vtach 13 beats-Magnesium 1.9.  Started Metoprolol12.5 bid-monitor on tele Chronic diastolic heart failure-not on any diuretics as outpatient. Might benefit from hydralazine and nitrate. Hypovolemic hyponatremia-Sodium low.  Start saline 75  cc/h-d/c saline 3/17--needs diet-suspect chronic componenet Spinocerebellar ataxia type VI-outpatient follow-up needed with neurologist-sounds like progressive symptoms and worsening over past 1-2 mo.  Less likely to be able to go home.  Therapy to evaluate.  Will forward to Dr. Jannifer Franklin his neurologist for review-no known cure so might end up needing institutionalization Mod-severe malnutrition-Has lost weigh ~ 7 lbs past 2 weeks.  Nutritionist to eval once Speech therapy reviews him   DVT prophylaxis: Lovenox Code Status: Full Family Communication: called wife and d/c her on phone 3/16--discussed in person Needs letter 3/18 Disposition Plan: inpatient   Consultants:   none  Procedures:     Antimicrobials:   vanc 3/15--3/16  levaquin 3/15--3/16  unasyn 3/16    Subjective:   hot flashes with cold sweats note 13 bts vtaxch Poor appetite No cp sputum ++ No fever   Objective: Vitals:   06/23/16 1941 06/23/16 2016 06/24/16 0518 06/24/16 0840  BP:  (!) 143/97 131/72   Pulse:  98 96   Resp:  20 (!) 24   Temp:   97.6 F (36.4 C)   TempSrc:   Oral   SpO2: 95% 90% 95% 92%  Weight:      Height:        Intake/Output Summary (Last 24 hours) at 06/24/16 1024 Last data filed at 06/24/16 0644  Gross per 24 hour  Intake              150 ml  Output              400 ml  Net             -250 ml   Filed Weights   06/22/16 2158  Weight: 43.5  kg (95 lb 14.4 oz)    Examination:  General exam: Appears calm and comfortable.  Frail--spitting up some in bowl at bedsdie Respiratory system: Clear to auscultation. Respiratory effort normal.  No rales rhonchi--mild wheeze Cardiovascular system: S1 & S2 heard, RRR. No JVD Gastrointestinal system: Abdomen is nondistended, soft Central nervous system: Alert and oriented. Mild tremor Extremities: Symmetric 5 x 5 power. Skin: No rashes, lesions or ulcers Psychiatry: Judgement and insight appear normal. Mood & affect appropriate.      Data Reviewed: I have personally reviewed following labs and imaging studies  CBC:  Recent Labs Lab 06/22/16 1706 06/23/16 0445 06/24/16 0547  WBC 21.6* 14.5* 16.2*  NEUTROABS 20.0* 13.6* 14.9*  HGB 11.9* 9.7* 10.4*  HCT 34.2* 28.0* 29.6*  MCV 91.2 89.7 91.9  PLT 379 335 875*   Basic Metabolic Panel:  Recent Labs Lab 06/22/16 1706 06/22/16 2157 06/23/16 0445 06/24/16 0547  NA 130*  --  128* 130*  K 2.9*  --  3.6 4.4  CL 92*  --  93* 91*  CO2 28  --  28 30  GLUCOSE 175*  --  128* 134*  BUN 10  --  10 13  CREATININE 0.85  --  0.66 0.73  CALCIUM 8.5*  --  7.8* 8.2*  MG  --  3.1*  --  1.9   GFR: Estimated Creatinine Clearance: 58.9 mL/min (by C-G formula based on SCr of 0.73 mg/dL). Liver Function Tests:  Recent Labs Lab 06/24/16 0547  AST 19  ALT 27  ALKPHOS 75  BILITOT 0.5  PROT 6.0*  ALBUMIN 2.4*   No results for input(s): LIPASE, AMYLASE in the last 168 hours. No results for input(s): AMMONIA in the last 168 hours. Coagulation Profile: No results for input(s): INR, PROTIME in the last 168 hours. Cardiac Enzymes:  Recent Labs Lab 06/22/16 1706  TROPONINI <0.03   BNP (last 3 results) No results for input(s): PROBNP in the last 8760 hours. HbA1C:  Recent Labs  06/22/16 2157  HGBA1C 6.2*   CBG:  Recent Labs Lab 06/23/16 0807 06/23/16 1138 06/23/16 1643 06/23/16 2301 06/24/16 0733  GLUCAP 141* 129* 141* 122* 134*   Lipid Profile: No results for input(s): CHOL, HDL, LDLCALC, TRIG, CHOLHDL, LDLDIRECT in the last 72 hours. Thyroid Function Tests: No results for input(s): TSH, T4TOTAL, FREET4, T3FREE, THYROIDAB in the last 72 hours. Anemia Panel: No results for input(s): VITAMINB12, FOLATE, FERRITIN, TIBC, IRON, RETICCTPCT in the last 72 hours. Urine analysis:    Component Value Date/Time   COLORURINE YELLOW 09/08/2007 0719   APPEARANCEUR CLEAR 09/08/2007 0719   LABSPEC 1.010 09/08/2007 0719   PHURINE 6.5 09/08/2007 0719    GLUCOSEU NEGATIVE 09/08/2007 0719   HGBUR SMALL (A) 09/08/2007 0719   BILIRUBINUR negative 07/14/2015 1655   BILIRUBINUR neg 09/28/2013 1333   KETONESUR negative 07/14/2015 1655   KETONESUR NEGATIVE 09/08/2007 0719   PROTEINUR negative 07/14/2015 1655   PROTEINUR neg 09/28/2013 1333   PROTEINUR NEGATIVE 09/08/2007 0719   UROBILINOGEN 0.2 07/14/2015 1655   UROBILINOGEN 0.2 09/08/2007 0719   NITRITE Negative 07/14/2015 1655   NITRITE neg 09/28/2013 1333   NITRITE NEGATIVE 09/08/2007 0719   LEUKOCYTESUR Negative 07/14/2015 1655   Sepsis Labs: @LABRCNTIP (procalcitonin:4,lacticidven:4)  )No results found for this or any previous visit (from the past 240 hour(s)).       Radiology Studies: Dg Chest 2 View  Result Date: 06/22/2016 CLINICAL DATA:  Recent pneumonia.  Shortness of breath. EXAM: CHEST  2 VIEW COMPARISON:  06/13/2016 FINDINGS: The heart size is normal. There is a small right pleural effusion. Lungs are hyperinflated and there are chronic coarsened interstitial markings compatible with COPD/emphysema. Right lung base airspace consolidation involving the anterior right lung base is identified compatible with pneumonia. IMPRESSION: 1. Persistent right lung base pneumonia. 2. Right pleural effusion 3. COPD/emphysema Electronically Signed   By: Kerby Moors M.D.   On: 06/22/2016 18:23   Dg Swallowing Func-speech Pathology  Result Date: 06/23/2016 Objective Swallowing Evaluation: Type of Study: MBS-Modified Barium Swallow Study Patient Details Name: MERRIC YOST MRN: [id number] Date of Birth: Dec 15, 1955 Today's Date: 06/23/2016 Time: SLP Start Time (ACUTE ONLY): 1415-SLP Stop Time (ACUTE ONLY): 1445 SLP Time Calculation (min) (ACUTE ONLY): 30 min Past Medical History: Past Medical History: Diagnosis Date . Abnormal MRI of head  . Allergic rhinitis  . Candidal esophagitis (HCC)   Dr. Benson Norway 10/2012 . COPD (chronic obstructive pulmonary disease) ([place])  . Dysarthria 02/16/2014 . Dysphagia,  pharyngoesophageal phase 06/15/2014 . 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 Osteoporosis 04/10/2009  L hip fracture; T score -4.2 in 2012.  Fosamax for one year. . Pneumonia  . Raynaud disease 06/15/2014  Bilateral hands . Spinocerebellar ataxia type 6 ([place]) 06/15/2014 . Testicular atrophy   Right Past Surgical History: Past Surgical History: Procedure Laterality Date . CATARACT EXTRACTION    BOTH EYES . COLONOSCOPY W/ POLYPECTOMY  12/07/2006  three polyps sigmoid.  [name]. Repeat 3 years. . COLONOSCOPY W/ POLYPECTOMY  09/24/2012  five sessile polyps removed.  Internal and external hemorrhoids. . egd  09/24/2012  hiatal hernia; candidal esophagitis, hematin in stomach; no active source of bleeding. Marland Kitchen EYE SURGERY  04/10/2014  B cataract surgery . INGUINAL HERNIA REPAIR    bilateral . NASAL FRACTURE SURGERY   . VASECTOMY   HPI: 61 yo male adm [facility] with congestion and recent treatment with ABX.  Pt found to have Right lower lobe pna.   [place] + for COPD, candida esophagitis, spinocerebellar ataxia, Raynaud's, tobacco use.  Pt previously has undergone MBS as OP in 2016 May that showed moderate oropharyngeal-cervical esopahgeal dysphagia.  Pt denies problems swallowing, admits to issues with cough x 2 weeks with weakness and poor mobility, weight loss.  Current weight is 97 pounds and pt reports normal weight is 104.  Pt tx with ABX in coummunity but did not improve respiratory issues.  Subjective: pt wake in chair, family present *(wife and son) Assessment / Plan / Recommendation CHL IP CLINICAL IMPRESSIONS 06/23/2016 Clinical Impression Pt presents with findings largely comparative to prior MBS in May 2016.  Decreased oral control again allows premature spillage of barium into pharynx and mild oral residuals.   Pt sensed oral residuals of solids but not liquids and mild intermittent liquid residue spills into pharynx without consistent reflexive swallow.  Barium tablet swallowed with thin lodged at vallecular space and reflexive dry swallow  transited it into pharynx. Decreased tongue base retraction/epiglottic deflection allows vallecular residuals.  Dry swallows and liquids faciliate clearance.  NO aspiration or deep laryngeal penetration observed.  Pt today was unable to perform lingual propulsion to clear vallecular residuals *as he conducted on MBS two years prior.  Encourged him to strengthen his swallow, expectoration ability for maximal airway protection.  Recommend continue diet as tolerated with compensation strategies.   SLP will follow up while pt in house to maximize swallow rehab  and airway protection.  Educated pt and family using live video and teach back to findings.  SLP Visit Diagnosis Dysphagia, oropharyngeal  phase (R13.12) Attention and concentration deficit following -- Frontal lobe and executive function deficit following -- Impact on safety and function Moderate aspiration risk   CHL IP TREATMENT RECOMMENDATION 06/23/2016 Treatment Recommendations Therapy as outlined in treatment plan below   Prognosis 06/23/2016 Prognosis for Safe Diet Advancement Good Barriers to Reach Goals -- Barriers/Prognosis Comment -- CHL IP DIET RECOMMENDATION 06/23/2016 SLP Diet Recommendations Regular solids;Thin liquid Liquid Administration via Cup;No straw Medication Administration (No Data) Compensations Slow rate;Small sips/bites;Multiple dry swallows after each bite/sip;Follow solids with liquid;Other (Comment) Postural Changes Seated upright at 90 degrees;Remain semi-upright after after feeds/meals (Comment)   CHL IP OTHER RECOMMENDATIONS 06/23/2016 Recommended Consults -- Oral Care Recommendations Oral care BID Other Recommendations --   CHL IP FOLLOW UP RECOMMENDATIONS 06/23/2016 Follow up Recommendations Home health SLP   CHL IP FREQUENCY AND DURATION 06/23/2016 Speech Therapy Frequency (ACUTE ONLY) min 1 x/week Treatment Duration 1 week      CHL IP ORAL PHASE 06/23/2016 Oral Phase Impaired Oral - Pudding Teaspoon -- Oral - Pudding Cup -- Oral -  Honey Teaspoon -- Oral - Honey Cup -- Oral - Nectar Teaspoon -- Oral - Nectar Cup Weak lingual manipulation;Reduced posterior propulsion;Piecemeal swallowing;Decreased bolus cohesion Oral - Nectar Straw -- Oral - Thin Teaspoon -- Oral - Thin Cup Weak lingual manipulation;Reduced posterior propulsion;Piecemeal swallowing;Decreased bolus cohesion Oral - Thin Straw Weak lingual manipulation;Piecemeal swallowing;Decreased bolus cohesion Oral - Puree Weak lingual manipulation;Lingual/palatal residue;Piecemeal swallowing Oral - Mech Soft -- Oral - Regular Weak lingual manipulation;Delayed oral transit;Premature spillage;Lingual/palatal residue;Piecemeal swallowing Oral - Multi-Consistency Weak lingual manipulation;Reduced posterior propulsion;Premature spillage;Piecemeal swallowing Oral - Pill Weak lingual manipulation;Premature spillage;Piecemeal swallowing;Reduced posterior propulsion Oral Phase - Comment oral residuals spill into pharynx poorly controlled, pt does not consistently reflexively swallow with pharyngeal residuals  CHL IP PHARYNGEAL PHASE 06/23/2016 Pharyngeal Phase Impaired Pharyngeal- Pudding Teaspoon -- Pharyngeal -- Pharyngeal- Pudding Cup -- Pharyngeal -- Pharyngeal- Honey Teaspoon -- Pharyngeal -- Pharyngeal- Honey Cup -- Pharyngeal -- Pharyngeal- Nectar Teaspoon -- Pharyngeal -- Pharyngeal- Nectar Cup Pharyngeal residue - valleculae;Reduced epiglottic inversion;Reduced tongue base retraction Pharyngeal -- Pharyngeal- Nectar Straw -- Pharyngeal -- Pharyngeal- Thin Teaspoon -- Pharyngeal -- Pharyngeal- Thin Cup Penetration/Aspiration during swallow;Reduced epiglottic inversion;Reduced tongue base retraction Pharyngeal Material enters airway, remains ABOVE vocal cords then ejected out Pharyngeal- Thin Straw Reduced epiglottic inversion;Reduced tongue base retraction Pharyngeal -- Pharyngeal- Puree Reduced epiglottic inversion;Reduced tongue base retraction Pharyngeal -- Pharyngeal- Mechanical Soft --  Pharyngeal -- Pharyngeal- Regular Reduced epiglottic inversion;Reduced tongue base retraction Pharyngeal -- Pharyngeal- Multi-consistency Reduced epiglottic inversion;Reduced tongue base retraction Pharyngeal -- Pharyngeal- Pill Reduced epiglottic inversion;Reduced tongue base retraction Pharyngeal -- Pharyngeal Comment --  CHL IP CERVICAL ESOPHAGEAL PHASE 06/23/2016 Cervical Esophageal Phase (No Data) Pudding Teaspoon -- Pudding Cup -- Honey Teaspoon -- Honey Cup -- Nectar Teaspoon -- Nectar Cup -- Nectar Straw -- Thin Teaspoon -- Thin Cup -- Thin Straw -- Puree -- Mechanical Soft -- Regular -- Multi-consistency -- Pill -- Cervical Esophageal Comment -- Luanna Salk, MS [facility] SLP [phone]                   Scheduled Meds: . ampicillin-sulbactam (UNASYN) IV  1.5 g Intravenous Q8H  . chlorhexidine  15 mL Mouth Rinse BID  . dextromethorphan-guaiFENesin  1 tablet Oral Daily  . enoxaparin (LOVENOX) injection  40 mg Subcutaneous Q24H  . feeding supplement (ENSURE ENLIVE)  237 mL Oral Q24H  . insulin aspart  0-5 Units Subcutaneous QHS  . insulin aspart  0-9 Units Subcutaneous TID WC  .  lactose free nutrition  237 mL Oral Q24H  . mouth rinse  15 mL Mouth Rinse q12n4p  . methylPREDNISolone (SOLU-MEDROL) injection  60 mg Intravenous Q6H  . metoprolol tartrate  12.5 mg Oral BID  . mometasone-formoterol  2 puff Inhalation BID  . sodium chloride flush  3 mL Intravenous Q12H   Continuous Infusions:   LOS: 2 days    Time spent: [place], MD [facility] ([phone]   If 7PM-7AM, please contact night-coverage [email or web address] Password [facility] 06/24/2016, 10:24 AM

## 2016-06-25 LAB — GLUCOSE, CAPILLARY
GLUCOSE-CAPILLARY: 138 mg/dL — AB (ref 65–99)
GLUCOSE-CAPILLARY: 84 mg/dL (ref 65–99)
Glucose-Capillary: 175 mg/dL — ABNORMAL HIGH (ref 65–99)
Glucose-Capillary: 106 mg/dL — ABNORMAL HIGH (ref 65–99)

## 2016-06-25 LAB — CBC
HEMATOCRIT: 29.9 % — AB (ref 39.0–52.0)
HEMOGLOBIN: 10.4 g/dL — AB (ref 13.0–17.0)
MCH: 32.2 pg (ref 26.0–34.0)
MCHC: 34.8 g/dL (ref 30.0–36.0)
MCV: 92.6 fL (ref 78.0–100.0)
Platelets: 411 10*3/uL — ABNORMAL HIGH (ref 150–400)
RBC: 3.23 MIL/uL — ABNORMAL LOW (ref 4.22–5.81)
RDW: 13.7 % (ref 11.5–15.5)
WBC: 9.7 10*3/uL (ref 4.0–10.5)

## 2016-06-25 MED ORDER — PREDNISONE 20 MG PO TABS
40.0000 mg | ORAL_TABLET | Freq: Every day | ORAL | Status: DC
  Administered 2016-06-25 – 2016-06-26 (×2): 40 mg via ORAL
  Filled 2016-06-25 (×2): qty 2

## 2016-06-25 MED ORDER — AMOXICILLIN-POT CLAVULANATE 875-125 MG PO TABS
1.0000 | ORAL_TABLET | Freq: Two times a day (BID) | ORAL | Status: DC
  Administered 2016-06-25 – 2016-06-26 (×3): 1 via ORAL
  Filled 2016-06-25 (×3): qty 1

## 2016-06-25 MED ORDER — LORAZEPAM 0.5 MG PO TABS
0.5000 mg | ORAL_TABLET | Freq: Two times a day (BID) | ORAL | Status: DC
  Administered 2016-06-25 – 2016-06-26 (×3): 0.5 mg via ORAL
  Filled 2016-06-25 (×3): qty 1

## 2016-06-25 MED ORDER — ACETAMINOPHEN 325 MG PO TABS
650.0000 mg | ORAL_TABLET | Freq: Four times a day (QID) | ORAL | Status: DC | PRN
  Administered 2016-06-25: 650 mg via ORAL
  Filled 2016-06-25: qty 2

## 2016-06-25 NOTE — Progress Notes (Signed)
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 And right basilar pneumonia-initial white count 21 Found also hypokalemia 2.9, sodium 130 Potassium replaced, placed on Solu-Medrol in the emergency room   Assessment & Plan:   Principal Problem:   Pneumonia Active Problems:   COPD exacerbation ([place])   Anemia   Hyponatremia   Chronic combined systolic and diastolic CHF (congestive heart failure) (HCC)   Hypokalemia   Acute respiratory failure with hypoxia (HCC)   Protein-calorie malnutrition, severe   Likely aspiration pneumonia-change empiric abx antibiotics to Unasyn. Speech rec soft diet thin liquids. Continue IV saline. Have discontinued doxycycline from PTA-wbc 21-->14->16.  Narrow abx to Augmentin 3.18 and reassess am Severe COPD-given Solu-Medrol 125, continue 60 every 6 for now and transition to PO prednisone in 24 hours continued albuterol with DuoNeb every 2 when necessary Fl, Dulera 2 puffs twice a day Vtach 13 beats-Magnesium 1.9.  Started Metoprolol12.5 bid-monitor on tele--no issues-d/c tele am Chronic diastolic heart failure-not on any diuretics as outpatient. Might benefit from hydralazine and nitrate. Hypovolemic hyponatremia-Sodium  low.  Start saline 75 cc/h-d/c saline 3/17--needs diet-suspect chronic component Spinocerebellar ataxia type VI-outpatient follow-up needed with neurologist-sounds like progressive symptoms and worsening over past 1-2 mo.  PT recs HH PT.  Will forward to Dr. Jannifer Franklin his neurologist for review-no known cure so might end up needing institutionalization Mod-severe malnutrition-Has lost weigh ~ 7 lbs past 2 weeks.  Nutritionist to eval once Speech therapy reviews him   DVT prophylaxis: Lovenox Code Status: Full Family Communication: d/w wife 3/18 Disposition Plan: inpatient   Consultants:   none  Procedures:     Antimicrobials:   vanc 3/15--3/16  levaquin 3/15--3/16  unasyn 3/16 -->3/18  Augnmentin 3/18   Subjective:  More anxious overnight.  No new issues with arrythmias A little laboured this am--neb didn't help much-some R sided CP  Objective: Vitals:   06/24/16 1954 06/24/16 2128 06/25/16 0529 06/25/16 0834  BP:  110/70 111/67   Pulse:  91 81   Resp:  18 20   Temp:  98 F (36.7 C) 97.8 F (36.6 C)   TempSrc:  Axillary Oral   SpO2: 95% 96% 95% 94%  Weight:      Height:        Intake/Output Summary (Last 24 hours) at 06/25/16 0913 Last data filed at 06/25/16 0529  Gross per 24 hour  Intake              150 ml  Output              800 ml  Net             -650 ml   Filed Weights   06/22/16 2158  Weight: 43.5 kg (95 lb 14.4 oz)    Examination:  General exam: Appears calm and comfortable.  Ordering breakfast--some what nervous Respiratory system: Clear to auscultation. Respiratory effort normal.  No rales rhonchi--mild wheeze Cardiovascular system: S1 & S2 heard, RRR. No JVD Gastrointestinal system: Abdomen is nondistended, soft Central nervous system: Alert and oriented. Mild tremor Extremities: Symmetric 5 x 5 power. Skin: No rashes, lesions or ulcers Psychiatry: Judgement and insight appear normal. Mood & affect appropriate.     Data Reviewed: I  have personally reviewed following labs and imaging studies  CBC:  Recent Labs Lab 06/22/16 1706 06/23/16 0445 06/24/16 0547  WBC 21.6* 14.5* 16.2*  NEUTROABS 20.0* 13.6* 14.9*  HGB 11.9* 9.7* 10.4*  HCT 34.2* 28.0* 29.6*  MCV 91.2 89.7 91.9  PLT 379 335 696*   Basic Metabolic Panel:  Recent Labs Lab 06/22/16 1706 06/22/16 2157 06/23/16 0445 06/24/16 0547  NA 130*  --  128* 130*  K 2.9*  --  3.6 4.4  CL 92*  --  93* 91*  CO2 28  --  28 30  GLUCOSE 175*  --  128* 134*  BUN 10  --  10 13  CREATININE 0.85  --  0.66 0.73  CALCIUM 8.5*  --  7.8* 8.2*  MG  --  3.1*  --  1.9   GFR: Estimated Creatinine Clearance: 58.9 mL/min (by C-G formula based on SCr of 0.73 mg/dL). Liver Function Tests:  Recent Labs Lab 06/24/16 0547  AST 19  ALT 27  ALKPHOS 75  BILITOT 0.5  PROT 6.0*  ALBUMIN 2.4*   No results for input(s): LIPASE, AMYLASE in the last 168 hours. No results for input(s): AMMONIA in the last 168 hours. Coagulation Profile: No results for input(s): INR, PROTIME in the last 168 hours. Cardiac Enzymes:  Recent Labs Lab 06/22/16 1706  TROPONINI <0.03   BNP (last 3 results) No results for input(s): PROBNP in the last 8760 hours. HbA1C:  Recent Labs  06/22/16 2157  HGBA1C 6.2*   CBG:  Recent Labs Lab 06/24/16 0733 06/24/16 1120 06/24/16 1820 06/24/16 2128 06/25/16 0723  GLUCAP 134* 116* 143* 129* 138*   Lipid Profile: No results for input(s): CHOL, HDL, LDLCALC, TRIG, CHOLHDL, LDLDIRECT in the last 72 hours. Thyroid Function Tests: No results for input(s): TSH, T4TOTAL, FREET4, T3FREE, THYROIDAB in the last 72 hours. Anemia Panel: No results for input(s): VITAMINB12, FOLATE, FERRITIN, TIBC, IRON, RETICCTPCT in the last 72 hours. Urine analysis:    Component Value Date/Time   COLORURINE YELLOW 09/08/2007 0719   APPEARANCEUR CLEAR 09/08/2007 0719   LABSPEC 1.010 09/08/2007 0719   PHURINE 6.5 09/08/2007 0719   GLUCOSEU NEGATIVE 09/08/2007  0719   HGBUR SMALL (A) 09/08/2007 0719   BILIRUBINUR negative 07/14/2015 1655   BILIRUBINUR neg 09/28/2013 1333   KETONESUR negative 07/14/2015 1655   KETONESUR NEGATIVE 09/08/2007 0719   PROTEINUR negative 07/14/2015 1655   PROTEINUR neg 09/28/2013 1333   PROTEINUR NEGATIVE 09/08/2007 0719   UROBILINOGEN 0.2 07/14/2015 1655   UROBILINOGEN 0.2 09/08/2007 0719   NITRITE Negative 07/14/2015 1655   NITRITE neg 09/28/2013 1333   NITRITE NEGATIVE 09/08/2007 0719   LEUKOCYTESUR Negative 07/14/2015 1655   Sepsis Labs: @LABRCNTIP (procalcitonin:4,lacticidven:4)  )No results found for this or any previous visit (from the past 240 hour(s)).       Radiology Studies: Dg Swallowing Func-speech Pathology  Result Date: 06/23/2016 Objective Swallowing Evaluation: Type of Study: MBS-Modified Barium Swallow Study Patient Details Name: Warren [name] MRN: [id number] Date of Birth: 02/09/1956 Today's Date: 06/23/2016 Time: SLP Start  Time (ACUTE ONLY): 1415-SLP Stop Time (ACUTE ONLY): 1445 SLP Time Calculation (min) (ACUTE ONLY): 30 min Past Medical History: Past Medical History: Diagnosis Date . Abnormal MRI of head  . Allergic rhinitis  . Candidal esophagitis (HCC)   Dr. Benson Norway 10/2012 . COPD (chronic obstructive pulmonary disease) ([place])  . Dysarthria 02/16/2014 . Dysphagia, pharyngoesophageal phase 06/15/2014 . 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 Osteoporosis 04/10/2009  L hip fracture; T score -4.2 in 2012.  Fosamax for one year. . Pneumonia  . Raynaud disease 06/15/2014  Bilateral hands . Spinocerebellar ataxia type 6 ([place]) 06/15/2014 . Testicular atrophy   Right Past Surgical History: Past Surgical History: Procedure Laterality Date . CATARACT EXTRACTION    BOTH EYES . COLONOSCOPY W/ POLYPECTOMY  12/07/2006  three polyps sigmoid.  [name]. Repeat 3 years. . COLONOSCOPY W/ POLYPECTOMY  09/24/2012  five sessile polyps removed.  Internal and external hemorrhoids. . egd  09/24/2012  hiatal hernia; candidal esophagitis, hematin in  stomach; no active source of bleeding. Marland Kitchen EYE SURGERY  04/10/2014  B cataract surgery . INGUINAL HERNIA REPAIR    bilateral . NASAL FRACTURE SURGERY   . VASECTOMY   HPI: 61 yo male adm [facility] with congestion and recent treatment with ABX.  Pt found to have Right lower lobe pna.   [place] + for COPD, candida esophagitis, spinocerebellar ataxia, Raynaud's, tobacco use.  Pt previously has undergone MBS as OP in 2016 May that showed moderate oropharyngeal-cervical esopahgeal dysphagia.  Pt denies problems swallowing, admits to issues with cough x 2 weeks with weakness and poor mobility, weight loss.  Current weight is 97 pounds and pt reports normal weight is 104.  Pt tx with ABX in coummunity but did not improve respiratory issues.  Subjective: pt wake in chair, family present *(wife and son) Assessment / Plan / Recommendation CHL IP CLINICAL IMPRESSIONS 06/23/2016 Clinical Impression Pt presents with findings largely comparative to prior MBS in May 2016.  Decreased oral control again allows premature spillage of barium into pharynx and mild oral residuals.   Pt sensed oral residuals of solids but not liquids and mild intermittent liquid residue spills into pharynx without consistent reflexive swallow.  Barium tablet swallowed with thin lodged at vallecular space and reflexive dry swallow transited it into pharynx. Decreased tongue base retraction/epiglottic deflection allows vallecular residuals.  Dry swallows and liquids faciliate clearance.  NO aspiration or deep laryngeal penetration observed.  Pt today was unable to perform lingual propulsion to clear vallecular residuals *as he conducted on MBS two years prior.  Encourged him to strengthen his swallow, expectoration ability for maximal airway protection.  Recommend continue diet as tolerated with compensation strategies.   SLP will follow up while pt in house to maximize swallow rehab  and airway protection.  Educated pt and family using live video and teach back to  findings.  SLP Visit Diagnosis Dysphagia, oropharyngeal phase (R13.12) Attention and concentration deficit following -- Frontal lobe and executive function deficit following -- Impact on safety and function Moderate aspiration risk   CHL IP TREATMENT RECOMMENDATION 06/23/2016 Treatment Recommendations Therapy as outlined in treatment plan below   Prognosis 06/23/2016 Prognosis for Safe Diet Advancement Good Barriers to Reach Goals -- Barriers/Prognosis Comment -- CHL IP DIET RECOMMENDATION 06/23/2016 SLP Diet Recommendations Regular solids;Thin liquid Liquid Administration via Cup;No straw Medication Administration (No Data) Compensations Slow rate;Small sips/bites;Multiple dry swallows after each bite/sip;Follow solids with liquid;Other (Comment) Postural Changes Seated upright at 90 degrees;Remain semi-upright after after feeds/meals (Comment)   CHL IP OTHER  RECOMMENDATIONS 06/23/2016 Recommended Consults -- Oral Care Recommendations Oral care BID Other Recommendations --   CHL IP FOLLOW UP RECOMMENDATIONS 06/23/2016 Follow up Recommendations Home health SLP   CHL IP FREQUENCY AND DURATION 06/23/2016 Speech Therapy Frequency (ACUTE ONLY) min 1 x/week Treatment Duration 1 week      CHL IP ORAL PHASE 06/23/2016 Oral Phase Impaired Oral - Pudding Teaspoon -- Oral - Pudding Cup -- Oral - Honey Teaspoon -- Oral - Honey Cup -- Oral - Nectar Teaspoon -- Oral - Nectar Cup Weak lingual manipulation;Reduced posterior propulsion;Piecemeal swallowing;Decreased bolus cohesion Oral - Nectar Straw -- Oral - Thin Teaspoon -- Oral - Thin Cup Weak lingual manipulation;Reduced posterior propulsion;Piecemeal swallowing;Decreased bolus cohesion Oral - Thin Straw Weak lingual manipulation;Piecemeal swallowing;Decreased bolus cohesion Oral - Puree Weak lingual manipulation;Lingual/palatal residue;Piecemeal swallowing Oral - Mech Soft -- Oral - Regular Weak lingual manipulation;Delayed oral transit;Premature spillage;Lingual/palatal  residue;Piecemeal swallowing Oral - Multi-Consistency Weak lingual manipulation;Reduced posterior propulsion;Premature spillage;Piecemeal swallowing Oral - Pill Weak lingual manipulation;Premature spillage;Piecemeal swallowing;Reduced posterior propulsion Oral Phase - Comment oral residuals spill into pharynx poorly controlled, pt does not consistently reflexively swallow with pharyngeal residuals  CHL IP PHARYNGEAL PHASE 06/23/2016 Pharyngeal Phase Impaired Pharyngeal- Pudding Teaspoon -- Pharyngeal -- Pharyngeal- Pudding Cup -- Pharyngeal -- Pharyngeal- Honey Teaspoon -- Pharyngeal -- Pharyngeal- Honey Cup -- Pharyngeal -- Pharyngeal- Nectar Teaspoon -- Pharyngeal -- Pharyngeal- Nectar Cup Pharyngeal residue - valleculae;Reduced epiglottic inversion;Reduced tongue base retraction Pharyngeal -- Pharyngeal- Nectar Straw -- Pharyngeal -- Pharyngeal- Thin Teaspoon -- Pharyngeal -- Pharyngeal- Thin Cup Penetration/Aspiration during swallow;Reduced epiglottic inversion;Reduced tongue base retraction Pharyngeal Material enters airway, remains ABOVE vocal cords then ejected out Pharyngeal- Thin Straw Reduced epiglottic inversion;Reduced tongue base retraction Pharyngeal -- Pharyngeal- Puree Reduced epiglottic inversion;Reduced tongue base retraction Pharyngeal -- Pharyngeal- Mechanical Soft -- Pharyngeal -- Pharyngeal- Regular Reduced epiglottic inversion;Reduced tongue base retraction Pharyngeal -- Pharyngeal- Multi-consistency Reduced epiglottic inversion;Reduced tongue base retraction Pharyngeal -- Pharyngeal- Pill Reduced epiglottic inversion;Reduced tongue base retraction Pharyngeal -- Pharyngeal Comment --  CHL IP CERVICAL ESOPHAGEAL PHASE 06/23/2016 Cervical Esophageal Phase (No Data) Pudding Teaspoon -- Pudding Cup -- Honey Teaspoon -- Honey Cup -- Nectar Teaspoon -- Nectar Cup -- Nectar Straw -- Thin Teaspoon -- Thin Cup -- Thin Straw -- Puree -- Mechanical Soft -- Regular -- Multi-consistency -- Pill -- Cervical  Esophageal Comment -- Luanna Salk, MS [facility] SLP [phone]                   Scheduled Meds: . amoxicillin-clavulanate  1 tablet Oral Q12H  . chlorhexidine  15 mL Mouth Rinse BID  . dextromethorphan-guaiFENesin  1 tablet Oral Daily  . enoxaparin (LOVENOX) injection  40 mg Subcutaneous Q24H  . feeding supplement (ENSURE ENLIVE)  237 mL Oral Q24H  . insulin aspart  0-5 Units Subcutaneous QHS  . insulin aspart  0-9 Units Subcutaneous TID WC  . lactose free nutrition  237 mL Oral Q24H  . mouth rinse  15 mL Mouth Rinse q12n4p  . metoprolol tartrate  12.5 mg Oral BID  . mometasone-formoterol  2 puff Inhalation BID  . predniSONE  40 mg Oral QAC breakfast   Continuous Infusions:   LOS: 3 days    Time spent: [place], MD [facility] ([facility]   If 7PM-7AM, please contact night-coverage [email or web address] Password [facility] 06/25/2016, 9:13 AM

## 2016-06-26 ENCOUNTER — Encounter: Payer: Self-pay | Admitting: Family Medicine

## 2016-06-26 LAB — GLUCOSE, CAPILLARY
Glucose-Capillary: 87 mg/dL (ref 65–99)
Glucose-Capillary: 142 mg/dL — ABNORMAL HIGH (ref 65–99)

## 2016-06-26 MED ORDER — METOPROLOL SUCCINATE ER 25 MG PO TB24: 12.5000 mg | ORAL_TABLET | Freq: Every day | ORAL | 0 refills | Status: AC

## 2016-06-26 MED ORDER — LORAZEPAM 0.5 MG PO TABS: 0.5000 mg | ORAL_TABLET | Freq: Two times a day (BID) | ORAL | 0 refills | Status: DC

## 2016-06-26 MED ORDER — PREDNISONE 20 MG PO TABS: 40.0000 mg | ORAL_TABLET | Freq: Every day | ORAL | 0 refills | Status: DC

## 2016-06-26 MED ORDER — AMOXICILLIN-POT CLAVULANATE 875-125 MG PO TABS: 1.0000 | ORAL_TABLET | Freq: Two times a day (BID) | ORAL | 0 refills | Status: DC

## 2016-06-26 MED ORDER — CARVEDILOL PHOSPHATE ER 10 MG PO CP24: 10.0000 mg | ORAL_CAPSULE | Freq: Every day | ORAL | Status: DC

## 2016-06-26 MED ORDER — CARVEDILOL 3.125 MG PO TABS
3.1250 mg | ORAL_TABLET | Freq: Two times a day (BID) | ORAL | Status: DC
  Administered 2016-06-26: 3.125 mg via ORAL
  Filled 2016-06-26: qty 1

## 2016-06-26 NOTE — Discharge Summary (Signed)
Physician Discharge Summary  Warren [name] BZJ:[id number] DOB: 04-23-1955 DOA: 06/22/2016  PCP: No PCP Per Patient  Admit date: 06/22/2016 Discharge date: 06/26/2016  Time spent: 30 minutes  Recommendations for Outpatient Follow-up:  1. Complete burst of steroids as dictated in prescription given 2. Start Toprol-XL 12 with 5 4 SVT run-needs 9 needs lab 1 weeks 3. Recommend repeat chest x-ray in about 4 weeks 4. Complete course of Augmentin for pneumonia   Discharge Diagnoses:  Principal Problem:   Pneumonia Active Problems:   COPD exacerbation ([place])   Anemia   Hyponatremia   Chronic combined systolic and diastolic CHF (congestive heart failure) (HCC)   Hypokalemia   Acute respiratory failure with hypoxia (HCC)   Protein-calorie malnutrition, severe   Discharge Condition:  Improved  Diet recommendation:  Recommend doing a regular solids and thin liquids with modifications  Filed Weights   06/22/16 2158  Weight: 43.5 kg (95 lb 14.4 oz)    History of present illness:  61 year old male Severe COPD -PFT's reviewed >FEV1 was 1.25L (42% predicted) in 01/2011 Tobacco abuse 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 And right basilar pneumonia-initial white count 21 Found also hypokalemia 2.9, sodium 130 Potassium replaced, placed on Solu-Medrol in the emergency room  Hospital Course:  Likely aspiration pneumonia-change empiric abx antibiotics to Unasyn. Speech rec soft diet thin  liquids. Continue IV saline. Have discontinued doxycycline from PTA-wbc 21-->14->16.  Narrow abx to Augmentin 3.18 and he is now ration Severe COPD-given Solu-Medrol 125, continue 60 every 6 for now and transition to PO prednisone and given an extended burst of prednisone on discharge . continued albuterol with DuoNeb every 2 when necessary Fl, Dulera 2 puffs twice a day he was started on home oxygen this admission and will need this at least going forward Vtach 13 beats-Magnesium 1.9.  Started Metoprolol12.5 bid-monitor on tele--no issues-d/c tele am--had some hypotension so was changed to XL version and will need outpatient Chronic diastolic heart failure-not on any diuretics as outpatient. Might benefit from hydralazine and nitrate. Hypovolemic hyponatremia-Sodium low.  Start saline 75 cc/h-d/c saline 3/17--needs diet-suspect chronic component Spinocerebellar ataxia type VI-outpatient follow-up needed with neurologist-sounds like progressive symptoms and worsening over past 1-2 mo.  PT recs HH PT.  Will forward to Dr. Jannifer Franklin his neurologist for family aware and does need follow-up Mod-severe malnutrition-Has lost weigh ~ 7 lbs past 2 weeks.  Nutritionist to eval Anxiety-given limited ascription of Ativan told that he should follow up with his primary care physician as an outpatient-does not have one and we will ask case manager to assist with finding 1   Discharge Exam: Vitals:   06/26/16 0626 06/26/16 0911  BP: 95/60 104/76  Pulse: 94 94  Resp: 20   Temp: 98.5 F (36.9 C)     General: Alert pleasant oriented no apparent distress Cardiovascular: S1-S2 no murmur rub or gallop Respiratory: Clinically clear with mild wheeze  Discharge Instructions   Discharge Instructions    Diet - low  sodium heart healthy    Complete by:  As directed    For home use only DME oxygen    Complete by:  As directed    Mode or (Route):  Nasal cannula   Liters per Minute:  2   Frequency:  Continuous  (stationary and portable oxygen unit needed)   Oxygen conserving device:  No   Oxygen delivery system:  Gas   Increase activity slowly    Complete by:  As directed      Current Discharge Medication List    START taking these medications   Details  amoxicillin-clavulanate (AUGMENTIN) 875-125 MG tablet Take 1 tablet by mouth every 12 (twelve) hours. Qty: 4 tablet, Refills: 0    LORazepam (ATIVAN) 0.5 MG tablet Take 1 tablet (0.5 mg total) by mouth 2 (two) times daily. Qty: 20 tablet, Refills: 0    metoprolol succinate (TOPROL XL) 25 MG 24 hr tablet Take 0.5 tablets (12.5 mg total) by mouth daily. Qty: 30 tablet, Refills: 0      CONTINUE these medications which have CHANGED   Details  predniSONE (DELTASONE) 20 MG tablet Take 2 tablets (40 mg total) by mouth daily before breakfast. Qty: 14 tablet, Refills: 0      CONTINUE these medications which have NOT CHANGED   Details  albuterol (PROVENTIL) (2.5 MG/3ML) 0.083% nebulizer solution Take 3 mLs (2.5 mg total) by nebulization every 6 (six) hours as needed for wheezing or shortness of breath. Qty: 360 mL, Refills: 12   Associated Diagnoses: Chronic obstructive pulmonary disease, unspecified copd, unspecified chronic bronchitis type    budesonide-formoterol (SYMBICORT) 160-4.5 MCG/ACT inhaler Inhale 2 puffs into the lungs 2 (two) times daily. Qty: 1 Inhaler, Refills: 6    dextromethorphan-guaiFENesin (MUCINEX DM) 30-600 MG 12hr tablet Take 1 tablet by mouth daily.    fluticasone (FLONASE) 50 MCG/ACT nasal spray Place into both nostrils daily as needed for allergies or rhinitis (for allergies).    ibuprofen (ADVIL,MOTRIN) 200 MG tablet Take 400 mg by mouth every 6 (six) hours as needed for mild pain or moderate pain.    ipratropium (ATROVENT) 0.02 % nebulizer solution Take 2.5 mLs (0.5 mg total) by nebulization 4 (four) times daily. Qty: 360 mL, Refills: 12    Spacer/Aero-Holding Chambers (E-Z SPACER) inhaler Use as  instructed Qty: 1 each, Refills: 2   Associated Diagnoses: Cough; SOB (shortness of breath)    albuterol (PROVENTIL HFA;VENTOLIN HFA) 108 (90 Base) MCG/ACT inhaler Inhale 2 puffs into the lungs as needed for wheezing or shortness of breath. Qty: 1 Inhaler, Refills: 2      STOP taking these medications     doxycycline (VIBRA-TABS) 100 MG tablet        Allergies  Allergen Reactions  . Contrast Media [Iodinated Diagnostic Agents] Hives and Itching    Happened 40 years ago  . Iodine Hives      The results of significant diagnostics from this hospitalization (including imaging, microbiology, ancillary and laboratory) are listed below for reference.    Significant Diagnostic Studies: Dg Chest 2 View  Result Date: 06/22/2016 CLINICAL DATA:  Recent pneumonia.  Shortness of breath. EXAM: CHEST  2 VIEW COMPARISON:  06/13/2016 FINDINGS: The heart size is normal. There is a small right pleural effusion. Lungs are hyperinflated and there are chronic coarsened interstitial markings compatible with COPD/emphysema. Right lung base airspace consolidation involving the anterior right lung base is identified compatible with pneumonia. IMPRESSION: 1. Persistent right lung base pneumonia. 2. Right pleural effusion 3. COPD/emphysema  Electronically Signed   By: Kerby Moors M.D.   On: 06/22/2016 18:23   Dg Chest 2 View  Result Date: 06/13/2016 CLINICAL DATA:  Cough, wheezing, dyspnea, and chest congestion for 4 days. History of pneumonia, COPD, former smoker. EXAM: CHEST  2 VIEW COMPARISON:  PA and lateral chest x-ray of July 14, 2015 FINDINGS: The lungs are hyperinflated with hemidiaphragm flattening and increased AP dimension of the thorax. There is new confluent interstitial infiltrate in the inferior aspect of the right lower lobe. There is a small right pleural effusion. The left lung is clear. The heart is normal in size. The central pulmonary vascularity is prominent but stable. There is stable  apical pleural thickening on the right. There is calcification in the wall of the thoracic aorta. There is 40% anterior wedge compression of T9 and and 10% anterior compression of T11 new since the previous study. IMPRESSION: New interstitial infiltrate in the right lower lobe most compatible with pneumonia. Followup PA and lateral chest X-ray is recommended in 3-4 weeks following trial of antibiotic therapy to ensure resolution and exclude underlying malignancy. No CHF.  Thoracic aortic atherosclerosis. Partial compressions of the bodies of T9 and T11 new since the April 2017 study. Electronically Signed   By: David  Martinique M.D.   On: 06/13/2016 15:41   Dg Swallowing Func-speech Pathology  Result Date: 06/23/2016 Objective Swallowing Evaluation: Type of Study: MBS-Modified Barium Swallow Study Patient Details Name: JOVANNY STEPHANIE MRN: [id number] Date of Birth: 02-24-1956 Today's Date: 06/23/2016 Time: SLP Start Time (ACUTE ONLY): 1415-SLP Stop Time (ACUTE ONLY): 1445 SLP Time Calculation (min) (ACUTE ONLY): 30 min Past Medical History: Past Medical History: Diagnosis Date . Abnormal MRI of head  . Allergic rhinitis  . Candidal esophagitis (HCC)   Dr. Benson Norway 10/2012 . COPD (chronic obstructive pulmonary disease) ([place])  . Dysarthria 02/16/2014 . Dysphagia, pharyngoesophageal phase 06/15/2014 . 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 Osteoporosis 04/10/2009  L hip fracture; T score -4.2 in 2012.  Fosamax for one year. . Pneumonia  . Raynaud disease 06/15/2014  Bilateral hands . Spinocerebellar ataxia type 6 ([place]) 06/15/2014 . Testicular atrophy   Right Past Surgical History: Past Surgical History: Procedure Laterality Date . CATARACT EXTRACTION    BOTH EYES . COLONOSCOPY W/ POLYPECTOMY  12/07/2006  three polyps sigmoid.  [name]. Repeat 3 years. . COLONOSCOPY W/ POLYPECTOMY  09/24/2012  five sessile polyps removed.  Internal and external hemorrhoids. . egd  09/24/2012  hiatal hernia; candidal esophagitis, hematin in stomach; no active source of  bleeding. Marland Kitchen EYE SURGERY  04/10/2014  B cataract surgery . INGUINAL HERNIA REPAIR    bilateral . NASAL FRACTURE SURGERY   . VASECTOMY   HPI: 61 yo male adm to [facility] with congestion and recent treatment with ABX.  Pt found to have Right lower lobe pna.   [place] + for COPD, candida esophagitis, spinocerebellar ataxia, Raynaud's, tobacco use.  Pt previously has undergone MBS as OP in 2016 May that showed moderate oropharyngeal-cervical esopahgeal dysphagia.  Pt denies problems swallowing, admits to issues with cough x 2 weeks with weakness and poor mobility, weight loss.  Current weight is 97 pounds and pt reports normal weight is 104.  Pt tx with ABX in coummunity but did not improve respiratory issues.  Subjective: pt wake in chair, family present *(wife and son) Assessment / Plan / Recommendation CHL IP CLINICAL IMPRESSIONS 06/23/2016 Clinical Impression Pt presents with findings largely comparative to prior MBS in May 2016.  Decreased oral control again allows  premature spillage of barium into pharynx and mild oral residuals.   Pt sensed oral residuals of solids but not liquids and mild intermittent liquid residue spills into pharynx without consistent reflexive swallow.  Barium tablet swallowed with thin lodged at vallecular space and reflexive dry swallow transited it into pharynx. Decreased tongue base retraction/epiglottic deflection allows vallecular residuals.  Dry swallows and liquids faciliate clearance.  NO aspiration or deep laryngeal penetration observed.  Pt today was unable to perform lingual propulsion to clear vallecular residuals *as he conducted on MBS two years prior.  Encourged him to strengthen his swallow, expectoration ability for maximal airway protection.  Recommend continue diet as tolerated with compensation strategies.   SLP will follow up while pt in house to maximize swallow rehab  and airway protection.  Educated pt and family using live video and teach back to findings.  SLP Visit Diagnosis  Dysphagia, oropharyngeal phase (R13.12) Attention and concentration deficit following -- Frontal lobe and executive function deficit following -- Impact on safety and function Moderate aspiration risk   CHL IP TREATMENT RECOMMENDATION 06/23/2016 Treatment Recommendations Therapy as outlined in treatment plan below   Prognosis 06/23/2016 Prognosis for Safe Diet Advancement Good Barriers to Reach Goals -- Barriers/Prognosis Comment -- CHL IP DIET RECOMMENDATION 06/23/2016 SLP Diet Recommendations Regular solids;Thin liquid Liquid Administration via Cup;No straw Medication Administration (No Data) Compensations Slow rate;Small sips/bites;Multiple dry swallows after each bite/sip;Follow solids with liquid;Other (Comment) Postural Changes Seated upright at 90 degrees;Remain semi-upright after after feeds/meals (Comment)   CHL IP OTHER RECOMMENDATIONS 06/23/2016 Recommended Consults -- Oral Care Recommendations Oral care BID Other Recommendations --   CHL IP FOLLOW UP RECOMMENDATIONS 06/23/2016 Follow up Recommendations Home health SLP   CHL IP FREQUENCY AND DURATION 06/23/2016 Speech Therapy Frequency (ACUTE ONLY) min 1 x/week Treatment Duration 1 week      CHL IP ORAL PHASE 06/23/2016 Oral Phase Impaired Oral - Pudding Teaspoon -- Oral - Pudding Cup -- Oral - Honey Teaspoon -- Oral - Honey Cup -- Oral - Nectar Teaspoon -- Oral - Nectar Cup Weak lingual manipulation;Reduced posterior propulsion;Piecemeal swallowing;Decreased bolus cohesion Oral - Nectar Straw -- Oral - Thin Teaspoon -- Oral - Thin Cup Weak lingual manipulation;Reduced posterior propulsion;Piecemeal swallowing;Decreased bolus cohesion Oral - Thin Straw Weak lingual manipulation;Piecemeal swallowing;Decreased bolus cohesion Oral - Puree Weak lingual manipulation;Lingual/palatal residue;Piecemeal swallowing Oral - Mech Soft -- Oral - Regular Weak lingual manipulation;Delayed oral transit;Premature spillage;Lingual/palatal residue;Piecemeal swallowing Oral -  Multi-Consistency Weak lingual manipulation;Reduced posterior propulsion;Premature spillage;Piecemeal swallowing Oral - Pill Weak lingual manipulation;Premature spillage;Piecemeal swallowing;Reduced posterior propulsion Oral Phase - Comment oral residuals spill into pharynx poorly controlled, pt does not consistently reflexively swallow with pharyngeal residuals  CHL IP PHARYNGEAL PHASE 06/23/2016 Pharyngeal Phase Impaired Pharyngeal- Pudding Teaspoon -- Pharyngeal -- Pharyngeal- Pudding Cup -- Pharyngeal -- Pharyngeal- Honey Teaspoon -- Pharyngeal -- Pharyngeal- Honey Cup -- Pharyngeal -- Pharyngeal- Nectar Teaspoon -- Pharyngeal -- Pharyngeal- Nectar Cup Pharyngeal residue - valleculae;Reduced epiglottic inversion;Reduced tongue base retraction Pharyngeal -- Pharyngeal- Nectar Straw -- Pharyngeal -- Pharyngeal- Thin Teaspoon -- Pharyngeal -- Pharyngeal- Thin Cup Penetration/Aspiration during swallow;Reduced epiglottic inversion;Reduced tongue base retraction Pharyngeal Material enters airway, remains ABOVE vocal cords then ejected out Pharyngeal- Thin Straw Reduced epiglottic inversion;Reduced tongue base retraction Pharyngeal -- Pharyngeal- Puree Reduced epiglottic inversion;Reduced tongue base retraction Pharyngeal -- Pharyngeal- Mechanical Soft -- Pharyngeal -- Pharyngeal- Regular Reduced epiglottic inversion;Reduced tongue base retraction Pharyngeal -- Pharyngeal- Multi-consistency Reduced epiglottic inversion;Reduced tongue base retraction Pharyngeal -- Pharyngeal- Pill Reduced epiglottic inversion;Reduced tongue base retraction  Pharyngeal -- Pharyngeal Comment --  CHL IP CERVICAL ESOPHAGEAL PHASE 06/23/2016 Cervical Esophageal Phase (No Data) Pudding Teaspoon -- Pudding Cup -- Honey Teaspoon -- Honey Cup -- Nectar Teaspoon -- Nectar Cup -- Nectar Straw -- Thin Teaspoon -- Thin Cup -- Thin Straw -- Puree -- Mechanical Soft -- Regular -- Multi-consistency -- Pill -- Cervical Esophageal Comment -- Luanna Salk,  MS [facility] SLP [phone]               Microbiology: No results found for this or any previous visit (from the past 240 hour(s)).   Labs: Basic Metabolic Panel:  Recent Labs Lab 06/22/16 1706 06/22/16 2157 06/23/16 0445 06/24/16 0547  NA 130*  --  128* 130*  K 2.9*  --  3.6 4.4  CL 92*  --  93* 91*  CO2 28  --  28 30  GLUCOSE 175*  --  128* 134*  BUN 10  --  10 13  CREATININE 0.85  --  0.66 0.73  CALCIUM 8.5*  --  7.8* 8.2*  MG  --  3.1*  --  1.9   Liver Function Tests:  Recent Labs Lab 06/24/16 0547  AST 19  ALT 27  ALKPHOS 75  BILITOT 0.5  PROT 6.0*  ALBUMIN 2.4*   No results for input(s): LIPASE, AMYLASE in the last 168 hours. No results for input(s): AMMONIA in the last 168 hours. CBC:  Recent Labs Lab 06/22/16 1706 06/23/16 0445 06/24/16 0547 06/25/16 1057  WBC 21.6* 14.5* 16.2* 9.7  NEUTROABS 20.0* 13.6* 14.9*  --   HGB 11.9* 9.7* 10.4* 10.4*  HCT 34.2* 28.0* 29.6* 29.9*  MCV 91.2 89.7 91.9 92.6  PLT 379 335 410* 411*   Cardiac Enzymes:  Recent Labs Lab 06/22/16 1706  TROPONINI <0.03   BNP: BNP (last 3 results)  Recent Labs  06/22/16 1706  BNP 254.1*    ProBNP (last 3 results) No results for input(s): PROBNP in the last 8760 hours.  CBG:  Recent Labs Lab 06/25/16 0723 06/25/16 1232 06/25/16 1718 06/25/16 2159 06/26/16 0747  GLUCAP 138* 175* 84 106* 87       Signed:  Nita Sells MD   [facility] 06/26/2016, 10:25 AM

## 2016-06-26 NOTE — Progress Notes (Signed)
  Speech Language Pathology Treatment: Dysphagia  Patient Details Name: Warren [name] MRN: [id number] DOB: 1955/04/11 Today's Date: 06/26/2016 Time: 3329-5188 SLP Time Calculation (min) (ACUTE ONLY): 35 min  Assessment / Plan / Recommendation Clinical Impression  Pt with congested breathing today - unable to clear secretions.   Upon consumption of water, pt with delayed weak cough.  Advised to precautions providing pt with MBS report verbally and in writing.    Home Health SLP advised when pt's breathing becomes better.  Advised to use flutter valve to help pulmonary status, strengthen hock and conduct effortful swallows in the interim time.  Pt able to verbalize 3-4 breathy words today - wife reports he recently received Ativan.  Teach back and demonstration used.   Maximizing nutrition when breathing stable advised.  Concern for pt's swallow to return to level to allow adequate po when combining dysphagia with his COPD and malnutrition/deconditioning.  Pt may benefit from discussions re: goals of care in future due to progressive neurological disease.    Family given opportunity to ask questions. Provided all information in writing.  Thanks for this consult.    HPI HPI: 61 yo male adm to [facility] with congestion and recent treatment with ABX.  Pt found to have Right lower lobe pna.   [place] + for COPD, candida esophagitis, spinocerebellar ataxia, Raynaud's, tobacco use.  Pt previously has undergone MBS as OP in 2016 May that showed moderate oropharyngeal-cervical esopahgeal dysphagia.  Pt denies problems swallowing, admits to issues with cough x 2 weeks with weakness and poor mobility, weight loss.  Current weight is 97 pounds and pt reports normal weight is 104.  Pt tx with ABX in coummunity but did not improve respiratory issues.       SLP Plan  Continue with current plan of care       Recommendations  Diet recommendations: Dysphagia 3 (mechanical soft);Thin liquid Liquids provided via: Cup;No  straw Medication Administration: Whole meds with puree (if tolerate with liquids) Compensations: Slow rate;Small sips/bites;Multiple dry swallows after each bite/sip;Follow solids with liquid;Other (Comment) (expectorate prn) Postural Changes and/or Swallow Maneuvers: Seated upright 90 degrees;Upright 30-60 min after meal                Follow up Recommendations:  (rec HH when pt's respiratory status stabilizes) SLP Visit Diagnosis: Dysphagia, oropharyngeal phase (R13.12) Plan: Continue with current plan of care       [place], [facility] SLP [phone]

## 2016-06-26 NOTE — Progress Notes (Signed)
SATURATION QUALIFICATIONS: (This note is used to comply with regulatory documentation for home oxygen)  Patient Saturations on Room Air at Rest = 87%  Patient Saturations on Room Air while Ambulating = 84%  Patient Saturations on 2.5 Liters of oxygen while Ambulating = 92%  Please briefly explain why patient needs home oxygen:

## 2016-06-26 NOTE — Progress Notes (Signed)
Spoke with Erlene Quan pt's son concerning [facility] needs. Son selected [facility] for HHRN/PT, referral given to in house rep.

## 2016-06-28 ENCOUNTER — Telehealth: Payer: Self-pay | Admitting: Adult Health

## 2016-06-28 NOTE — Telephone Encounter (Signed)
Noted.  Pt is following up with RB on 4/12.  Will close encounter.

## 2016-07-03 ENCOUNTER — Other Ambulatory Visit: Payer: Self-pay | Admitting: Family Medicine

## 2016-07-04 ENCOUNTER — Ambulatory Visit (INDEPENDENT_AMBULATORY_CARE_PROVIDER_SITE_OTHER): Payer: BLUE CROSS/BLUE SHIELD | Admitting: Family Medicine

## 2016-07-04 ENCOUNTER — Telehealth: Payer: Self-pay | Admitting: Adult Health

## 2016-07-04 ENCOUNTER — Encounter: Payer: Self-pay | Admitting: Family Medicine

## 2016-07-04 VITALS — BP 110/66 | HR 68 | Temp 97.6°F | Ht 67.0 in | Wt 93.2 lb

## 2016-07-04 DIAGNOSIS — J449 Chronic obstructive pulmonary disease, unspecified: Secondary | ICD-10-CM

## 2016-07-04 DIAGNOSIS — I5032 Chronic diastolic (congestive) heart failure: Secondary | ICD-10-CM

## 2016-07-04 DIAGNOSIS — E43 Unspecified severe protein-calorie malnutrition: Secondary | ICD-10-CM | POA: Diagnosis not present

## 2016-07-04 LAB — CBC WITH DIFFERENTIAL/PLATELET
BASOS PCT: 0 %
Basophils Absolute: 0 cells/uL (ref 0–200)
EOS ABS: 0 {cells}/uL — AB (ref 15–500)
Eosinophils Relative: 0 %
HCT: 31 % — ABNORMAL LOW (ref 38.5–50.0)
Hemoglobin: 10.2 g/dL — ABNORMAL LOW (ref 13.2–17.1)
LYMPHS PCT: 6 %
Lymphs Abs: 486 cells/uL — ABNORMAL LOW (ref 850–3900)
MCH: 31.2 pg (ref 27.0–33.0)
MCHC: 32.9 g/dL (ref 32.0–36.0)
MCV: 94.8 fL (ref 80.0–100.0)
MPV: 8.2 fL (ref 7.5–12.5)
Monocytes Absolute: 243 cells/uL (ref 200–950)
Monocytes Relative: 3 %
Neutro Abs: 7371 cells/uL (ref 1500–7800)
Neutrophils Relative %: 91 %
Platelets: 434 10*3/uL — ABNORMAL HIGH (ref 140–400)
RBC: 3.27 MIL/uL — ABNORMAL LOW (ref 4.20–5.80)
RDW: 13.9 % (ref 11.0–15.0)
WBC: 8.1 10*3/uL (ref 3.8–10.8)

## 2016-07-04 LAB — COMPREHENSIVE METABOLIC PANEL
ALT: 15 U/L (ref 9–46)
AST: 11 U/L (ref 10–35)
Albumin: 3 g/dL — ABNORMAL LOW (ref 3.6–5.1)
Alkaline Phosphatase: 82 U/L (ref 40–115)
BUN: 10 mg/dL (ref 7–25)
CHLORIDE: 95 mmol/L — AB (ref 98–110)
CO2: 29 mmol/L (ref 20–31)
Calcium: 8.2 mg/dL — ABNORMAL LOW (ref 8.6–10.3)
Creat: 0.67 mg/dL — ABNORMAL LOW (ref 0.70–1.25)
GLUCOSE: 136 mg/dL — AB (ref 65–99)
POTASSIUM: 4.5 mmol/L (ref 3.5–5.3)
Sodium: 131 mmol/L — ABNORMAL LOW (ref 135–146)
Total Bilirubin: 0.3 mg/dL (ref 0.2–1.2)
Total Protein: 5.8 g/dL — ABNORMAL LOW (ref 6.1–8.1)

## 2016-07-04 MED ORDER — HYDROXYZINE PAMOATE 50 MG PO CAPS: 50.0000 mg | ORAL_CAPSULE | Freq: Two times a day (BID) | ORAL | 1 refills | Status: AC | PRN

## 2016-07-04 MED ORDER — MEGESTROL ACETATE 625 MG/5ML PO SUSP: 625.0000 mg | Freq: Every day | ORAL | 0 refills | Status: DC

## 2016-07-04 NOTE — Telephone Encounter (Signed)
Forwarding to RB to make aware.  Nothing further needed.

## 2016-07-04 NOTE — Patient Instructions (Addendum)
Continue oxygen 2 Liters.   Maintain oxygen saturation at greater at 92% or greater.  Megace give 5 mls in the morning daily.  Continue nebulizer treatments as prescribed.  Take hydroxyzine 50-100 mg up to twice daily as needed for anxiety or sleep.  Return in 6 weeks for follow-up.    Home Oxygen Use, Adult When a medical condition keeps you from getting enough oxygen, your health care provider may instruct you to take extra oxygen at home. Your health care provider will let you know:  When to take oxygen.  For how long to take oxygen.  How quickly oxygen should be delivered (flow rate), in liters per minute (LPM or L/M). Home oxygen can be given through:  A mask.  A nasal cannula. This is a device or tube that goes in the nostrils.  A transtracheal catheter. This is a small, flexible tube placed in the trachea.  A tracheostomy. This is a surgically made opening in the trachea. These devices are connected with tubing to an oxygen source, such as:  A tank. Tanks hold oxygen in gas form. They must be replaced when the oxygen is used up.  A liquid oxygen device. This holds oxygen in liquid form. It must be replaced when the oxygen is used up.  An oxygen concentrator machine. This filters oxygen in the room. It uses electricity, so you must have a backup cylinder of oxygen in case the power goes out. Supplies needed: To use oxygen, you will need:  A mask, nasal cannula, transtracheal catheter, or tracheostomy.  An oxygen tank, a liquid oxygen device, or an oxygen concentrator.  The tape that your health care provider recommends (optional). If you use a transtracheal catheter and your prescribed flow rate is 1 LPM or greater, you will also need a humidifier. Risks and complications  Fire. This can happen if the oxygen is exposed to a heat source, flame, or spark.  Injury to skin. This can happen if liquid oxygen touches your skin.  Organ damage. This can happen if you  get too little oxygen. How to use oxygen Your health care provider will show you how to use your oxygen device. Follow her or his instructions. They may look something like this: 1. Wash your hands. 2. If you use an oxygen concentrator, make sure it is plugged in. 3. Place one end of the tube into the port on the tank, device, or machine. 4. Place the mask over your nose and mouth. Or, place the nasal cannula and secure it with tape if instructed. If you use a tracheostomy or transtracheal catheter, connect it to the oxygen source as directed. 5. Make sure the liter-flow setting on the machine is at the level prescribed by your health care provider. 6. Turn on the machine or adjust the knob on the tank or device to the correct liter-flow setting. 7. When you are done, turn off and unplug the machine, or turn the knob to OFF. How to clean and care for the oxygen supplies Nasal cannula   Clean it with a warm, wet cloth daily or as needed.  Wash it with a liquid soap once a week.  Rinse it thoroughly once or twice a week.  Replace it every 2-4 weeks.  If you have an infection, such as a cold or pneumonia, change the cannula when you get better. Mask   Replace it every 2-4 weeks.  If you have an infection, such as a cold or pneumonia, change the mask  when you get better. Humidifier bottle   Wash the bottle between each refill:  Wash it with soap and warm water.  Rinse it thoroughly.  Disinfect it and its top.  Air-dry it.  Make sure it is dry before you refill it. Oxygen concentrator   Clean the air filter at least twice a week according to directions from your home medical equipment and service company.  Wipe down the cabinet every day. To do this:  Unplug the unit.  Wipe down the cabinet with a damp cloth.  Dry the cabinet. Other equipment   Change any extra tubing every 1-3 months.  Follow instructions from your health care provider about taking care of any other  equipment. Safety tips Fire safety tips    Keep your oxygen and oxygen supplies at least 5 ft away from sources of heat, flames, and sparks at all times.  Do not allow smoking near your oxygen. Put up "no smoking" signs in your home.  Do not use materials that can burn (are flammable) while you use oxygen.  When you go to a restaurant with portable oxygen, ask to be seated in the nonsmoking section.  Keep a Data processing manager close by. Let your fire department know that you have oxygen in your home.  Test your home smoke detectors regularly. General safety tips   If you use an oxygen cylinder, make sure it is in a stand or secured to an object that will not move (fixed object).  If you use liquid oxygen, make sure its container is kept upright.  If you use an oxygen concentrator:  Tell Loss adjuster, chartered company. Make sure you are given priority service in the event that your power goes out.  Avoid using extension cords, if possible. Follow these instructions at home:  Use oxygen only as told by your health care provider.  Do not use alcohol or other drugs that make you relax (sedating drugs) unless instructed. They can slow down your breathing rate and make it hard to get in enough oxygen.  Know how and when to order a refill of oxygen.  Always keep a spare tank of oxygen. Plan ahead for holidays when you may not be able to get a prescription filled.  Use water-based lubricants on your lips or nostrils. Do not use oil-based products like petroleum jelly.  To prevent skin irritation on your cheeks or behind your ears, tuck some gauze under the tubing. Contact a health care provider if:  You get headaches often.  You have shortness of breath.  You have a lasting cough.  You have anxiety.  You are sleepy all the time.  You develop an illness that affects your breathing.  You cannot exercise at your regular level.  You are restless.  You have difficult or irregular  breathing, and it is getting worse.  You have a fever.  You have persistent redness under your nose. Get help right away if:  You are confused.  You have blue lips or fingernails.  You are struggling to breathe. This information is not intended to replace advice given to you by your health care provider. Make sure you discuss any questions you have with your health care provider. Document Released: 06/17/2003 Document Revised: 11/24/2015 Document Reviewed: 10/19/2015 Elsevier Interactive Patient Education  2017 [employer].

## 2016-07-04 NOTE — Progress Notes (Signed)
Patient ID: Warren [name], male    DOB: July 09, 1955, 61 y.o.   MRN: [id number]  PCP: Molli Barrows, FNP  Chief Complaint  Patient presents with  . Establish Care  . Hospitalization Follow-up    PNEUMONIA    Subjective:  HPI Warren [name] is a 61 y.o. male presents to establish care and for hospital follow-up. 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  Warren [name] presented to [facility] Emergency Department on 06/22/2016 for community acquired pneumonia for which he failed outpatient antibiotic treatment. He presented with hypoxia, Sats  87% room air, tachypnea, and tachycardia with PVC. Labs revealed hypokalemia 209, leukocytosis 21.6, and BNP 254.  He was immediately placed on IV levaquin, vancomycin, duo nebulizer treatments,  IV solumedrol and subsequently placed on 3L oxygen. Aforementioned therapy continued over the coarse of patient's 4 days hospitalization. He was discharged home with oxygen therapy that should be titrated to maintain sat minimally 92% (per family).  Family present today during visit. At present that are concern regarding resources for home care. Patient and his wife have moved in with their son as Warren [name] is not wheelchair bound and is unable to gain access into his home as the home is equipped with stairs. At present that have home health services consisting of PT and a RN that evaluates vital signs/patient assessment. At present the family plans to reside with son and spouse will provide care during the day while son is at work and son will provide care in the evening.  Warren [name] continues to require encouragement to consume food. He reports that "nothing tastes good".  He has a persistent urge to spit food out. He doesn't like ensure but tolerates the non-milk based Boost. All and all he reports persistent trouble with swallowing. He had a swallow  evaluation while in the hospital and diet recommendations include recommend  regular solids and thin liquids with modifications. For respiratory management he is presently on 2 L oxygen via nasal cannula, completing Augmentin for pneumonia, and he is prescribed nebulizer treatments every 6 hours. He has remained stable since discharge although requires 100% assistance with ADL. He denies chest pain, although he still has persistent intermittent shortness of breath with activity, continued cough , and wheezing which subsides after nebulizer treatments.   Social History   Social History  . Marital status: Married    Spouse name: N/A  . Number of children: 1  . Years of education: BS   Occupational History  . 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 Smoking status: Former Smoker    Packs/day: 1.50    Years: 30.00    Quit date: 07/25/2014  . Smokeless tobacco: Never Used     Comment: pt says he is smoking 5 or 6 cigarettes daily  . Alcohol use No     Comment: 2 beers monthly  . Drug use: No  . Sexual activity: Not on file   Other Topics Concern  . Not on file   Social History Narrative   Marital status: married x 31 years.      Children:  One child (25); no grandchildren.      Lives: with wife, son.  Has a barn; cats.      Employment: unemployed in 2015; previous truck driver x 30 years; hauls beer.  Drives locally  Tobacco: 1 ppd x 30 years      Alcohol:  Rare/none      Drugs: none      Exercise: sporadic      Seatbelt:  100%      Guns:  Unloaded.   Patient is right handed.   Patient drinks 3-4 cups of caffeine daily.    Family History  Problem Relation Age of Onset  . Heart disease Father     valve replacement; CHF; heart transplant candidate  . Hypertension Mother   . Hyperlipidemia Mother   . Diabetes Mother   . Stroke Mother 4    cause of death.  . Diabetes Sister   . Hyperlipidemia Sister   . Stroke Brother    Review  of Systems See HPI  Patient Active Problem List   Diagnosis Date Noted  . Protein-calorie malnutrition, severe 06/23/2016  . Chronic combined systolic and diastolic CHF (congestive heart failure) ([place]) 06/22/2016  . Hypokalemia 06/22/2016  . Acute respiratory failure with hypoxia ([place]) 06/22/2016  . Chest pain 07/25/2014  . 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 Dysphagia, pharyngoesophageal phase 06/15/2014  . Raynaud disease 06/15/2014  . Dysarthria 02/16/2014  . 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 BPH (benign prostatic hyperplasia) 09/28/2013  . Personal history of colonic polyps 12/02/2012  . Pneumonia 09/29/2012  . Hyponatremia 09/29/2012  . Loss of weight 09/29/2012  . Anemia 09/17/2012  . COPD exacerbation ([place]) 12/21/2010  . Tobacco abuse 12/21/2010    Allergies  Allergen Reactions  . Contrast Media [Iodinated Diagnostic Agents] Hives and Itching    Happened 40 years ago  . Iodine Hives    Prior to Admission medications   Medication Sig Start Date End Date Taking? Authorizing Provider  albuterol (PROVENTIL HFA;VENTOLIN HFA) 108 (90 Base) MCG/ACT inhaler Inhale 2 puffs into the lungs as needed for wheezing or shortness of breath. 03/23/16  Yes Collene Gobble, MD  albuterol (PROVENTIL) (2.5 MG/3ML) 0.083% nebulizer solution Take 3 mLs (2.5 mg total) by nebulization every 6 (six) hours as needed for wheezing or shortness of breath. 07/14/15  Yes Wardell Honour, MD  budesonide-formoterol [facility]) 160-4.5 MCG/ACT inhaler Inhale 2 puffs into the lungs 2 (two) times daily. 06/13/16  Yes Juanito Doom, MD  dextromethorphan-guaiFENesin [facility] DM) 30-600 MG 12hr tablet Take 1 tablet by mouth daily.   Yes Historical Provider, MD  fluticasone (FLONASE) 50 MCG/ACT nasal spray Place into both nostrils daily as needed for allergies or rhinitis (for allergies).   Yes Historical Provider, MD  ibuprofen (ADVIL,MOTRIN) 200 MG tablet Take 400 mg by mouth every 6 (six) hours as  needed for mild pain or moderate pain.   Yes Historical Provider, MD  ipratropium (ATROVENT) 0.02 % nebulizer solution USE 1 VIAL IN NEBULIZER 4 (FOUR) TIMES DAILY. 07/03/16  Yes Chelle Jeffery, PA-C  LORazepam (ATIVAN) 0.5 MG tablet Take 1 tablet (0.5 mg total) by mouth 2 (two) times daily. 06/26/16  Yes Nita Sells, MD  metoprolol succinate (TOPROL XL) 25 MG 24 hr tablet Take 0.5 tablets (12.5 mg total) by mouth daily. 06/26/16  Yes Nita Sells, MD  predniSONE (DELTASONE) 20 MG tablet Take 2 tablets (40 mg total) by mouth daily before breakfast. 06/27/16  Yes Nita Sells, MD  Spacer/Aero-Holding Chambers (E-Z SPACER) inhaler Use as instructed 03/22/14  Yes Tereasa Coop, PA-C  amoxicillin-clavulanate (AUGMENTIN) 875-125 MG tablet Take 1 tablet by mouth every 12 (twelve) hours. Patient not taking: Reported on 07/04/2016 06/26/16   Nita Sells,  MD    Past Medical, Surgical Family and Social History reviewed and updated.    Objective:   Today's Vitals   07/04/16 1533  BP: 110/66  Pulse: 68  Temp: 97.6 F (36.4 C)  TempSrc: Oral  SpO2: 98%  Weight: 93 lb 3.2 oz (42.3 kg)  Height: 5\' 7"  (1.702 m)    Wt Readings from Last 3 Encounters:  07/04/16 93 lb 3.2 oz (42.3 kg)  06/22/16 95 lb 14.4 oz (43.5 kg)  06/13/16 99 lb 9.6 oz (45.2 kg)   Physical Exam  Constitutional: He is oriented to person, place, and time. Vital signs are normal. He appears cachectic. He appears ill.  HENT:  Head: Normocephalic.  Neck: Normal range of motion.  Cardiovascular: Normal rate, regular rhythm, normal heart sounds and intact distal pulses.   Pulmonary/Chest: He has wheezes.  Dyspnea and orthopnea noted   Abdominal: Bowel sounds are normal. There is no tenderness.  Musculoskeletal: He exhibits no edema.  Wheelchair bound. Pronounced weakness   Neurological: He is alert and oriented to person, place, and time.  Psychiatric: He has a normal mood and affect.  Appropriate  pleasant demeanor       Assessment & Plan:  1. Chronic obstructive pulmonary disease, unspecified COPD type ([place]) - CBC with Differential - Comprehensive metabolic panel - Hemoglobin A1c - Magnesium - Thyroid Panel With TSH - For home use only DME Pulse oximeter  Plan: Check oxygen saturations periodically throughout the day to ensure patient's oxygen doesn't require titration.  2. Severe Protein-calorie malnutrition  -Start Megace 5 ml daily to improve appetite  3. Grade 1 Diastolic Heart Failure  -Stable at present  Do to the complexity of the patient's multiple chronic conditions, please return for follow-up evaluation in 1 month or sooner if patient's condition worsens. I will follow-up with you if any therapies change after I've taken additional time to review all of the patient's previous medical history.  Continue all other treatments as prescribed post discharge from hospital.  Greater than 60 minutes spent, interviewing patient and family , review of prior medical history , prior hospitalization, referrals, treatment, and discussion of plan with patient and family.  Carroll Sage. Kenton Kingfisher, MSN, [facility] [address] [place], [place] [address] [phone]

## 2016-07-05 LAB — THYROID PANEL WITH TSH
FREE THYROXINE INDEX: 3.4 (ref 1.4–3.8)
T3 Uptake: 40 % — ABNORMAL HIGH (ref 22–35)
T4 TOTAL: 8.6 ug/dL (ref 4.5–12.0)
TSH: 1.21 m[IU]/L (ref 0.40–4.50)

## 2016-07-05 LAB — MAGNESIUM: Magnesium: 1.9 mg/dL (ref 1.5–2.5)

## 2016-07-05 LAB — HEMOGLOBIN A1C
Hgb A1c MFr Bld: 6.1 % — ABNORMAL HIGH (ref <5.7)
Mean Plasma Glucose: 128 mg/dL

## 2016-07-08 ENCOUNTER — Other Ambulatory Visit: Payer: Self-pay | Admitting: Family Medicine

## 2016-07-09 MED ORDER — ALBUTEROL SULFATE (2.5 MG/3ML) 0.083% IN NEBU
2.5000 mg | INHALATION_SOLUTION | Freq: Four times a day (QID) | RESPIRATORY_TRACT | 12 refills | Status: AC | PRN
Start: 2016-07-09 — End: ?

## 2016-07-10 ENCOUNTER — Telehealth: Payer: Self-pay | Admitting: Emergency Medicine

## 2016-07-10 MED ORDER — PREDNISONE 20 MG PO TABS: 40.0000 mg | ORAL_TABLET | Freq: Every day | ORAL | 0 refills | Status: DC

## 2016-07-10 NOTE — Telephone Encounter (Signed)
Called and spoke to pt's wife, Enid Derry. Pt is needing a refill of prednisone until f/u appt. Rx sent to preferred pharmacy. Enid Derry verbalized understanding and denied any further questions or concerns at this time.

## 2016-07-13 ENCOUNTER — Ambulatory Visit (HOSPITAL_COMMUNITY)
Admission: RE | Admit: 2016-07-13 | Discharge: 2016-07-13 | Disposition: A | Discharge status: Home / Self Care | Payer: BLUE CROSS/BLUE SHIELD | Source: Ambulatory Visit | Attending: Family Medicine | Admitting: Family Medicine

## 2016-07-13 ENCOUNTER — Ambulatory Visit (INDEPENDENT_AMBULATORY_CARE_PROVIDER_SITE_OTHER): Payer: BLUE CROSS/BLUE SHIELD | Admitting: Family Medicine

## 2016-07-13 ENCOUNTER — Encounter: Payer: Self-pay | Admitting: Family Medicine

## 2016-07-13 ENCOUNTER — Encounter (HOSPITAL_COMMUNITY): Payer: Self-pay

## 2016-07-13 ENCOUNTER — Inpatient Hospital Stay (HOSPITAL_COMMUNITY)
Admission: EM | Admit: 2016-07-13 | Discharge: 2016-07-18 | DRG: 871 | Disposition: A | Discharge status: Hospice | Payer: BLUE CROSS/BLUE SHIELD | Attending: Internal Medicine | Admitting: Internal Medicine

## 2016-07-13 VITALS — BP 94/59 | HR 126 | Ht 67.0 in | Wt 90.6 lb

## 2016-07-13 DIAGNOSIS — R9389 Abnormal findings on diagnostic imaging of other specified body structures: Secondary | ICD-10-CM

## 2016-07-13 DIAGNOSIS — Z8701 Personal history of pneumonia (recurrent): Secondary | ICD-10-CM

## 2016-07-13 DIAGNOSIS — Z9981 Dependence on supplemental oxygen: Secondary | ICD-10-CM

## 2016-07-13 DIAGNOSIS — I7300 Raynaud's syndrome without gangrene: Secondary | ICD-10-CM | POA: Diagnosis present

## 2016-07-13 DIAGNOSIS — Z9842 Cataract extraction status, left eye: Secondary | ICD-10-CM

## 2016-07-13 DIAGNOSIS — J9621 Acute and chronic respiratory failure with hypoxia: Secondary | ICD-10-CM | POA: Diagnosis present

## 2016-07-13 DIAGNOSIS — Z9841 Cataract extraction status, right eye: Secondary | ICD-10-CM

## 2016-07-13 DIAGNOSIS — R471 Dysarthria and anarthria: Secondary | ICD-10-CM | POA: Diagnosis present

## 2016-07-13 DIAGNOSIS — Z87891 Personal history of nicotine dependence: Secondary | ICD-10-CM

## 2016-07-13 DIAGNOSIS — E876 Hypokalemia: Secondary | ICD-10-CM | POA: Diagnosis not present

## 2016-07-13 DIAGNOSIS — E440 Moderate protein-calorie malnutrition: Secondary | ICD-10-CM | POA: Insufficient documentation

## 2016-07-13 DIAGNOSIS — A419 Sepsis, unspecified organism: Principal | ICD-10-CM | POA: Diagnosis present

## 2016-07-13 DIAGNOSIS — J189 Pneumonia, unspecified organism: Secondary | ICD-10-CM

## 2016-07-13 DIAGNOSIS — J441 Chronic obstructive pulmonary disease with (acute) exacerbation: Secondary | ICD-10-CM

## 2016-07-13 DIAGNOSIS — Z7952 Long term (current) use of systemic steroids: Secondary | ICD-10-CM | POA: Diagnosis not present

## 2016-07-13 DIAGNOSIS — Z681 Body mass index (BMI) 19 or less, adult: Secondary | ICD-10-CM

## 2016-07-13 DIAGNOSIS — Z66 Do not resuscitate: Secondary | ICD-10-CM | POA: Diagnosis present

## 2016-07-13 DIAGNOSIS — E43 Unspecified severe protein-calorie malnutrition: Secondary | ICD-10-CM | POA: Diagnosis present

## 2016-07-13 DIAGNOSIS — Z79899 Other long term (current) drug therapy: Secondary | ICD-10-CM | POA: Diagnosis not present

## 2016-07-13 DIAGNOSIS — F419 Anxiety disorder, unspecified: Secondary | ICD-10-CM | POA: Diagnosis present

## 2016-07-13 DIAGNOSIS — R0602 Shortness of breath: Secondary | ICD-10-CM | POA: Diagnosis not present

## 2016-07-13 DIAGNOSIS — R918 Other nonspecific abnormal finding of lung field: Secondary | ICD-10-CM | POA: Insufficient documentation

## 2016-07-13 DIAGNOSIS — R938 Abnormal findings on diagnostic imaging of other specified body structures: Secondary | ICD-10-CM

## 2016-07-13 DIAGNOSIS — Z515 Encounter for palliative care: Secondary | ICD-10-CM | POA: Diagnosis not present

## 2016-07-13 DIAGNOSIS — J9601 Acute respiratory failure with hypoxia: Secondary | ICD-10-CM | POA: Diagnosis not present

## 2016-07-13 DIAGNOSIS — R0902 Hypoxemia: Secondary | ICD-10-CM

## 2016-07-13 DIAGNOSIS — J690 Pneumonitis due to inhalation of food and vomit: Secondary | ICD-10-CM | POA: Diagnosis present

## 2016-07-13 DIAGNOSIS — M810 Age-related osteoporosis without current pathological fracture: Secondary | ICD-10-CM | POA: Diagnosis present

## 2016-07-13 DIAGNOSIS — J9600 Acute respiratory failure, unspecified whether with hypoxia or hypercapnia: Secondary | ICD-10-CM | POA: Diagnosis not present

## 2016-07-13 DIAGNOSIS — I5042 Chronic combined systolic (congestive) and diastolic (congestive) heart failure: Secondary | ICD-10-CM

## 2016-07-13 DIAGNOSIS — N400 Enlarged prostate without lower urinary tract symptoms: Secondary | ICD-10-CM | POA: Diagnosis present

## 2016-07-13 DIAGNOSIS — R1314 Dysphagia, pharyngoesophageal phase: Secondary | ICD-10-CM | POA: Diagnosis present

## 2016-07-13 DIAGNOSIS — E871 Hypo-osmolality and hyponatremia: Secondary | ICD-10-CM | POA: Diagnosis present

## 2016-07-13 DIAGNOSIS — G119 Hereditary ataxia, unspecified: Secondary | ICD-10-CM | POA: Diagnosis present

## 2016-07-13 DIAGNOSIS — R627 Adult failure to thrive: Secondary | ICD-10-CM | POA: Diagnosis present

## 2016-07-13 DIAGNOSIS — R634 Abnormal weight loss: Secondary | ICD-10-CM | POA: Diagnosis present

## 2016-07-13 DIAGNOSIS — G118 Other hereditary ataxias: Secondary | ICD-10-CM | POA: Diagnosis not present

## 2016-07-13 DIAGNOSIS — J449 Chronic obstructive pulmonary disease, unspecified: Secondary | ICD-10-CM | POA: Diagnosis present

## 2016-07-13 LAB — URINALYSIS, ROUTINE W REFLEX MICROSCOPIC
Bacteria, UA: NONE SEEN
Bilirubin Urine: NEGATIVE
GLUCOSE, UA: NEGATIVE mg/dL
Ketones, ur: 5 mg/dL — AB
Leukocytes, UA: NEGATIVE
NITRITE: NEGATIVE
PH: 6 (ref 5.0–8.0)
PROTEIN: NEGATIVE mg/dL
Specific Gravity, Urine: 1.018 (ref 1.005–1.030)
Squamous Epithelial / LPF: NONE SEEN

## 2016-07-13 LAB — CBC WITH DIFFERENTIAL/PLATELET
BASOS ABS: 0 {cells}/uL (ref 0–200)
BASOS ABS: 0 10*3/uL (ref 0.0–0.1)
Basophils Relative: 0 %
Basophils Relative: 0 %
EOS PCT: 0 %
Eosinophils Absolute: 0 cells/uL — ABNORMAL LOW (ref 15–500)
Eosinophils Absolute: 0 10*3/uL (ref 0.0–0.7)
Eosinophils Relative: 0 %
HCT: 32.1 % — ABNORMAL LOW (ref 38.5–50.0)
HEMATOCRIT: 33.8 % — AB (ref 39.0–52.0)
HEMOGLOBIN: 10.8 g/dL — AB (ref 13.2–17.1)
Hemoglobin: 11.5 g/dL — ABNORMAL LOW (ref 13.0–17.0)
LYMPHS ABS: 1040 {cells}/uL (ref 850–3900)
LYMPHS PCT: 5 %
LYMPHS PCT: 5 %
Lymphs Abs: 1.2 10*3/uL (ref 0.7–4.0)
MCH: 32 pg (ref 27.0–33.0)
MCH: 32 pg (ref 26.0–34.0)
MCHC: 33.6 g/dL (ref 32.0–36.0)
MCHC: 34 g/dL (ref 30.0–36.0)
MCV: 95.3 fL (ref 80.0–100.0)
MCV: 94.2 fL (ref 78.0–100.0)
MONOS PCT: 2 %
MONOS PCT: 3 %
MPV: 8.6 fL (ref 7.5–12.5)
Monocytes Absolute: 416 cells/uL (ref 200–950)
Monocytes Absolute: 0.7 10*3/uL (ref 0.1–1.0)
NEUTROS PCT: 93 %
NEUTROS PCT: 92 %
Neutro Abs: 19344 cells/uL — ABNORMAL HIGH (ref 1500–7800)
Neutro Abs: 22.5 10*3/uL — ABNORMAL HIGH (ref 1.7–7.7)
Platelets: 339 10*3/uL (ref 140–400)
Platelets: 324 10*3/uL (ref 150–400)
RBC: 3.37 MIL/uL — ABNORMAL LOW (ref 4.20–5.80)
RBC: 3.59 MIL/uL — AB (ref 4.22–5.81)
RDW: 14.4 % (ref 11.0–15.0)
RDW: 14.7 % (ref 11.5–15.5)
WBC: 20.8 10*3/uL — AB (ref 3.8–10.8)
WBC: 24.4 10*3/uL — AB (ref 4.0–10.5)

## 2016-07-13 LAB — COMPREHENSIVE METABOLIC PANEL
ALBUMIN: 3 g/dL — AB (ref 3.6–5.1)
ALBUMIN: 3.2 g/dL — AB (ref 3.5–5.0)
ALK PHOS: 78 U/L (ref 38–126)
ALT: 13 U/L (ref 9–46)
ALT: 16 U/L — ABNORMAL LOW (ref 17–63)
ANION GAP: 9 (ref 5–15)
AST: 15 U/L (ref 10–35)
AST: 26 U/L (ref 15–41)
Alkaline Phosphatase: 75 U/L (ref 40–115)
BUN: 20 mg/dL (ref 7–25)
BUN: 21 mg/dL — ABNORMAL HIGH (ref 6–20)
CHLORIDE: 97 mmol/L — AB (ref 98–110)
CHLORIDE: 98 mmol/L — AB (ref 101–111)
CO2: 26 mmol/L (ref 20–31)
CO2: 26 mmol/L (ref 22–32)
Calcium: 8.2 mg/dL — ABNORMAL LOW (ref 8.6–10.3)
Calcium: 8.5 mg/dL — ABNORMAL LOW (ref 8.9–10.3)
Creat: 0.79 mg/dL (ref 0.70–1.25)
Creatinine, Ser: 0.8 mg/dL (ref 0.61–1.24)
GFR calc Af Amer: >60 mL/min (ref >60)
GFR calc non Af Amer: >60 mL/min (ref >60)
GLUCOSE: 108 mg/dL — AB (ref 65–99)
GLUCOSE: 98 mg/dL (ref 65–99)
POTASSIUM: 4.5 mmol/L (ref 3.5–5.1)
Potassium: 4.4 mmol/L (ref 3.5–5.3)
SODIUM: 132 mmol/L — AB (ref 135–146)
SODIUM: 133 mmol/L — AB (ref 135–145)
Total Bilirubin: 0.7 mg/dL (ref 0.2–1.2)
Total Bilirubin: 0.8 mg/dL (ref 0.3–1.2)
Total Protein: 5.5 g/dL — ABNORMAL LOW (ref 6.1–8.1)
Total Protein: 6.4 g/dL — ABNORMAL LOW (ref 6.5–8.1)

## 2016-07-13 LAB — I-STAT CG4 LACTIC ACID, ED: Lactic Acid, Venous: 2.67 mmol/L (ref 0.5–1.9)

## 2016-07-13 LAB — LACTIC ACID, PLASMA: Lactic Acid, Venous: 1.5 mmol/L (ref 0.5–1.9)

## 2016-07-13 LAB — MRSA PCR SCREENING: MRSA by PCR: NEGATIVE

## 2016-07-13 LAB — BRAIN NATRIURETIC PEPTIDE: Brain Natriuretic Peptide: 145 pg/mL — ABNORMAL HIGH (ref <100)

## 2016-07-13 LAB — PROTIME-INR
INR: 1.2
Prothrombin Time: 15.3 seconds — ABNORMAL HIGH (ref 11.4–15.2)

## 2016-07-13 MED ORDER — SODIUM CHLORIDE 0.9 % IV BOLUS (SEPSIS)
500.0000 mL | Freq: Once | INTRAVENOUS | Status: AC
  Administered 2016-07-13: 500 mL via INTRAVENOUS

## 2016-07-13 MED ORDER — HYDROXYZINE PAMOATE 50 MG PO CAPS: 50.0000 mg | ORAL_CAPSULE | Freq: Two times a day (BID) | ORAL | Status: DC | PRN

## 2016-07-13 MED ORDER — SENNOSIDES-DOCUSATE SODIUM 8.6-50 MG PO TABS: 1.0000 | ORAL_TABLET | Freq: Every evening | ORAL | Status: DC | PRN

## 2016-07-13 MED ORDER — ENOXAPARIN SODIUM 30 MG/0.3ML ~~LOC~~ SOLN
30.0000 mg | SUBCUTANEOUS | Status: DC
  Administered 2016-07-13 – 2016-07-17 (×5): 30 mg via SUBCUTANEOUS
  Filled 2016-07-13 (×5): qty 0.3

## 2016-07-13 MED ORDER — ACETAMINOPHEN 325 MG PO TABS: 650.0000 mg | ORAL_TABLET | Freq: Four times a day (QID) | ORAL | Status: DC | PRN

## 2016-07-13 MED ORDER — BOOST PLUS PO LIQD
237.0000 mL | Freq: Three times a day (TID) | ORAL | Status: DC
  Administered 2016-07-13: 237 mL via ORAL
  Filled 2016-07-13 (×4): qty 237

## 2016-07-13 MED ORDER — ONDANSETRON HCL 4 MG PO TABS: 4.0000 mg | ORAL_TABLET | Freq: Four times a day (QID) | ORAL | Status: DC | PRN

## 2016-07-13 MED ORDER — SODIUM CHLORIDE 0.9 % IV SOLN: INTRAVENOUS | Status: DC

## 2016-07-13 MED ORDER — METHYLPREDNISOLONE SODIUM SUCC 40 MG IJ SOLR
40.0000 mg | Freq: Three times a day (TID) | INTRAMUSCULAR | Status: DC
  Administered 2016-07-13 – 2016-07-14 (×2): 40 mg via INTRAVENOUS
  Filled 2016-07-13 (×2): qty 1

## 2016-07-13 MED ORDER — IPRATROPIUM-ALBUTEROL 0.5-2.5 (3) MG/3ML IN SOLN
3.0000 mL | Freq: Four times a day (QID) | RESPIRATORY_TRACT | Status: DC
  Administered 2016-07-13 – 2016-07-17 (×14): 3 mL via RESPIRATORY_TRACT
  Filled 2016-07-13 (×14): qty 3

## 2016-07-13 MED ORDER — METOPROLOL SUCCINATE 12.5 MG HALF TABLET
12.5000 mg | ORAL_TABLET | Freq: Every day | ORAL | Status: DC
  Administered 2016-07-14 – 2016-07-17 (×4): 12.5 mg via ORAL
  Filled 2016-07-13 (×5): qty 1

## 2016-07-13 MED ORDER — LORAZEPAM 1 MG PO TABS
1.0000 mg | ORAL_TABLET | Freq: Once | ORAL | Status: AC
  Administered 2016-07-14: 1 mg via ORAL
  Filled 2016-07-13: qty 1

## 2016-07-13 MED ORDER — VANCOMYCIN HCL IN DEXTROSE 750-5 MG/150ML-% IV SOLN: 750.0000 mg | INTRAVENOUS | Status: DC

## 2016-07-13 MED ORDER — CEFEPIME HCL 1 G IJ SOLR
1.0000 g | Freq: Three times a day (TID) | INTRAMUSCULAR | Status: DC
  Administered 2016-07-13 – 2016-07-18 (×14): 1 g via INTRAVENOUS
  Filled 2016-07-13 (×16): qty 1

## 2016-07-13 MED ORDER — DM-GUAIFENESIN ER 30-600 MG PO TB12: 1.0000 | ORAL_TABLET | Freq: Two times a day (BID) | ORAL | Status: DC

## 2016-07-13 MED ORDER — VANCOMYCIN HCL IN DEXTROSE 1-5 GM/200ML-% IV SOLN
1000.0000 mg | Freq: Once | INTRAVENOUS | Status: AC
Start: 2016-07-13 — End: 2016-07-13
  Administered 2016-07-13: 1000 mg via INTRAVENOUS
  Filled 2016-07-13: qty 200

## 2016-07-13 MED ORDER — DIPHENHYDRAMINE HCL 50 MG/ML IJ SOLN
INTRAMUSCULAR | Status: AC
  Filled 2016-07-13: qty 1

## 2016-07-13 MED ORDER — ONDANSETRON HCL 4 MG/2ML IJ SOLN: 4.0000 mg | Freq: Four times a day (QID) | INTRAMUSCULAR | Status: DC | PRN

## 2016-07-13 MED ORDER — DIPHENHYDRAMINE HCL 50 MG/ML IJ SOLN
25.0000 mg | Freq: Once | INTRAMUSCULAR | Status: AC
  Administered 2016-07-13: 25 mg via INTRAVENOUS

## 2016-07-13 MED ORDER — LEVALBUTEROL HCL 0.63 MG/3ML IN NEBU: 0.6300 mg | INHALATION_SOLUTION | RESPIRATORY_TRACT | Status: DC | PRN

## 2016-07-13 MED ORDER — HYDROXYZINE HCL 50 MG PO TABS
50.0000 mg | ORAL_TABLET | Freq: Two times a day (BID) | ORAL | Status: DC | PRN
Start: 2016-07-13 — End: 2016-07-18
  Administered 2016-07-13 – 2016-07-16 (×4): 50 mg via ORAL
  Filled 2016-07-13 (×5): qty 1

## 2016-07-13 MED ORDER — ACETAMINOPHEN 650 MG RE SUPP: 650.0000 mg | Freq: Four times a day (QID) | RECTAL | Status: DC | PRN

## 2016-07-13 MED ORDER — FLUTICASONE PROPIONATE 50 MCG/ACT NA SUSP
2.0000 | Freq: Every day | NASAL | Status: DC | PRN
  Administered 2016-07-15 – 2016-07-16 (×2): 2 via NASAL
  Filled 2016-07-13: qty 16

## 2016-07-13 MED ORDER — DEXTROSE 5 % IV SOLN
2.0000 g | Freq: Once | INTRAVENOUS | Status: AC
  Administered 2016-07-13: 2 g via INTRAVENOUS
  Filled 2016-07-13: qty 2

## 2016-07-13 NOTE — Care Management (Signed)
EDCM reviewed CM consult. Notified Tommy at [facility] of patient's admission. Presenter, broadcasting BSN CCM

## 2016-07-13 NOTE — Progress Notes (Addendum)
Patient ID: Warren [name], male    DOB: 13-Dec-1955, 61 y.o.   MRN: [id number]  PCP: Warren Barrows, FNP  Chief Complaint  Patient presents with  . Shortness of Breath    Subjective:  HPI Warren [name] is a 61 y.o. male presents for evaluation of shortness of breath and low home oxygen saturation. Active medical problems include: CHF, COPD, spinocerebellar ataxia, and dysphagia. He recently established here at [facility] as a new patient following an hospital admission for pneumonia and complicated by CHF / COPD exacerbation. Family reports over the last few days, he has had to frequently increase his home oxygen in order to maintain  oxygen saturations greater than 85%. Wife reports that he had a a low grade temperature yesterday Tmax 99.1. Last food intake occurred around noon which consisted of soup and crackers. He admits to not tolerating nebulizer treatments as he feels it worsens his ability to catch his breath. Family reports a "wet cough" on yesterday, although denies ant cough, chest tightness or swelling today. He admits to lower back and rib pain. Feels as if he is gasping for air.  Family reports concern regarding patient's overall acuity of care needed on a day to day basis and is interested in either SNF or 24 hour in home care. Current receives in home RN care for vitals and assessment only from [place] [facility]. In home physical therapy sessions recently ended.   Chief Complaint  Patient presents with  . Shortness of Breath     Social History   Social History  . Marital status: Married    Spouse name: N/A  . Number of children: 1  . Years of education: BS   Occupational History  . 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 Smoking status: Former Smoker    Packs/day: 1.50    Years: 30.00    Quit date: 07/25/2014  . Smokeless tobacco: Never Used     Comment: pt says he is smoking 5 or 6 cigarettes daily  .  Alcohol use No     Comment: 2 beers monthly  . Drug use: No  . Sexual activity: Not on file   Other Topics Concern  . Not on file   Social History Narrative   Marital status: married x 31 years.      Children:  One child (25); no grandchildren.      Lives: with wife, son.  Has a barn; cats.      Employment: unemployed in 2015; previous truck driver x 30 years; hauls beer.  Drives locally     Tobacco: 1 ppd x 30 years      Alcohol:  Rare/none      Drugs: none      Exercise: sporadic      Seatbelt:  100%      Guns:  Unloaded.   Patient is right handed.   Patient drinks 3-4 cups of caffeine daily.    Family History  Problem Relation Age of Onset  . Heart disease Father     valve replacement; CHF; heart transplant candidate  . Hypertension Mother   . Hyperlipidemia Mother   . Diabetes Mother   . Stroke Mother 65    cause of death.  . Diabetes Sister   . Hyperlipidemia Sister   . Stroke Brother    Review of Systems See HPI Patient Active Problem List   Diagnosis Date  Noted  . Protein-calorie malnutrition, severe 06/23/2016  . Chronic combined systolic and diastolic CHF (congestive heart failure) ([place]) 06/22/2016  . Hypokalemia 06/22/2016  . Acute respiratory failure with hypoxia ([place]) 06/22/2016  . Chest pain 07/25/2014  . 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 Dysphagia, pharyngoesophageal phase 06/15/2014  . Raynaud disease 06/15/2014  . Dysarthria 02/16/2014  . 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 BPH (benign prostatic hyperplasia) 09/28/2013  . Personal history of colonic polyps 12/02/2012  . Pneumonia 09/29/2012  . Hyponatremia 09/29/2012  . Loss of weight 09/29/2012  . Anemia 09/17/2012  . COPD exacerbation ([place]) 12/21/2010  . Tobacco abuse 12/21/2010    Allergies  Allergen Reactions  . Contrast Media [Iodinated Diagnostic Agents] Hives and Itching    Happened 40 years ago  . Iodine Hives    Prior to Admission medications   Medication Sig Start  Date End Date Taking? Authorizing Provider  albuterol (PROVENTIL HFA;VENTOLIN HFA) 108 (90 Base) MCG/ACT inhaler Inhale 2 puffs into the lungs as needed for wheezing or shortness of breath. 03/23/16   Collene Gobble, MD  albuterol (PROVENTIL) (2.5 MG/3ML) 0.083% nebulizer solution Take 3 mLs (2.5 mg total) by nebulization every 6 (six) hours as needed for wheezing or shortness of breath. 07/09/16   Sedalia Muta, FNP  amoxicillin-clavulanate (AUGMENTIN) 875-125 MG tablet Take 1 tablet by mouth every 12 (twelve) hours. Patient not taking: Reported on 07/04/2016 06/26/16   Nita Sells, MD  budesonide-formoterol [facility]) 160-4.5 MCG/ACT inhaler Inhale 2 puffs into the lungs 2 (two) times daily. 06/13/16   Juanito Doom, MD  dextromethorphan-guaiFENesin [facility] DM) 30-600 MG 12hr tablet Take 1 tablet by mouth daily.    Historical Provider, MD  fluticasone (FLONASE) 50 MCG/ACT nasal spray Place into both nostrils daily as needed for allergies or rhinitis (for allergies).    Historical Provider, MD  hydrOXYzine (VISTARIL) 50 MG capsule Take 1-2 capsules (50-100 mg total) by mouth 2 (two) times daily as needed for anxiety. 07/04/16   Sedalia Muta, FNP  ibuprofen (ADVIL,MOTRIN) 200 MG tablet Take 400 mg by mouth every 6 (six) hours as needed for mild pain or moderate pain.    Historical Provider, MD  ipratropium (ATROVENT) 0.02 % nebulizer solution USE 1 VIAL IN NEBULIZER 4 (FOUR) TIMES DAILY. 07/03/16   Chelle Jeffery, PA-C  LORazepam (ATIVAN) 0.5 MG tablet Take 1 tablet (0.5 mg total) by mouth 2 (two) times daily. 06/26/16   Nita Sells, MD  megestrol (MEGACE ES) 625 MG/5ML suspension Take 5 mLs (625 mg total) by mouth daily. 07/04/16   Sedalia Muta, FNP  metoprolol succinate (TOPROL XL) 25 MG 24 hr tablet Take 0.5 tablets (12.5 mg total) by mouth daily. 06/26/16   Nita Sells, MD  predniSONE (DELTASONE) 20 MG tablet Take 2 tablets (40 mg total) by  mouth daily before breakfast. 07/10/16   Collene Gobble, MD  Spacer/Aero-Holding Chambers (E-Z SPACER) inhaler Use as instructed 03/22/14   Tereasa Coop, PA-C    Past Medical, Surgical Family and Social History reviewed and updated.    Objective:   Today's Vitals   07/13/16 1115  Pulse: (!) 126  Weight: 90 lb 9.6 oz (41.1 kg)  Height: 5\' 7"  (1.702 m)    Wt Readings from Last 3 Encounters:  07/13/16 90 lb 9.6 oz (41.1 kg)  07/04/16 93 lb 3.2 oz (42.3 kg)  06/22/16 95 lb 14.4 oz (43.5 kg)   Physical Exam  Constitutional: He appears well-developed and well-nourished.  HENT:  Head: Normocephalic and atraumatic.  Neck: Normal range of motion. Neck supple.  Cardiovascular: Regular rhythm.  Tachycardia present.   Pulmonary/Chest: Tachypnea noted. He is in respiratory distress. He has decreased breath sounds in the right upper field, the right middle field, the right lower field, the left upper field and the left middle field.  Musculoskeletal:  Wheelchair confined due to ataxia and weakness   Neurological: He is alert.  Skin: Skin is dry.  Psychiatric: He has a normal mood and affect. His behavior is normal. Judgment and thought content normal.       Assessment & Plan:  1. Hypoxia - CBC with Differential - Comprehensive metabolic panel - DG Chest 2 View  2. COPD exacerbation (HCC)   -Titrate oxygen 4 liters via Nasal Cannula   3. Chronic combined systolic and diastolic CHF (congestive heart failure) (HCC) - Brain natriuretic peptide  Dg Chest 2 View  Result Date: 07/13/2016 CLINICAL DATA:  Recent pneumonia.  Shortness of breath. EXAM: CHEST  2 VIEW COMPARISON:  06/22/2016 FINDINGS: The lungs are hyperinflated likely secondary to COPD. Bilateral chronic interstitial lung disease. Hazy lingular airspace disease concerning for pneumonia. Near complete resolution of right lower lobe airspace disease with a small area of persistent right middle lobe airspace disease. Small  bilateral pleural effusions. The heart and mediastinal contours are unremarkable. The osseous structures are unremarkable. IMPRESSION: Mild persistent right middle lobe and lingular airspace disease concerning for persistent pneumonia. Followup PA and lateral chest X-ray is recommended in 3-4 weeks following trial of antibiotic therapy to ensure resolution and exclude underlying malignancy. Electronically Signed   By: Kathreen Devoid   On: 07/13/2016 12:11   Will repeat study with a CT of the Chest in the coming weeks to rule out malignancy.  Due to patient presentation on exam and chest x-ray results, advised Mr. Leys's son to take father to the [facility] Emergency department for further treatment and evaluation.   Discussion of long-term care if admitted today will need to be discussed with social worker at hospital.  I will place a referral to [facility] and recommend daily in nursing assistant to aide patient with ADL's. Discussed Advance Directives and patient family reports that there has not been any final decisions made. Recommended that we address acute concerns today for respiratory compromise and we can    Level 5 -patient referred emergently to [place]. Kenton Kingfisher, MSN, [facility] [address] [place], [place] [address] [phone]

## 2016-07-13 NOTE — H&P (Addendum)
History and Physical    Warren [name] ZOX:[id number] DOB: 09/15/1955 DOA: 07/13/2016    PCP: Molli Barrows, FNP  Patient coming from: home  Chief Complaint: shortness of breath and cough  HPI: Warren [name] is a 61 y.o. male with medical history significant for COPD on 2 L o2 at home follows with Dr Lamonte Sakai,  Spinocerebellar ataxia, Rayauds disease, recently admitted for pneumonia which was right lower lobe and suspected to be aspiration. He received a total of 7 days of treatment, finishing up with Augmentin at home. He saw his PCP today According to family symptoms of cough and congestion improved however slowly began to recur with resultant shortness of breath as well. He is coughing up yellowish brown mucus. He presents to Hospital in respiratory distress and initially required BiPAP. Now on 4 L of oxygen breathing heavily. Admits to being anxious and has been using hydroxyzine at home for anxiety. Has not taken his medications today. He has been taking 20 mg of prednisone daily without a taper as prescribed by Dr. Lamonte Sakai. No complaint of chest pain or fevers.  ED Course:  Heart rate in the 120s, respiratory rate in the 20s, pulse ox 95% on 4 L of oxygen. WBC count 24.4, lactic acid 2.67 Chest x-ray shows right middle lobe and lingular lung disease Has been given Maxipime, vancomycin, 1 L of normal saline  Review of Systems:  5 lb wt loss since leaving the hospital with poor appetite Burning micturition Anxiety All other systems reviewed and apart from HPI, are negative.  Past Medical History:  Diagnosis Date  . Abnormal MRI of head   . Allergic rhinitis   . Candidal esophagitis (HCC)    Dr. Benson Norway 10/2012  . COPD (chronic obstructive pulmonary disease) ([place])   . Dysarthria 02/16/2014  . Dysphagia, pharyngoesophageal phase 06/15/2014  . 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 Osteoporosis 04/10/2009   L hip fracture; T score -4.2 in 2012.  Fosamax for one year.  . Pneumonia   . Raynaud disease  06/15/2014   Bilateral hands  . Spinocerebellar ataxia type 6 ([place]) 06/15/2014  . Testicular atrophy    Right    Past Surgical History:  Procedure Laterality Date  . CATARACT EXTRACTION     BOTH EYES  . COLONOSCOPY W/ POLYPECTOMY  12/07/2006   three polyps sigmoid.  [name]. Repeat 3 years.  . COLONOSCOPY W/ POLYPECTOMY  09/24/2012   five sessile polyps removed.  Internal and external hemorrhoids.  . egd  09/24/2012   hiatal hernia; candidal esophagitis, hematin in stomach; no active source of bleeding.  Marland Kitchen EYE SURGERY  04/10/2014   B cataract surgery  . INGUINAL HERNIA REPAIR     bilateral  . NASAL FRACTURE SURGERY    . VASECTOMY      Social History:   reports that he quit smoking about 2-3 years ago. He has a 45.00 pack-year smoking history. He has never used smokeless tobacco. He reports that he does not drink alcohol or use drugs. He lives with his wife and son.  Allergies  Allergen Reactions  . Contrast Media [Iodinated Diagnostic Agents] Hives and Itching  . Iodine Hives and Itching    Family History  Problem Relation Age of Onset  . Heart disease Father     valve replacement; CHF; heart transplant candidate  . Hypertension Mother   . Hyperlipidemia Mother   . Diabetes Mother   . Stroke Mother 69    cause of death.  . Diabetes Sister   .  Hyperlipidemia Sister   . Stroke Brother      Prior to Admission medications   Medication Sig Start Date End Date Taking? Authorizing Provider  albuterol (PROVENTIL HFA;VENTOLIN HFA) 108 (90 Base) MCG/ACT inhaler Inhale 2 puffs into the lungs as needed for wheezing or shortness of breath. Patient taking differently: Inhale 2 puffs into the lungs every 6 (six) hours as needed for wheezing or shortness of breath.  03/23/16  Yes Collene Gobble, MD  albuterol (PROVENTIL) (2.5 MG/3ML) 0.083% nebulizer solution Take 3 mLs (2.5 mg total) by nebulization every 6 (six) hours as needed for wheezing or shortness of breath. 07/09/16  Yes Sedalia Muta, FNP  budesonide-formoterol [facility]) 160-4.5 MCG/ACT inhaler Inhale 2 puffs into the lungs 2 (two) times daily. 06/13/16  Yes Juanito Doom, MD  dextromethorphan-guaiFENesin [facility] DM) 30-600 MG 12hr tablet Take 1 tablet by mouth daily.   Yes Historical Provider, MD  fluticasone (FLONASE) 50 MCG/ACT nasal spray Place 2 sprays into both nostrils daily as needed for rhinitis.    Yes Historical Provider, MD  hydrOXYzine (VISTARIL) 50 MG capsule Take 1-2 capsules (50-100 mg total) by mouth 2 (two) times daily as needed for anxiety. 07/04/16  Yes Sedalia Muta, FNP  ibuprofen (ADVIL,MOTRIN) 200 MG tablet Take 400 mg by mouth every 6 (six) hours as needed for mild pain or moderate pain.   Yes Historical Provider, MD  ipratropium (ATROVENT) 0.02 % nebulizer solution Take 0.5 mg by nebulization every 6 (six) hours as needed for wheezing or shortness of breath.   Yes Historical Provider, MD  metoprolol succinate (TOPROL XL) 25 MG 24 hr tablet Take 0.5 tablets (12.5 mg total) by mouth daily. Patient taking differently: Take 12.5 mg by mouth at bedtime.  06/26/16  Yes Nita Sells, MD  predniSONE (DELTASONE) 20 MG tablet Take 2 tablets (40 mg total) by mouth daily before breakfast. 07/10/16  Yes Collene Gobble, MD  LORazepam (ATIVAN) 0.5 MG tablet Take 1 tablet (0.5 mg total) by mouth 2 (two) times daily. Patient not taking: Reported on 07/13/2016 06/26/16   Nita Sells, MD  megestrol (MEGACE ES) 625 MG/5ML suspension Take 5 mLs (625 mg total) by mouth daily. Patient not taking: Reported on 07/13/2016 07/04/16   Sedalia Muta, FNP    Physical Exam: Vitals:   07/13/16 1340 07/13/16 1400 07/13/16 1436 07/13/16 1500  BP: 101/71 102/76 121/79 124/84  Pulse: (!) 123 (!) 123 (!) 130 (!) 122  Resp: (!) 24 (!) 27 19 (!) 24  SpO2: 94% 94% 93% 95%      Constitutional: Breathing heavily through his mouth, hunched over in the bed- appears severely  malnourished Eyes: PERTLA, lids and conjunctivae normal ENMT: Mucous membranes are dry. Posterior pharynx clear of any exudate or lesions. Normal dentition.  Neck: normal, supple, no masses, no thyromegaly Respiratory: clear to auscultation bilaterally, very poor air entry. no wheezing, no crackles. Hyperventilating pulse ox is 95-96% on 4 L of oxygen No accessory muscle use.  Cardiovascular: S1 & S2 heard, regular rate and rhythm, no murmurs / rubs / gallops. No extremity edema. 2+ pedal pulses. No carotid bruits.  Abdomen: No distension, no tenderness, no masses palpated. No hepatosplenomegaly. Bowel sounds normal.  Musculoskeletal: no clubbing / cyanosis. No joint deformity upper and lower extremities. Good ROM, no contractures. Normal muscle tone.  Skin: no rashes, lesions, ulcers. No induration Neurologic: CN 2-12 grossly intact. Sensation intact, DTR normal. Strength 5/5 in all 4 limbs.  Psychiatric: Normal judgment  and insight. Alert and oriented x 3. Anxious     Labs on Admission: I have personally reviewed following labs and imaging studies  CBC:  Recent Labs Lab 07/13/16 1127 07/13/16 1314  WBC 20.8* 24.4*  NEUTROABS 19,344* 22.5*  HGB 10.8* 11.5*  HCT 32.1* 33.8*  MCV 95.3 94.2  PLT 339 709   Basic Metabolic Panel:  Recent Labs Lab 07/13/16 1127 07/13/16 1314  NA 132* 133*  K 4.4 4.5  CL 97* 98*  CO2 26 26  GLUCOSE 108* 98  BUN 20 21*  CREATININE 0.79 0.80  CALCIUM 8.2* 8.5*   GFR: Estimated Creatinine Clearance: 55.7 mL/min (by C-G formula based on SCr of 0.8 mg/dL). Liver Function Tests:  Recent Labs Lab 07/13/16 1127 07/13/16 1314  AST 15 26  ALT 13 16*  ALKPHOS 75 78  BILITOT 0.7 0.8  PROT 5.5* 6.4*  ALBUMIN 3.0* 3.2*   No results for input(s): LIPASE, AMYLASE in the last 168 hours. No results for input(s): AMMONIA in the last 168 hours. Coagulation Profile:  Recent Labs Lab 07/13/16 1314  INR 1.20   Cardiac Enzymes: No results for  input(s): CKTOTAL, CKMB, CKMBINDEX, TROPONINI in the last 168 hours. BNP (last 3 results) No results for input(s): PROBNP in the last 8760 hours. HbA1C: No results for input(s): HGBA1C in the last 72 hours. CBG: No results for input(s): GLUCAP in the last 168 hours. Lipid Profile: No results for input(s): CHOL, HDL, LDLCALC, TRIG, CHOLHDL, LDLDIRECT in the last 72 hours. Thyroid Function Tests: No results for input(s): TSH, T4TOTAL, FREET4, T3FREE, THYROIDAB in the last 72 hours. Anemia Panel: No results for input(s): VITAMINB12, FOLATE, FERRITIN, TIBC, IRON, RETICCTPCT in the last 72 hours. Urine analysis:    Component Value Date/Time   COLORURINE YELLOW 07/13/2016 [place] 07/13/2016 1545   LABSPEC 1.018 07/13/2016 1545   PHURINE 6.0 07/13/2016 1545   GLUCOSEU NEGATIVE 07/13/2016 1545   HGBUR SMALL (A) 07/13/2016 1545   BILIRUBINUR NEGATIVE 07/13/2016 1545   BILIRUBINUR negative 07/14/2015 1655   BILIRUBINUR neg 09/28/2013 1333   KETONESUR 5 (A) 07/13/2016 1545   PROTEINUR NEGATIVE 07/13/2016 1545   UROBILINOGEN 0.2 07/14/2015 1655   UROBILINOGEN 0.2 09/08/2007 0719   NITRITE NEGATIVE 07/13/2016 1545   LEUKOCYTESUR NEGATIVE 07/13/2016 1545   Sepsis Labs: @LABRCNTIP (procalcitonin:4,lacticidven:4) )No results found for this or any previous visit (from the past 240 hour(s)).   Radiological Exams on Admission: Dg Chest 2 View  Result Date: 07/13/2016 CLINICAL DATA:  Recent pneumonia.  Shortness of breath. EXAM: CHEST  2 VIEW COMPARISON:  06/22/2016 FINDINGS: The lungs are hyperinflated likely secondary to COPD. Bilateral chronic interstitial lung disease. Hazy lingular airspace disease concerning for pneumonia. Near complete resolution of right lower lobe airspace disease with a small area of persistent right middle lobe airspace disease. Small bilateral pleural effusions. The heart and mediastinal contours are unremarkable. The osseous structures are  unremarkable. IMPRESSION: Mild persistent right middle lobe and lingular airspace disease concerning for persistent pneumonia. Followup PA and lateral chest X-ray is recommended in 3-4 weeks following trial of antibiotic therapy to ensure resolution and exclude underlying malignancy. Electronically Signed   By: Kathreen Devoid   On: 07/13/2016 12:11      Assessment/Plan Principal Problem: Acute on chronic respiratory failure - Due to HCAP and COPD exacerbation    HCAP (healthcare-associated pneumonia) - According to his family, he did improve with Unasyn and Augmentin but steadily became worse -He has received cefepime and vancomycin  in the ER-we'll continue cefepime only -Obtain sputum culture -SLP eval performed on prior admission did not reveal aspiration and regular diet with thin liquids was recommended - U strep negative last admission  Active Problems:   COPD exacerbation -He is been taking 20 mg of prednisone daily-we'll start Solu-Medrol 40 mg 3 times a day -DuoNeb's every 6 hours, Xopenex when necessary (tachycardia) - Mucinex - Continue O2 as needed - Pulmonary has been consulted to develop a long-term plan for him  Anxiety -Appears to be severely anxious at this time and is hyperventilating -Takes hydroxyzine at home-we'll order a one-time dose of Benadryl in the ER and order hydroxyzine when necessary as he takes at home    Hyponatremia -Not a new issue - Likely dehydrated versus SIADH from lung issues-     Loss of weight/   Protein-calorie malnutrition, severe - Asked 5 pounds since discharge and 7 pounds prior to that - Add supplements - he was unable to afford Megace but is already on steroids which should help with his appetite  -may need to add Marinol    Spinocerebellar ataxia type 6  - PT eval when stable  V. tach noted on last admission - Continue metoprolol    Chronic combined systolic and diastolic CHF (congestive heart failure)  -Per echo in 4/16 EF  45-50% and grade 1 diastolic dysfunction - repeat 2-D echo - He is received a liter of normal saline in the ER-we'll hold off on giving him further fluids and monitor I&O closely   DVT prophylaxis: Lovenox Code Status: Full code  Family Communication: Wife and son at bedside  Disposition Plan: Stepdown  Consults called: Pulmonary  Admission status: Inpatient    Debbe Odea MD [facility] Pager: [email or web address] Password [email or web address] 7PM-7AM, please contact night-coverage   07/13/2016, 4:37 PM

## 2016-07-13 NOTE — ED Notes (Signed)
Dr. Wynelle Cleveland asked if Warren [name]'s bed assignment was appropriate despite Warren [name]'s HR being in the 120s-130s and Warren [name] is at 95% on 4L [place].  Dr. Wynelle Cleveland states the Warren [name] is appropriate for a med-surg bed at this time.  Tammy, AC made aware.

## 2016-07-13 NOTE — Progress Notes (Signed)
PT refused Flutter device. States he has one at home and family member agrees to bring to hospital.

## 2016-07-13 NOTE — Progress Notes (Signed)
Pt placed back on VM at 55%, d/t inc WOB.

## 2016-07-13 NOTE — Progress Notes (Signed)
Pt pulled VM off, wants Trigg back on.  He immediately resumed the tripod position, and became more tachypneic, using accessory muscles to breathe.  Encouraged to go back on the VM however he refused.  Family at bedside.

## 2016-07-13 NOTE — Progress Notes (Signed)
Patient refused BIPAP. Patient was unable to tolerate it today and does not want to try to wear it at this time. BIPAP is in patients room at bedside.  RT advised patient to have RT called if he changes his mind. RT will monitor patient as needed.

## 2016-07-13 NOTE — ED Notes (Signed)
Pt brought in by family with c/o shortness and breath that started yesterday. Pt had a chest xray done today PTA which was positive for pneumonia and pt was sent to ER. Pt presents with labored, breathing, HR 126 at triage, 83% on 4L [place]. Pt taken to room 15, placed on 15L NRB, O2 sats at 83%.Dr. Lacinda Axon informed of pts vital signs. Recently admitted for pneumonia last Month. Pt A&OX4.

## 2016-07-13 NOTE — Progress Notes (Signed)
Pharmacy Antibiotic Note  Warren [name] is a 61 y.o. male admitted on 07/13/2016 with HCAP.  Pharmacy has been consulted for vancomycin and cefepime dosing. CXR: Mild persistent right middle lobe and lingular airspace disease concerning for persistent pneumonia. Lactate 2.67, creat 0.8; Wt 41 kg, HR 126  Plan: cefepime 2 gm IV x1 then cefepime 1 gm IV q8 Vancomycin 1 gm x 1 then  Vancomycin 750 mg IV every 24 hours.  Goal trough 15-20 mcg/mL.  f/u renal fxn, wbc, temp, culture data Vancomycin levels as needed   No data recorded.  No results for input(s): WBC, CREATININE, LATICACIDVEN, VANCOTROUGH, VANCOPEAK, VANCORANDOM, GENTTROUGH, GENTPEAK, GENTRANDOM, TOBRATROUGH, TOBRAPEAK, TOBRARND, AMIKACINPEAK, AMIKACINTROU, AMIKACIN in the last 168 hours.  Estimated Creatinine Clearance: 55.7 mL/min (A) (by C-G formula based on SCr of 0.67 mg/dL (L)).    Allergies  Allergen Reactions  . Contrast Media [Iodinated Diagnostic Agents] Hives and Itching  . Iodine Hives and Itching    Antimicrobials this admission: Vanc 4/5>> Cefepime 4/5>>  Dose adjustments this admission:  Microbiology results: 4/5 BCx2>>  Thank you for allowing pharmacy to be a part of this patient's care.  Eudelia Bunch, Pharm.D. [phone] 07/13/2016 1:31 PM

## 2016-07-13 NOTE — ED Notes (Signed)
Family member  [phone]

## 2016-07-13 NOTE — Addendum Note (Signed)
Addended by: Scot Jun on: 07/13/2016 01:07 PM   Modules accepted: Orders

## 2016-07-13 NOTE — Progress Notes (Signed)
PT unable to tolerate BiPAP at this time. RN aware.

## 2016-07-13 NOTE — Consult Note (Signed)
Name: Warren [name] MRN: [id number] DOB: Aug 21, 1955    ADMISSION DATE:  07/13/2016 CONSULTATION DATE:  4/5  REFERRING MD :  Dr. Wynelle Cleveland  CHIEF COMPLAINT:  Dyspnea  HISTORY OF PRESENT ILLNESS:  61 year old male with PMH as below, which is significant for COPD (on daily prednisone and home O2)  followed by Dr. Lamonte Sakai, cerebellar ataxia, and frequent pneumonias. He has a history of dysphagia secondary to the cerebellar ataxia and was recently admitted with RLL PNA thought secondary to aspiration. 4/5 he presented to PCP with complaints of progressive dyspnea with associated cough and congestion. Cough is productive for yellow sputum. He was referred to the emergency department where he was noted to be in respiratory distress initially requiring BiPAP. He improved with BiPAP and was weaned to 4L [place]. CXR concerning for persistent PNA. He was admitted to the hospitalist team and treated for HCAP with broad spectrum antibiotics, COPD exacerbation with IV steroids and nebulized bronchodilators. PCCM has been asked to see for further evaluation.  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  has a past surgical history that includes Nasal fracture surgery; Vasectomy; Inguinal hernia repair; Colonoscopy w/ polypectomy (12/07/2006); egd (09/24/2012); Colonoscopy w/ polypectomy (09/24/2012); Cataract extraction; and Eye surgery  (04/10/2014). Prior to Admission medications   Medication Sig Start Date End Date Taking? Authorizing Provider  albuterol (PROVENTIL HFA;VENTOLIN HFA) 108 (90 Base) MCG/ACT inhaler Inhale 2 puffs into the lungs as needed for wheezing or shortness of breath. Patient taking differently: Inhale 2 puffs into the lungs every 6 (six) hours as needed for wheezing or shortness of breath.  03/23/16  Yes Collene Gobble, MD  albuterol (PROVENTIL) (2.5 MG/3ML) 0.083% nebulizer solution Take 3 mLs (2.5 mg total) by nebulization every 6 (six) hours as needed for wheezing or shortness of breath. 07/09/16  Yes Sedalia Muta, FNP  budesonide-formoterol [facility] 160-4.5 MCG/ACT inhaler Inhale 2 puffs into the lungs 2 (two) times daily. 06/13/16  Yes Juanito Doom, MD  dextromethorphan-guaiFENesin [facility] DM) 30-600 MG 12hr tablet Take 1 tablet by mouth daily.   Yes Historical Provider, MD  fluticasone (FLONASE) 50 MCG/ACT nasal spray Place 2 sprays into both nostrils daily as needed for rhinitis.    Yes Historical Provider, MD  hydrOXYzine (VISTARIL) 50 MG capsule Take 1-2 capsules (50-100 mg total) by mouth 2 (two) times daily as needed for anxiety. 07/04/16  Yes Sedalia Muta, FNP  ibuprofen (ADVIL,MOTRIN) 200 MG tablet Take 400 mg by mouth every 6 (six) hours as needed for mild pain or moderate pain.   Yes Historical Provider, MD  ipratropium (ATROVENT) 0.02 % nebulizer solution Take 0.5 mg by nebulization every 6 (six) hours as needed for wheezing or shortness of breath.   Yes Historical Provider, MD  metoprolol succinate (TOPROL XL) 25 MG 24 hr tablet Take 0.5 tablets (12.5 mg total) by mouth daily. Patient taking differently: Take 12.5 mg by mouth at bedtime.  06/26/16  Yes Nita Sells, MD  predniSONE (DELTASONE) 20 MG tablet Take 2 tablets (40 mg total) by mouth daily before breakfast. 07/10/16  Yes Collene Gobble, MD  LORazepam (ATIVAN) 0.5 MG tablet Take 1 tablet (0.5 mg total) by  mouth 2 (two) times daily. Patient not taking: Reported on 07/13/2016 06/26/16   Nita Sells, MD  megestrol (MEGACE ES) 625 MG/5ML suspension Take 5 mLs (625 mg total) by mouth daily. Patient not taking: Reported on 07/13/2016 07/04/16   Sedalia Muta, FNP   Allergies  Allergen Reactions  . Contrast Media [Iodinated Diagnostic Agents] Hives and Itching  . Iodine Hives and Itching    FAMILY HISTORY:  family history includes Diabetes in his mother and sister; Heart disease in his father; Hyperlipidemia in his mother and sister; Hypertension in his mother; Stroke in his brother; Stroke (age of onset: 1) in his mother. SOCIAL HISTORY:  reports that he quit smoking about 1 years ago. He has a 45.00 pack-year smoking history. He has never used smokeless tobacco. He reports that he does not drink alcohol or use drugs.  REVIEW OF SYSTEMS:  Positive as noted above  Constitutional: Negative for fever, chills, weight loss, malaise/fatigue and diaphoresis.  HENT: Negative for hearing loss, ear pain, nosebleeds, congestion, sore throat, neck pain, tinnitus and ear discharge.   Eyes: Negative for blurred vision, double vision, photophobia, pain, discharge and redness.  Respiratory: Negative for  hemoptysis, sputum production, wheezing and stridor.   Cardiovascular: Negative for chest pain, palpitations, orthopnea, claudication, leg swelling and PND.  Gastrointestinal: Negative for heartburn, nausea, vomiting, abdominal pain, diarrhea, constipation, blood in stool and melena.  Genitourinary: Negative for dysuria, urgency, frequency, hematuria and flank pain.  Musculoskeletal: Negative for myalgias, back pain, joint pain and falls.  Skin: Negative for itching and rash.  Neurological: Negative for dizziness, tingling, tremors, sensory change, speech change, focal weakness, seizures, loss of consciousness, weakness and headaches.  Endo/Heme/Allergies: Negative for environmental allergies and  polydipsia. Does not bruise/bleed easily.  SUBJECTIVE:   VITAL SIGNS: Pulse Rate:  [49-130] 122 (04/05 1500) Resp:  [19-27] 24 (04/05 1500) BP: (94-124)/(59-84) 124/84 (04/05 1500) SpO2:  [83 %-95 %] 95 % (04/05 1500) Weight:  [41.1 kg (90 lb 9.6 oz)] 41.1 kg (90 lb 9.6 oz) (04/05 1115)  PHYSICAL EXAMINATION:  Gen. Cachectic, in mod distress, anxious affect ENT - no thrush, no post nasal drip Neck: No JVD, no thyromegaly, no carotid bruits Lungs:  accessory muscles +, no dullness to percussion, decreased without rales or rhonchi  Cardiovascular: Rhythm regular, heart sounds  normal, no murmurs or gallops, no peripheral edema Abdomen: soft and non-tender, no hepatosplenomegaly, BS normal. Musculoskeletal: No deformities, no cyanosis or clubbing Neuro:  alert, non focal, diffuse muscle wasting    Recent Labs Lab 07/13/16 1127 07/13/16 1314  NA 132* 133*  K 4.4 4.5  CL 97* 98*  CO2 26 26  BUN 20 21*  CREATININE 0.79 0.80  GLUCOSE 108* 98    Recent Labs Lab 07/13/16 1127 07/13/16 1314  HGB 10.8* 11.5*  HCT 32.1* 33.8*  WBC 20.8* 24.4*  PLT 339 324   Dg Chest 2 View  Result Date: 07/13/2016 CLINICAL DATA:  Recent pneumonia.  Shortness of breath. EXAM: CHEST  2 VIEW COMPARISON:  06/22/2016 FINDINGS: The lungs are hyperinflated likely secondary to COPD. Bilateral chronic interstitial lung disease. Hazy lingular airspace disease concerning for pneumonia. Near complete resolution of right lower lobe airspace disease with a small area of persistent right middle  lobe airspace disease. Small bilateral pleural effusions. The heart and mediastinal contours are unremarkable. The osseous structures are unremarkable. IMPRESSION: Mild persistent right middle lobe and lingular airspace disease concerning for persistent pneumonia. Followup PA and lateral chest X-ray is recommended in 3-4 weeks following trial of antibiotic therapy to ensure resolution and exclude underlying malignancy.  Electronically Signed   By: Kathreen Devoid   On: 07/13/2016 12:11    ASSESSMENT / PLAN:  Feel that this episode is related to aspiration, he was recently admitted 3/15 and treated with antibiotics. His underlying neurodegenerative disease seems to be progressing. For the last 4 weeks he has not been ambulatory, his swallowing has been affected and his voice is very weak. All this suggests that he has been on some degree of chronic respiratory failure. He has simply not recovered from his last pneumonia  Recurrent pneumonia, favor aspiration Acute respiratory failure Underlying COPD  Recommend- I had a frank discussion with the patient and his wife and son in the room. They clearly understand that he is a progressive disease and that mechanical ventilation will not improve his outcome. He was very clear that he did not want mechanical ventilation at any cost. He did not tolerate BiPAP in the emergency room but was willing to try this again.  We will try him with a full facemask on low settings 8/5 and hopefully this will help with his work of breathing. We will keep this on every 4 hours with with a break and then resume nocturnal ventilation. If he recovers from this episode, we may even consider home nocturnal ventilation (NIV)  Empiric antibiotics will be continued for HCAP  Based on his wishes, DO NOT RESUSCITATE order will be issued  Kara Mead MD. FCCP. [facility] Pager [phone] If no response call [phone]   07/13/2016      07/13/2016, 5:00 PM

## 2016-07-13 NOTE — Progress Notes (Signed)
Rt placed BIPAP on pt in ICU. Pt could not tolerate. Pt removed mask himself. Family and RN at bedside. Rt placed pt on 55% VM.

## 2016-07-14 ENCOUNTER — Inpatient Hospital Stay (HOSPITAL_COMMUNITY): Payer: BLUE CROSS/BLUE SHIELD

## 2016-07-14 DIAGNOSIS — E43 Unspecified severe protein-calorie malnutrition: Secondary | ICD-10-CM

## 2016-07-14 DIAGNOSIS — J9600 Acute respiratory failure, unspecified whether with hypoxia or hypercapnia: Secondary | ICD-10-CM

## 2016-07-14 DIAGNOSIS — E440 Moderate protein-calorie malnutrition: Secondary | ICD-10-CM | POA: Insufficient documentation

## 2016-07-14 LAB — BASIC METABOLIC PANEL
ANION GAP: 8 (ref 5–15)
BUN: 18 mg/dL (ref 6–20)
CALCIUM: 8.1 mg/dL — AB (ref 8.9–10.3)
CO2: 23 mmol/L (ref 22–32)
Chloride: 101 mmol/L (ref 101–111)
Creatinine, Ser: 0.77 mg/dL (ref 0.61–1.24)
GFR calc Af Amer: >60 mL/min (ref >60)
Glucose, Bld: 121 mg/dL — ABNORMAL HIGH (ref 65–99)
Potassium: 4.3 mmol/L (ref 3.5–5.1)
SODIUM: 132 mmol/L — AB (ref 135–145)

## 2016-07-14 LAB — CBC
HCT: 27.5 % — ABNORMAL LOW (ref 39.0–52.0)
Hemoglobin: 9.3 g/dL — ABNORMAL LOW (ref 13.0–17.0)
MCH: 31.7 pg (ref 26.0–34.0)
MCHC: 33.8 g/dL (ref 30.0–36.0)
MCV: 93.9 fL (ref 78.0–100.0)
PLATELETS: 286 10*3/uL (ref 150–400)
RBC: 2.93 MIL/uL — AB (ref 4.22–5.81)
RDW: 14.9 % (ref 11.5–15.5)
WBC: 19.3 10*3/uL — AB (ref 4.0–10.5)

## 2016-07-14 LAB — ECHOCARDIOGRAM COMPLETE
Height: 67 in
WEIGHTICAEL: 1446.22 [oz_av]

## 2016-07-14 MED ORDER — GUAIFENESIN ER 600 MG PO TB12
600.0000 mg | ORAL_TABLET | Freq: Two times a day (BID) | ORAL | Status: DC
  Administered 2016-07-14 – 2016-07-18 (×9): 600 mg via ORAL
  Filled 2016-07-14 (×9): qty 1

## 2016-07-14 MED ORDER — ADULT MULTIVITAMIN W/MINERALS CH
1.0000 | ORAL_TABLET | Freq: Every day | ORAL | Status: DC
  Administered 2016-07-14 – 2016-07-18 (×5): 1 via ORAL
  Filled 2016-07-14 (×5): qty 1

## 2016-07-14 MED ORDER — METRONIDAZOLE 500 MG PO TABS
500.0000 mg | ORAL_TABLET | Freq: Three times a day (TID) | ORAL | Status: DC
  Administered 2016-07-14 – 2016-07-18 (×12): 500 mg via ORAL
  Filled 2016-07-14 (×13): qty 1

## 2016-07-14 MED ORDER — ORAL CARE MOUTH RINSE
15.0000 mL | Freq: Two times a day (BID) | OROMUCOSAL | Status: DC
  Administered 2016-07-14 – 2016-07-17 (×6): 15 mL via OROMUCOSAL

## 2016-07-14 MED ORDER — ENSURE ENLIVE PO LIQD
237.0000 mL | Freq: Three times a day (TID) | ORAL | Status: DC
  Administered 2016-07-14 – 2016-07-16 (×5): 237 mL via ORAL

## 2016-07-14 MED ORDER — METHYLPREDNISOLONE SODIUM SUCC 40 MG IJ SOLR
40.0000 mg | Freq: Two times a day (BID) | INTRAMUSCULAR | Status: DC
  Administered 2016-07-14 – 2016-07-18 (×9): 40 mg via INTRAVENOUS
  Filled 2016-07-14 (×10): qty 1

## 2016-07-14 NOTE — Progress Notes (Signed)
  Echocardiogram 2D Echocardiogram has been performed.  Darlina Sicilian M 07/14/2016, 2:50 PM

## 2016-07-14 NOTE — Evaluation (Addendum)
Clinical/Bedside Swallow Evaluation Patient Details  Name: Warren [name] MRN: [id number] Date of Birth: September 26, 1955  Today's Date: 07/14/2016 Time: SLP Start Time (ACUTE ONLY): 7026 SLP Stop Time (ACUTE ONLY): 1140 SLP Time Calculation (min) (ACUTE ONLY): 53 min  Past Medical History:  Past Medical History:  Diagnosis Date  . Abnormal MRI of head   . Allergic rhinitis   . Candidal esophagitis ([facility])    Dr. Benson Norway 10/2012  . COPD (chronic obstructive pulmonary disease) ([place])   . Dysarthria 02/16/2014  . Dysphagia, pharyngoesophageal phase 06/15/2014  . 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 Osteoporosis 04/10/2009   L hip fracture; T score -4.2 in 2012.  Fosamax for one year.  . Pneumonia   . Raynaud disease 06/15/2014   Bilateral hands  . 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 Testicular atrophy    Right   Past Surgical History:  Past Surgical History:  Procedure Laterality Date  . CATARACT EXTRACTION     BOTH EYES  . COLONOSCOPY W/ POLYPECTOMY  12/07/2006   three polyps sigmoid.  [name]. Repeat 3 years.  . COLONOSCOPY W/ POLYPECTOMY  09/24/2012   five sessile polyps removed.  Internal and external hemorrhoids.  . egd  09/24/2012   hiatal hernia; candidal esophagitis, hematin in stomach; no active source of bleeding.  Marland Kitchen EYE SURGERY  04/10/2014   B cataract surgery  . INGUINAL HERNIA REPAIR     bilateral  . NASAL FRACTURE SURGERY    . VASECTOMY     HPI:  61 yo male adm to [facility] with congestion and recent treatment with ABX- failed treatment and still has Right lower lobe pna.   PMH + for COPD, candida esophagitis, spinocerebellar ataxia, Raynaud's, tobacco use.  Pt previously has undergone MBSs x 2 - 2016  and 06/2016 with findings of Pt denies problems swallowing, admits to issues with cough x 2 weeks with weakness and poor mobility, weight loss.  Current weight is 90 pounds and during last visit 06/23/16 he was 97 pounds.  Pt denies dysphagia but admits to coughing only when eating.  Swallow  evaluation ordered.    Assessment / Plan / Recommendation Clinical Impression  Pt presents with clinical signs of multifactorial dysphagia - baseline deficits secondary to spinocerebellar ataxia - likely exacerbated due to acute illness/deconditioning.  Multiple swallows and pt complaining of pharyngeal residuals with applesauce.  Pt denies problems with liquids but admits to residue with applesauce.    After water consumption to clear applesauce residuals, pt expectorated viscous secretions (did not appear esophageal in nature).  Uncertain if pt with secretions retained in pharynx or possible esophageal residuals with referent sensation to pharynx as source.   Pt admitted to reflux/heart burn after last admit, but states it went away without medication.  "Heartburn" noted x1 during today's session with applesauce.    Pt educated that aspiration not preventable fully due to his dyspnea and dysphagia due to spinocerebellar ataxia.  Highly suspect recurrent episodic aspiration.     Pt admits poor intake due to lack of appetite and work of breathing even prior to admission.   To honor pt's wishes and as pt is aware of his dysphagia, recommend continue regular/thin with with precautions.    Note plan for palliative consult = Thank you as pt with Increased recurrent pna risk with aspiration due to COPD/deconditioning.  Educated with pt completed using MBS video review and verbal/written instructions.  SLP to follow for dysphagia management.  SLP Visit Diagnosis: Dysphagia, pharyngoesophageal phase (  R13.14)    Aspiration Risk  Moderate aspiration risk;Risk for inadequate nutrition/hydration    Diet Recommendation Regular;Thin liquid   Liquid Administration via: Cup Medication Administration: Other (Comment) (prefer with puree, start and follow with liquids) Supervision: Patient able to self feed Compensations: Minimize environmental distractions;Slow rate;Small sips/bites;Multiple dry swallows  after each bite/sip;Other (Comment);Follow solids with liquid (hock and intermittent dry swallow)    Other  Recommendations Oral Care Recommendations: Oral care BID   Follow up Recommendations   TBD     Frequency and Duration min 2x/week  2 weeks       Prognosis Prognosis for Safe Diet Advancement: Fair Barriers to Reach Goals: Time post onset      Swallow Study   General Date of Onset: 07/14/16 HPI: 61 yo male adm to [facility] with congestion and recent treatment with ABX- failed treatment and still has Right lower lobe pna.   PMH + for COPD, candida esophagitis, spinocerebellar ataxia, Raynaud's, tobacco use.  Pt previously has undergone MBSs x 2 - 2016  and 06/2016 with findings of Pt denies problems swallowing, admits to issues with cough x 2 weeks with weakness and poor mobility, weight loss.  Current weight is 90 pounds and during last visit 06/23/16 he was 97 pounds.  Pt denies dysphagia but admits to coughing only when eating.  Swallow evaluation ordered.  Type of Study: Bedside Swallow Evaluation Diet Prior to this Study: Regular;Thin liquids Temperature Spikes Noted: No Respiratory Status: Nasal cannula History of Recent Intubation: No Behavior/Cognition: Alert;Cooperative;Pleasant mood Oral Cavity Assessment: Dry (? minimal coating on tongue) Oral Care Completed by SLP: No Oral Cavity - Dentition: Poor condition Vision: Functional for self-feeding Self-Feeding Abilities: Able to feed self Patient Positioning: Upright in bed Baseline Vocal Quality: Low vocal intensity;Breathy (pt only able to state 2-3 breathy words at a time) Volitional Cough: Strong (productive at times to viscous white tinged secretions ) Volitional Swallow: Able to elicit    Oral/Motor/Sensory Function Overall Oral Motor/Sensory Function:  (mild dysarthria, generally weak)   Ice Chips Ice chips: Not tested Other Comments: pt does not like ice chips   Thin Liquid Thin Liquid: Impaired Presentation:  Cup;Self Fed Pharyngeal  Phase Impairments: Cough - Immediate;Cough - Delayed    Nectar Thick Nectar Thick Liquid: Not tested   Honey Thick Honey Thick Liquid: Not tested   Puree Puree: Impaired Presentation: Self Fed;Spoon Oral Phase Functional Implications: Other (comment) Pharyngeal Phase Impairments: Multiple swallows Other Comments: c/o residuals in pharynx, requesting water to drink - when cued to cough, pt expectorated viscous secretions (did not appear esophageal in nature)    Solid   GO   Solid: Not tested        Macario Golds 07/14/2016,  Haani Bakula, [facility] [place]

## 2016-07-14 NOTE — Progress Notes (Signed)
Name: Warren [name] MRN: [id number] DOB: June 07, 1955    ADMISSION DATE:  07/13/2016 CONSULTATION DATE:  4/5  REFERRING MD :  Dr. Wynelle Cleveland  CHIEF COMPLAINT:  Dyspnea  HISTORY OF PRESENT ILLNESS:  61 year old male with PMH of COPD (on daily prednisone and home O2)  followed by Dr. Lamonte Sakai, cerebellar ataxia, and frequent pneumonias. He has a history of dysphagia secondary to spino - cerebellar ataxia and was recently admitted with RLL PNA thought secondary to aspiration. 4/5 he presented to PCP with complaints of progressive dyspnea with associated cough and congestion. Cough is productive for yellow sputum. He was referred to the emergency department where he was noted to be in respiratory distress initially requiring BiPAP. He improved with BiPAP and was weaned to 4L [place]. CXR concerning for persistent PNA. He was admitted to the hospitalist team and treated for HCAP with broad spectrum antibiotics, COPD exacerbation with IV steroids and nebulized bronchodilators. PCCM has been asked to see for further evaluation.  He was on 4 L nasal cannula in the ED and on transfer to stepdown unit, noted to desaturate to the 80s and placed on a nonrebreather  SIGNIFICANT EVENTS  3/15 > 3/19 - admit for CAP vs aspiration  STUDIES:  06/23/16 swallow >>mod dysphagia  SUBJECTIVE:   Much improved, able to lie supine  did not tolerate Bipap Afebrile, weak cough  VITAL SIGNS: Temp:  [98.1 F (36.7 C)-98.7 F (37.1 C)] 98.5 F (36.9 C) (04/06 0800) Pulse Rate:  [49-132] 125 (04/06 1000) Resp:  [18-32] 26 (04/06 1000) BP: (64-159)/(45-93) 106/67 (04/06 1000) SpO2:  [83 %-100 %] 96 % (04/06 1000) FiO2 (%):  [55 %] 55 % (04/05 2000) Weight:  [90 lb 6.2 oz (41 kg)-90 lb 9.6 oz (41.1 kg)] 90 lb 6.2 oz (41 kg) (04/05 1720)  PHYSICAL EXAMINATION:  Gen. Cachectic, in mod distress, anxious affect ENT - no thrush, no post nasal drip Neck: No JVD, no thyromegaly, no carotid bruits Lungs:  accessory muscles +, no  dullness to percussion, decreased without rales or rhonchi  Cardiovascular: Rhythm regular, heart sounds  normal, no murmurs or gallops, no peripheral edema Abdomen: soft and non-tender, no hepatosplenomegaly, BS normal. Musculoskeletal: No deformities, no cyanosis or clubbing Neuro:  alert, non focal, diffuse muscle wasting    Recent Labs Lab 07/13/16 1127 07/13/16 1314 07/14/16 0340  NA 132* 133* 132*  K 4.4 4.5 4.3  CL 97* 98* 101  CO2 26 26 23   BUN 20 21* 18  CREATININE 0.79 0.80 0.77  GLUCOSE 108* 98 121*    Recent Labs Lab 07/13/16 1127 07/13/16 1314 07/14/16 0340  HGB 10.8* 11.5* 9.3*  HCT 32.1* 33.8* 27.5*  WBC 20.8* 24.4* 19.3*  PLT 339 324 286   Dg Chest 2 View  Result Date: 07/13/2016 CLINICAL DATA:  Recent pneumonia.  Shortness of breath. EXAM: CHEST  2 VIEW COMPARISON:  06/22/2016 FINDINGS: The lungs are hyperinflated likely secondary to COPD. Bilateral chronic interstitial lung disease. Hazy lingular airspace disease concerning for pneumonia. Near complete resolution of right lower lobe airspace disease with a small area of persistent right middle lobe airspace disease. Small bilateral pleural effusions. The heart and mediastinal contours are unremarkable. The osseous structures are unremarkable. IMPRESSION: Mild persistent right middle lobe and lingular airspace disease concerning for persistent pneumonia. Followup PA and lateral chest X-ray is recommended in 3-4 weeks following trial of antibiotic therapy to ensure resolution and exclude underlying malignancy. Electronically Signed   By: Elbert Ewings  Patel   On: 07/13/2016 12:11    ASSESSMENT / PLAN:  Feel that this episode is related to aspiration, he was recently admitted 3/15 and treated with antibiotics. His underlying neurodegenerative disease seems to be progressing. For the last 4 weeks he has not been ambulatory, his swallowing has been affected and his voice is very weak. All this suggests that he has  some  degree of chronic respiratory failure. He has simply not recovered from his last pneumonia  Recurrent pneumonia, favor aspiration, dysphagia Acute respiratory failure Underlying COPD  Recommend-  Did not tolerate NIV Mucinex + chest vest bid for chest PT  Empiric antibiotics will be continued for HCAP  Swallow precautions   DO NOT RESUSCITATE issued Long term prognosis guarded  PCCM available as needed  Kara Mead MD. FCCP. [place] Pager [phone] If no response call [phone]   07/14/2016      07/14/2016, 10:53 AM

## 2016-07-14 NOTE — Evaluation (Signed)
Physical Therapy Evaluation Patient Details Name: Warren [name] MRN: [id number] DOB: 12-05-1955 Today's Date: 07/14/2016   History of Present Illness  Pt is a 61 year old male with PMHx significant for COPD, L hip fx, cerebellar ataxia, and frequent pneumonias. Pt also with a history of dysphagia secondary to the cerebellar ataxia and was recently admitted with RLL PNA thought secondary to aspiration and admitted 07/13/16 for recurrent pneumonia  Clinical Impression  Pt admitted with above diagnosis. Pt currently with functional limitations due to the deficits listed below (see PT Problem List).  Pt will benefit from skilled PT to increase their independence and safety with mobility to allow discharge to the venue listed below.  Pt assisted with ambulating short distance however limited by dyspnea and elevated HR.  Pt currently requiring 6L O2 [place].  Pt with recent admission and discharged home with family support, HHPT, and oxygen.      Follow Up Recommendations Home health PT;Supervision for mobility/OOB    Equipment Recommendations  None recommended by PT    Recommendations for Other Services       Precautions / Restrictions Precautions Precautions: Fall Precaution Comments: monitor HR and O2 Restrictions Weight Bearing Restrictions: No      Mobility  Bed Mobility Overal bed mobility: Needs Assistance Bed Mobility: Supine to Sit;Sit to Supine     Supine to sit: Min guard;HOB elevated Sit to supine: Min guard   General bed mobility comments: min/guard for safety, pt able to perform with increased time and effort  Transfers Overall transfer level: Needs assistance Equipment used: Rolling walker (2 wheeled) Transfers: Sit to/from Stand Sit to Stand: Min assist         General transfer comment: cues for hand placement and safety  Ambulation/Gait Ambulation/Gait assistance: Min guard Ambulation Distance (Feet): 18 Feet Assistive device: Rolling walker (2 wheeled) Gait  Pattern/deviations: Step-through pattern;Decreased stride length;Narrow base of support     General Gait Details: pt desired to ambulate, HR elevated to 141 bpm, RR33, Spo2 remained >90% on 6L O2 [place], very slow pace and frequent standing breaks for breathing  Stairs            Wheelchair Mobility    Modified Rankin (Stroke Patients Only)       Balance Overall balance assessment: History of Falls;Needs assistance         Standing balance support: Bilateral upper extremity supported Standing balance-Leahy Scale: Poor Standing balance comment: reliant on UE support for static balance                             Pertinent Vitals/Pain Pain Assessment: No/denies pain    Home Living Family/patient expects to be discharged to:: Private residence Living Arrangements: Spouse/significant other   Type of Home: House Home Access: Stairs to enter   [employer] of Steps: 3 Home Layout: One level Home Equipment: Cane - single point;Walker - 2 wheels Additional Comments: plans to go to sons house upon recent discharge --no stairs, one level, stated he received PT at home and was ambulating 30 feet with RW    Prior Function Level of Independence: Independent with assistive device(s);Needs assistance   Gait / Transfers Assistance Needed: amb with RW short distance  ADL's / Homemaking Assistance Needed: wife assists with ADLs        Hand Dominance        Extremity/Trunk Assessment   Upper Extremity Assessment Upper Extremity Assessment: Overall WFL for  tasks assessed    Lower Extremity Assessment Lower Extremity Assessment: Overall WFL for tasks assessed (diffuse muscle atrophy throughout)    Cervical / Trunk Assessment Cervical / Trunk Assessment: Kyphotic  Communication   Communication: No difficulties (soft spoken and difficult to understand at times)  Cognition Arousal/Alertness: Awake/alert Behavior During Therapy: WFL for tasks  assessed/performed Overall Cognitive Status: Within Functional Limits for tasks assessed                                        General Comments      Exercises     Assessment/Plan    PT Assessment Patient needs continued PT services  PT Problem List Decreased strength;Decreased range of motion;Decreased activity tolerance;Decreased balance;Decreased knowledge of use of DME;Decreased mobility;Cardiopulmonary status limiting activity       PT Treatment Interventions DME instruction;Gait training;Functional mobility training;Therapeutic activities;Therapeutic exercise;Patient/family education    PT Goals (Current goals can be found in the Care Plan section)  Acute Rehab PT Goals PT Goal Formulation: With patient Time For Goal Achievement: 07/21/16 Potential to Achieve Goals: Good    Frequency Min 3X/week   Barriers to discharge        Co-evaluation               End of Session Equipment Utilized During Treatment: Gait belt;Oxygen Activity Tolerance: Patient limited by fatigue Patient left: with call bell/phone within reach;in bed   PT Visit Diagnosis: Other abnormalities of gait and mobility (R26.89);Muscle weakness (generalized) (M62.81)    Time: 7416-3845 PT Time Calculation (min) (ACUTE ONLY): 25 min   Charges:   PT Evaluation $PT Eval Moderate Complexity: 1 Procedure     PT G Codes:        Carmelia Bake, PT, DPT 07/14/2016 Pager: [phone]   York Ram E 07/14/2016, 11:43 AM

## 2016-07-14 NOTE — Progress Notes (Signed)
CSW consulted due to COPD GOLD protocol. Past admissions reviewed. Pt does not meet criteria for CODP Gold protocol.  CSW signing off.  Werner Lean LCSW [phone]

## 2016-07-14 NOTE — Progress Notes (Signed)
OT Cancellation Note  Patient Details Name: DERIUS GHOSH MRN: [id number] DOB: Oct 08, 1955   Cancelled Treatment:    Reason Eval/Treat Not Completed: Patient not medically ready.  Pt's resting HR is in the 120s in bed. Will check back tomorrow.  Keishawna Carranza 07/14/2016, 2:58 PM  Lesle Chris, OTR/L [phone] 07/14/2016

## 2016-07-14 NOTE — Progress Notes (Signed)
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  cxr on this  admission: Mild persistent right middle lobe and lingular airspace disease concerning for persistent pneumonia. Followup PA and lateral chest X-ray is recommended in 3-4 weeks following trial of antibiotic therapy to ensure resolution and exclude underlying malignancy"  Wbc 24 , lactic acid 2.67, hypoxic, tachycardia, brief hypotension, sbp in the 70-80's initially, blood culture no growth, ua no infection. mrsa screening negative, sputum pending collection.  He is admitted to stepdown due to desats to the 80's while on 4liter oxygen, he was put on nonrebreather,   he has improved,  Now on 6liter. Sputum culture pending collection, continue cefepime /steroid /nebs/chest vest, add flagyl to cover anaerobes  Pulmonology critical care input appreciated  Speech eval:  Moderate aspiration risk;Risk for inadequate nutrition/hydration     Diet Recommendation Regular;Thin liquid   Liquid Administration via: Cup Medication Administration: Other (Comment) (prefer with puree, start and follow with liquids) Supervision: Patient able to self feed Compensations:  Minimize environmental distractions;Slow rate;Small sips/bites;Multiple dry swallows after each bite/sip;Other (Comment);Follow solids with liquid (hock and intermittent dry swallow)    Mild hyponatremia: sodium 132-133, monitor  Chronic combined systolic and diastolic CHF (congestive heart failure)  -Per echo in 4/16 EF 45-50% and grade 1 diastolic dysfunction - repeat 2-D echo pending - He is received a liter of normal saline in the ER-we'll hold off on giving him further fluids and monitor I&O closely Clinically dry  V. tach noted on last admission - Continue metoprolol, keep K>4, mag >2  Spinocerebellar ataxia type VI Progressive, with gait disorder, dysphagia, dysarthria,  For the last 4 weeks he has not been ambulatory, his voice is very weak  Severe malnutrition in context of acute illness/injury, Severe malnutrition in context of chronic illness, Underweight Physical assessment shows severe muscle and severe fat wasting. Per chart review, pt has lost 9 lbs (9.1% body weight) in the past 1 month which is significant for time frame. Nutrition input appreciated  FTT: with progressive neurodegenerative disease and recurrent pneumonia/copd exacerbation, progressive weight loss Palliative care consulted, input appreciated  Over all prognosis is poor.  Code Status: DNR  Family Communication: patient   Disposition Plan: home with home health   Consultants:  Pulmonology /critical care  Palliative care  Procedures:  NRB  Antibiotics:  vancx1 I nthe ED  Cefepime from admission  Flagyl from 4/6   Objective: BP 100/65   Pulse (!) 106   Temp 98.7 F (37.1 C) (Oral)   Resp (!) 22   Ht 5\' 7"  (1.702 m)   Wt 41 kg (90 lb 6.2 oz)   SpO2 96%   BMI 14.16 kg/m   Intake/Output Summary (Last 24 hours) at 07/14/16 0808 Last data filed at 07/14/16 0556  Gross per 24 hour  Intake  1410 ml  Output              400 ml  Net             1010 ml   Filed  Weights   07/13/16 1720  Weight: 41 kg (90 lb 6.2 oz)    Exam:   General:  Frail, chronically ill, very thin, NAD  Cardiovascular:less sinus tachycardia   Respiratory: decreased, some rhonchi, no wheezing  Abdomen: Soft/ND/NT, positive BS  Musculoskeletal: No Edema  Neuro: aaox3  Data Reviewed: Basic Metabolic Panel:  Recent Labs Lab 07/13/16 1127 07/13/16 1314 07/14/16 0340  NA 132* 133* 132*  K 4.4 4.5 4.3  CL 97* 98* 101  CO2 26 26 23   GLUCOSE 108* 98 121*  BUN 20 21* 18  CREATININE 0.79 0.80 0.77  CALCIUM 8.2* 8.5* 8.1*   Liver Function Tests:  Recent Labs Lab 07/13/16 1127 07/13/16 1314  AST 15 26  ALT 13 16*  ALKPHOS 75 78  BILITOT 0.7 0.8  PROT 5.5* 6.4*  ALBUMIN 3.0* 3.2*   No results for input(s): LIPASE, AMYLASE in the last 168 hours. No results for input(s): AMMONIA in the last 168 hours. CBC:  Recent Labs Lab 07/13/16 1127 07/13/16 1314 07/14/16 0340  WBC 20.8* 24.4* 19.3*  NEUTROABS 19,344* 22.5*  --   HGB 10.8* 11.5* 9.3*  HCT 32.1* 33.8* 27.5*  MCV 95.3 94.2 93.9  PLT 339 324 286   Cardiac Enzymes:   No results for input(s): CKTOTAL, CKMB, CKMBINDEX, TROPONINI in the last 168 hours. BNP (last 3 results)  Recent Labs  06/22/16 1706 07/13/16 1127  BNP 254.1* 145.0*    ProBNP (last 3 results) No results for input(s): PROBNP in the last 8760 hours.  CBG: No results for input(s): GLUCAP in the last 168 hours.  Recent Results (from the past 240 hour(s))  MRSA PCR Screening     Status: None   Collection Time: 07/13/16  5:24 PM  Result Value Ref Range Status   MRSA by PCR NEGATIVE NEGATIVE Final    Comment:        The GeneXpert MRSA Assay (FDA approved for NASAL specimens only), is one component of a comprehensive MRSA colonization surveillance program. It is not intended to diagnose MRSA infection nor to guide or monitor treatment for MRSA infections.      Studies: Dg Chest 2 View  Result Date:  07/13/2016 CLINICAL DATA:  Recent pneumonia.  Shortness of breath. EXAM: CHEST  2 VIEW COMPARISON:  06/22/2016 FINDINGS: The lungs are hyperinflated likely secondary to COPD. Bilateral chronic interstitial lung disease. Hazy lingular airspace disease concerning for pneumonia. Near complete resolution of right lower lobe airspace disease with a small area of persistent right middle lobe airspace disease. Small bilateral pleural effusions. The heart and mediastinal contours are unremarkable. The osseous structures are unremarkable. IMPRESSION: Mild persistent right middle lobe and lingular airspace disease concerning for persistent pneumonia. Followup PA and lateral chest X-ray is recommended in 3-4 weeks following trial of antibiotic therapy to ensure resolution and exclude underlying malignancy. Electronically Signed   By: Kathreen Devoid   On: 07/13/2016 12:11    Scheduled Meds: . ceFEPime (MAXIPIME) IV  1 g Intravenous Q8H  . dextromethorphan-guaiFENesin  1 tablet Oral BID  . enoxaparin (LOVENOX) injection  30 mg Subcutaneous Q24H  . ipratropium-albuterol  3 mL Nebulization Q6H  . lactose free nutrition  237 mL Oral TID WC  . methylPREDNISolone (SOLU-MEDROL) injection  40 mg Intravenous  Q8H  . metoprolol succinate  12.5 mg Oral QHS    Continuous Infusions:   Time spent: 31mins  Jaya Lapka MD, PhD  [facility] Pager [phone]. If 7PM-7AM, please contact night-coverage at [email or web address], password [facility] 07/14/2016, 8:08 AM  LOS: 1 day

## 2016-07-14 NOTE — Consult Note (Signed)
Consultation Note Date: 07/14/2016   Patient Name: Warren [name]  DOB: July 21, 1955  MRN: [id number]  Age / Sex: 61 y.o., male  PCP: Warren [name], [place] Referring Physician: Florencia Reasons, MD  Reason for Consultation: Establishing goals of care  HPI/Patient Profile: 61 y.o. male  with past medical history of  COPD and cerebellar ataxia admitted on 07/13/2016 with PNA .   Clinical Assessment and Goals of Care:  61 year old gentleman who lives with his wife in his son's house, has a history of chronic obstructive pulmonary disease, is on daily prednisone and home oxygen use and is followed by Warren [name], history of cerebellar ataxia, history of frequent pneumonias requiring hospitalization. His recent hospitalization prior to this current one was in March 2018. Also has a history of discharge that is progressively worsening, also with suspected aspiration. Patient has been admitted to the hospital with complaints of progressive dyspnea, cough and congestion. Patient has been admitted again with recurrent pneumonia, favoring aspiration. 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 Chart review that patient was noted to have cerebellar a trophy in MRI of the brain from 2015.  A palliative consult has been placed for goals of care discussions.  Pulmonary/critical care medicine note reviewed. Patient seen and examined. He is a thin cachectic elderly-appearing gentleman sitting up by the side of the bed. He is able to cough and expectorate some. He has difficulty talking, is having difficulty swallowing. There is no family present at the bedside.  Call placed and discussed with patient's wife Warren [name] who referred me to her son Warren [name]. She states that Warren [name] can have any and all information pertaining to the patient's current condition. Call placed and discussed with son  Warren [name] at [phone]. Warren [name] is thankful for the information he has received from Dr. Elsworth Soho on 07-13-16.  I introduced myself and palliative care as follows: Palliative medicine is specialized medical care for people living with serious illness. It focuses on providing relief from the symptoms and stress of a serious illness. The goal is to improve quality of life for both the patient and the family.  We discussed about the patient's goals and wishes. I endorsed that I would completely agree with DO NOT RESUSCITATE/DO NOT INTUBATE. I discussed frankly with Warren [name] about the patient's ongoing decline from both cerebellar ataxia-neurodegenerative disease progression as well as ongoing decline from COPD/recurrent pneumonia. Discussed that the patient will likely need hospice services in the near future. See recommendations below. Thank you for the consult.  NEXT OF KIN  wife and son.  Patient and his wife live with their son Warren [name].   SUMMARY OF RECOMMENDATIONS   1. Agree with DNR DNI.  2. Family prefers to maximize medical interventions in hopes of stabilizing the patient's current condition 3.Family prefers SNF rehab attempt towards the end of this hospitalization, they are agreeable to palliative services following the patient at SNF 4. Family understands that the patient continues to gradually progressively decline from both pulmonary and brain serious illnesses  and has a high likelihood of decline and eventual death. I have also discussed with them about hospice philosophy of care. Continue to monitor hospital trajectory to help guide further decision making.   Thank you for the consult.    Code Status/Advance Care Planning:  DNR    Symptom Management:    as above   Palliative Prophylaxis:   Bowel Regimen     Psycho-social/Spiritual:   Desire for further Chaplaincy support:no  Additional Recommendations: Caregiving  Support/Resources  Prognosis:   Guarded, ? Few months.    Discharge Planning: [facility] for rehab with Palliative care service follow-up, as requested by family.      Primary Diagnoses: Present on Admission: . HCAP (healthcare-associated pneumonia) . COPD exacerbation ([place]) . Chronic combined systolic and diastolic CHF (congestive heart failure) ([place]) . Protein-calorie malnutrition, severe . 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 Loss of weight . Hyponatremia . Dysphagia, pharyngoesophageal phase   I have reviewed the medical record, interviewed the patient and family, and examined the patient. The following aspects are pertinent.  Past Medical History:  Diagnosis Date  . Abnormal MRI of head   . Allergic rhinitis   . Candidal esophagitis ([facility])    Dr. Benson Norway 10/2012  . COPD (chronic obstructive pulmonary disease) ([place])   . Dysarthria 02/16/2014  . Dysphagia, pharyngoesophageal phase 06/15/2014  . 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 Osteoporosis 04/10/2009   L hip fracture; T score -4.2 in 2012.  Fosamax for one year.  . Pneumonia   . Raynaud disease 06/15/2014   Bilateral hands  . 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 Testicular atrophy    Right   Social History   Social History  . Marital status: Married    Spouse name: N/A  . Number of children: 1  . Years of education: BS   Occupational History  . 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 Smoking status: Former Smoker    Packs/day: 1.50    Years: 30.00    Quit date: 07/25/2014  . Smokeless tobacco: Never Used     Comment: pt says he is smoking 5 or 6 cigarettes daily  . Alcohol use No     Comment: 2 beers monthly  . Drug use: No  . Sexual activity: No   Other Topics Concern  . None   Social History Narrative   Marital status: married x 31 years.      Children:  One child (25); no grandchildren.      Lives: with wife, son.  Has a barn; cats.      Employment: unemployed in 2015; previous truck driver x 30 years;  hauls beer.  Drives locally     Tobacco: 1 ppd x 30 years      Alcohol:  Rare/none      Drugs: none      Exercise: sporadic      Seatbelt:  100%      Guns:  Unloaded.   Patient is right handed.   Patient drinks 3-4 cups of caffeine daily.   Family History  Problem Relation Age of Onset  . Heart disease Father     valve replacement; CHF; heart transplant candidate  . Hypertension Mother   . Hyperlipidemia Mother   . Diabetes Mother   . Stroke Mother 48    cause of death.  . Diabetes Sister   . Hyperlipidemia Sister   . Stroke Brother  Scheduled Meds: . ceFEPime (MAXIPIME) IV  1 g Intravenous Q8H  . enoxaparin (LOVENOX) injection  30 mg Subcutaneous Q24H  . feeding supplement (ENSURE ENLIVE)  237 mL Oral TID BM  . guaiFENesin  600 mg Oral BID  . ipratropium-albuterol  3 mL Nebulization Q6H  . mouth rinse  15 mL Mouth Rinse BID  . methylPREDNISolone (SOLU-MEDROL) injection  40 mg Intravenous Q12H  . metoprolol succinate  12.5 mg Oral QHS  . multivitamin with minerals  1 tablet Oral Daily   Continuous Infusions: PRN Meds:.acetaminophen **OR** acetaminophen, fluticasone, hydrOXYzine, levalbuterol, ondansetron **OR** ondansetron (ZOFRAN) IV, senna-docusate Medications Prior to Admission:  Prior to Admission medications   Medication Sig Start Date End Date Taking? Authorizing Provider  albuterol (PROVENTIL HFA;VENTOLIN HFA) 108 (90 Base) MCG/ACT inhaler Inhale 2 puffs into the lungs as needed for wheezing or shortness of breath. Patient taking differently: Inhale 2 puffs into the lungs every 6 (six) hours as needed for wheezing or shortness of breath.  03/23/16  Yes Collene Gobble, MD  albuterol (PROVENTIL) (2.5 MG/3ML) 0.083% nebulizer solution Take 3 mLs (2.5 mg total) by nebulization every 6 (six) hours as needed for wheezing or shortness of breath. 07/09/16  Yes Warren Muta, FNP  budesonide-formoterol [facility]) 160-4.5 MCG/ACT inhaler Inhale 2 puffs into the  lungs 2 (two) times daily. 06/13/16  Yes Juanito Doom, MD  dextromethorphan-guaiFENesin [facility] DM) 30-600 MG 12hr tablet Take 1 tablet by mouth daily.   Yes Historical Provider, MD  fluticasone (FLONASE) 50 MCG/ACT nasal spray Place 2 sprays into both nostrils daily as needed for rhinitis.    Yes Historical Provider, MD  hydrOXYzine (VISTARIL) 50 MG capsule Take 1-2 capsules (50-100 mg total) by mouth 2 (two) times daily as needed for anxiety. 07/04/16  Yes Warren Muta, FNP  ibuprofen (ADVIL,MOTRIN) 200 MG tablet Take 400 mg by mouth every 6 (six) hours as needed for mild pain or moderate pain.   Yes Historical Provider, MD  ipratropium (ATROVENT) 0.02 % nebulizer solution Take 0.5 mg by nebulization every 6 (six) hours as needed for wheezing or shortness of breath.   Yes Historical Provider, MD  metoprolol succinate (TOPROL XL) 25 MG 24 hr tablet Take 0.5 tablets (12.5 mg total) by mouth daily. Patient taking differently: Take 12.5 mg by mouth at bedtime.  06/26/16  Yes Nita Sells, MD  predniSONE (DELTASONE) 20 MG tablet Take 2 tablets (40 mg total) by mouth daily before breakfast. 07/10/16  Yes Collene Gobble, MD  LORazepam (ATIVAN) 0.5 MG tablet Take 1 tablet (0.5 mg total) by mouth 2 (two) times daily. Patient not taking: Reported on 07/13/2016 06/26/16   Nita Sells, MD  megestrol (MEGACE ES) 625 MG/5ML suspension Take 5 mLs (625 mg total) by mouth daily. Patient not taking: Reported on 07/13/2016 07/04/16   Warren Muta, FNP   Allergies  Allergen Reactions  . Contrast Media [Iodinated Diagnostic Agents] Hives and Itching  . Iodine Hives and Itching   Review of Systems + cough, is able to expectorate, shortness of breath  Physical Exam Gen. Cachectic, elderly appearing, appears anxious  Lungs:  accessory muscles +,  decreased without rales or rhonchi  Cardiovascular: Rhythm regular, heart sounds  normal, no murmurs or gallops, no peripheral  edema Abdomen: soft and non-tender, no hepatosplenomegaly, BS normal. Musculoskeletal: No deformities, no cyanosis or clubbing Neuro:  alert, non focal, has diffuse muscle wasting   Vital Signs: BP 106/67   Pulse (!) 125   Temp  98.5 F (36.9 C) (Oral)   Resp (!) 26   Ht 5\' 7"  (1.702 m)   Wt 41 kg (90 lb 6.2 oz)   SpO2 96%   BMI 14.16 kg/m  Pain Assessment: No/denies pain   Pain Score: 2    SpO2: SpO2: 96 % O2 Device:SpO2: 96 % O2 Flow Rate: .O2 Flow Rate (L/min): 6 L/min  IO: Intake/output summary:  Intake/Output Summary (Last 24 hours) at 07/14/16 1257 Last data filed at 07/14/16 0800  Gross per 24 hour  Intake             1530 ml  Output              775 ml  Net              755 ml    LBM: Last BM Date: 07/10/16 (per pt) Baseline Weight: Weight: 41 kg (90 lb 6.2 oz) Most recent weight: Weight: 41 kg (90 lb 6.2 oz)     Palliative Assessment/Data:   Flowsheet Rows     Most Recent Value  Intake Tab  Referral Department  Hospitalist  Unit at Time of Referral  Intermediate Care Unit  Palliative Care Primary Diagnosis  Other (Comment) [recurrent PNA neuro degenerative disease ]  Palliative Care Type  New Palliative care  Reason for referral  Clarify Goals of Care  Date first seen by Palliative Care  07/14/16  Clinical Assessment  Palliative Performance Scale Score  30%  Pain Max last 24 hours  5  Pain Min Last 24 hours  4  Dyspnea Max Last 24 Hours  6  Dyspnea Min Last 24 hours  4  Psychosocial & Spiritual Assessment  Palliative Care Outcomes  Patient/Family meeting held?  Yes  Who was at the meeting?  patient, wife and son over the phone.   Palliative Care Outcomes  Clarified goals of care      Time In:  12 Time Out:  1 Time Total: 60 minutes   Greater than 50%  of this time was spent counseling and coordinating care related to the above assessment and plan.  Signed by: Loistine Chance, MD [phone]   Please contact Palliative Medicine Team phone  at [phone] for questions and concerns.  For individual provider: See Shea Evans

## 2016-07-14 NOTE — Progress Notes (Addendum)
CSW consulted for SNF placement. PN reviewed. PT does not recommend SNF placement at this time. [employer] will not cover cost of SNF placement if not recommended by PT. CSW has updated pt's son. Pt is unable to afford pvt pay SNF placement. RNCM will assist with d/c planning.   Werner Lean LCSW [phone]

## 2016-07-14 NOTE — Progress Notes (Addendum)
Initial Nutrition Assessment  DOCUMENTATION CODES:   Severe malnutrition in context of acute illness/injury, Severe malnutrition in context of chronic illness, Underweight  INTERVENTION:  - Will d/c Boost Plus. - Will order Ensure Enlive TID, each supplement provides 350 kcal and 20 grams of protein - Will order daily multivitamin with minerals. - RD will continue to monitor for additional needs. - Recommend addition of Marinol or Megace during admission.   NUTRITION DIAGNOSIS:   Malnutrition related to acute illness, chronic illness as evidenced by percent weight loss, severe depletion of body fat, severe depletion of muscle mass.  GOAL:   Patient will meet greater than or equal to 90% of their needs, Weight gain  MONITOR:   PO intake, Supplement acceptance, Weight trends, Labs  REASON FOR ASSESSMENT:   Malnutrition Screening Tool, Consult Assessment of nutrition requirement/status  ASSESSMENT:   61 y.o. male with medical history significant for COPD on 2 L o2 at home follows with Dr Lamonte Sakai,  Spinocerebellar ataxia, Rayauds disease, recently admitted for pneumonia which was right lower lobe and suspected to be aspiration. He received a total of 7 days of treatment, finishing up with Augmentin at home. He saw his PCP on day of admission. According to family symptoms of cough and congestion improved however slowly began to recur with resultant shortness of breath as well. He is coughing up yellowish brown mucus. He presents to Hospital in respiratory distress and initially required BiPAP.   Pt seen for MST and consult. BMI indicates underweight status. No intakes documented since admission. During visit pt requested RD order lunch: quesadilla with chicken and cheese, potato salad, black coffee, and Sprite. He was very SOB during visit so visit was short and much of the information was obtained from the chart and confirmed by the pt with head nods. Pt was recently admitted and  prescribed Megace; after d/c he was unable to obtain this medication d/t inability to afford it and insurance not covering it. Pt was drinking chocolate Ensure through a straw at home. He tried [employer] while working with SLP earlier and states that this supplement is "so-so." Will order Ensure as it provides more kcal and protein and is one that pt enjoys.   Physical assessment shows severe muscle and severe fat wasting. Per chart review, pt has lost 9 lbs (9.1% body weight) in the past 1 month which is significant for time frame.  Medications reviewed; 40 mg Solu-medrol BID, 1 tablet Senokot/day. Labs reviewed; Na: 132 mmol/L, Ca: 8.1 mg/dL.   Diet Order:  Diet regular Room service appropriate? Yes; Fluid consistency: Thin  Skin:  Reviewed, no issues  Last BM:  4/6  Height:   Ht Readings from Last 1 Encounters:  07/13/16 5\' 7"  (1.702 m)    Weight:   Wt Readings from Last 1 Encounters:  07/13/16 90 lb 6.2 oz (41 kg)    Ideal Body Weight:  67.27 kg  BMI:  Body mass index is 14.16 kg/m.  Estimated Nutritional Needs:   Kcal:  1435-1640 (35-40 kcal/kg)  Protein:  70-82 grams (1.7-2 grams/kg)  Fluid:  >/= 1.5 L/day  EDUCATION NEEDS:   No education needs identified at this time    Jarome Matin, MS, RD, LDN, CNSC Inpatient Clinical Dietitian Pager # [phone] After hours/weekend pager # [phone]

## 2016-07-15 LAB — CBC
HCT: 25 % — ABNORMAL LOW (ref 39.0–52.0)
Hemoglobin: 8.6 g/dL — ABNORMAL LOW (ref 13.0–17.0)
MCH: 31.9 pg (ref 26.0–34.0)
MCHC: 34.4 g/dL (ref 30.0–36.0)
MCV: 92.6 fL (ref 78.0–100.0)
PLATELETS: 237 10*3/uL (ref 150–400)
RBC: 2.7 MIL/uL — AB (ref 4.22–5.81)
RDW: 14.5 % (ref 11.5–15.5)
WBC: 12.3 10*3/uL — ABNORMAL HIGH (ref 4.0–10.5)

## 2016-07-15 LAB — COMPREHENSIVE METABOLIC PANEL
ALK PHOS: 65 U/L (ref 38–126)
ALT: 19 U/L (ref 17–63)
ANION GAP: 6 (ref 5–15)
AST: 23 U/L (ref 15–41)
Albumin: 2.7 g/dL — ABNORMAL LOW (ref 3.5–5.0)
BILIRUBIN TOTAL: 0.6 mg/dL (ref 0.3–1.2)
BUN: 16 mg/dL (ref 6–20)
CALCIUM: 8.4 mg/dL — AB (ref 8.9–10.3)
CO2: 28 mmol/L (ref 22–32)
CREATININE: 0.59 mg/dL — AB (ref 0.61–1.24)
Chloride: 97 mmol/L — ABNORMAL LOW (ref 101–111)
GFR calc non Af Amer: >60 mL/min (ref >60)
GLUCOSE: 141 mg/dL — AB (ref 65–99)
Potassium: 4.2 mmol/L (ref 3.5–5.1)
Sodium: 131 mmol/L — ABNORMAL LOW (ref 135–145)
TOTAL PROTEIN: 5.6 g/dL — AB (ref 6.5–8.1)

## 2016-07-15 LAB — EXPECTORATED SPUTUM ASSESSMENT W REFEX TO RESP CULTURE

## 2016-07-15 LAB — EXPECTORATED SPUTUM ASSESSMENT W GRAM STAIN, RFLX TO RESP C

## 2016-07-15 LAB — MAGNESIUM: Magnesium: 1.7 mg/dL (ref 1.7–2.4)

## 2016-07-15 MED ORDER — MAGNESIUM SULFATE 2 GM/50ML IV SOLN
2.0000 g | Freq: Once | INTRAVENOUS | Status: AC
Start: 2016-07-15 — End: 2016-07-15
  Administered 2016-07-15: 2 g via INTRAVENOUS
  Filled 2016-07-15: qty 50

## 2016-07-15 MED ORDER — SODIUM CHLORIDE 0.9 % IV SOLN
INTRAVENOUS | Status: AC
Start: 2016-07-15 — End: 2016-07-16
  Administered 2016-07-16: 01:00:00 via INTRAVENOUS

## 2016-07-15 MED ORDER — SODIUM CHLORIDE 0.9 % IV BOLUS (SEPSIS)
500.0000 mL | Freq: Once | INTRAVENOUS | Status: AC
Start: 2016-07-15 — End: 2016-07-15
  Administered 2016-07-15: 500 mL via INTRAVENOUS

## 2016-07-15 NOTE — ED Provider Notes (Signed)
[facility] DEPT Provider Note   CSN: [id number] Arrival date & time: 07/13/16  1238     History   Chief Complaint Chief Complaint  Patient presents with  . Pneumonia    HPI Warren [name] is a 61 y.o. male.  Level V caveat for urgent need for intervention. 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  He was recently in the hospital from March 6 2 March 19 pneumonia. He has been doing better until the past 24 hours when he became more dyspneic and hypoxic. His family thinks the pneumonia has returned. Pulse ox have been low on initial evaluation. At baseline patient has problems with speech, motor skills, swallowing.      Past Medical History:  Diagnosis Date  . Abnormal MRI of head   . Allergic rhinitis   . Candidal esophagitis (HCC)    Dr. Benson Norway 10/2012  . COPD (chronic obstructive pulmonary disease) ([place])   . Dysarthria 02/16/2014  . Dysphagia, pharyngoesophageal phase 06/15/2014  . 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 Osteoporosis 04/10/2009   L hip fracture; T score -4.2 in 2012.  Fosamax for one year.  . Pneumonia   . Raynaud disease 06/15/2014   Bilateral hands  . 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 Testicular atrophy    Right    Patient Active Problem List   Diagnosis Date Noted  . Malnutrition of moderate degree 07/14/2016  . HCAP (healthcare-associated pneumonia) 07/13/2016  . Acute on chronic respiratory failure with hypoxia ([place]) 07/13/2016  . Protein-calorie malnutrition, severe 06/23/2016  . Chronic combined systolic and diastolic CHF (congestive heart failure) ([place]) 06/22/2016  . Hypokalemia 06/22/2016  . Acute respiratory failure with hypoxia ([place]) 06/22/2016  . Chest pain 07/25/2014  . 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 Dysphagia, pharyngoesophageal phase 06/15/2014  . Raynaud disease 06/15/2014  . Dysarthria 02/16/2014  . 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 BPH (benign prostatic hyperplasia) 09/28/2013  .  Personal history of colonic polyps 12/02/2012  . Pneumonia 09/29/2012  . Hyponatremia 09/29/2012  . Loss of weight 09/29/2012  . Anemia 09/17/2012  . COPD exacerbation ([place]) 12/21/2010  . Tobacco abuse 12/21/2010    Past Surgical History:  Procedure Laterality Date  . CATARACT EXTRACTION     BOTH EYES  . COLONOSCOPY W/ POLYPECTOMY  12/07/2006   three polyps sigmoid.  [name]. Repeat 3 years.  . COLONOSCOPY W/ POLYPECTOMY  09/24/2012   five sessile polyps removed.  Internal and external hemorrhoids.  . egd  09/24/2012   hiatal hernia; candidal esophagitis, hematin in stomach; no active source of bleeding.  Marland Kitchen EYE SURGERY  04/10/2014   B cataract surgery  . INGUINAL HERNIA REPAIR     bilateral  . NASAL FRACTURE SURGERY    . VASECTOMY         Home Medications    Prior to Admission medications   Medication Sig Start Date End Date Taking? Authorizing Provider  albuterol (PROVENTIL HFA;VENTOLIN HFA) 108 (90 Base) MCG/ACT inhaler Inhale 2 puffs into the lungs as needed for wheezing or shortness of breath. Patient taking differently: Inhale 2 puffs into the lungs every 6 (six) hours as needed for wheezing or shortness of breath.  03/23/16  Yes Collene Gobble, MD  albuterol (PROVENTIL) (2.5 MG/3ML) 0.083% nebulizer solution Take 3 mLs (2.5 mg total) by nebulization every 6 (six) hours as needed for wheezing or shortness of breath. 07/09/16  Yes Sedalia Muta, FNP  budesonide-formoterol [facility]) 160-4.5 MCG/ACT inhaler Inhale  2 puffs into the lungs 2 (two) times daily. 06/13/16  Yes Juanito Doom, MD  dextromethorphan-guaiFENesin [facility] DM) 30-600 MG 12hr tablet Take 1 tablet by mouth daily.   Yes Historical Provider, MD  fluticasone (FLONASE) 50 MCG/ACT nasal spray Place 2 sprays into both nostrils daily as needed for rhinitis.    Yes Historical Provider, MD  hydrOXYzine (VISTARIL) 50 MG capsule Take 1-2 capsules (50-100 mg total) by mouth 2 (two) times daily as needed for  anxiety. 07/04/16  Yes Sedalia Muta, FNP  ibuprofen (ADVIL,MOTRIN) 200 MG tablet Take 400 mg by mouth every 6 (six) hours as needed for mild pain or moderate pain.   Yes Historical Provider, MD  ipratropium (ATROVENT) 0.02 % nebulizer solution Take 0.5 mg by nebulization every 6 (six) hours as needed for wheezing or shortness of breath.   Yes Historical Provider, MD  metoprolol succinate (TOPROL XL) 25 MG 24 hr tablet Take 0.5 tablets (12.5 mg total) by mouth daily. Patient taking differently: Take 12.5 mg by mouth at bedtime.  06/26/16  Yes Nita Sells, MD  predniSONE (DELTASONE) 20 MG tablet Take 2 tablets (40 mg total) by mouth daily before breakfast. 07/10/16  Yes Collene Gobble, MD  LORazepam (ATIVAN) 0.5 MG tablet Take 1 tablet (0.5 mg total) by mouth 2 (two) times daily. Patient not taking: Reported on 07/13/2016 06/26/16   Nita Sells, MD  megestrol (MEGACE ES) 625 MG/5ML suspension Take 5 mLs (625 mg total) by mouth daily. Patient not taking: Reported on 07/13/2016 07/04/16   Sedalia Muta, FNP    Family History Family History  Problem Relation Age of Onset  . Heart disease Father     valve replacement; CHF; heart transplant candidate  . Hypertension Mother   . Hyperlipidemia Mother   . Diabetes Mother   . Stroke Mother 49    cause of death.  . Diabetes Sister   . Hyperlipidemia Sister   . Stroke Brother     Social History Social History  Substance Use Topics  . Smoking status: Former Smoker    Packs/day: 1.50    Years: 30.00    Quit date: 07/25/2014  . Smokeless tobacco: Never Used     Comment: pt says he is smoking 5 or 6 cigarettes daily  . Alcohol use No     Comment: 2 beers monthly     Allergies   Contrast media [iodinated diagnostic agents] and Iodine   Review of Systems Review of Systems  Reason unable to perform ROS: Urgent need for intervention.     Physical Exam Updated Vital Signs BP 105/69   Pulse 93   Temp 97.9  F (36.6 C) (Oral)   Resp 18   Ht 5\' 7"  (1.702 m)   Wt 90 lb 6.2 oz (41 kg)   SpO2 97%   BMI 14.16 kg/m   Physical Exam  Constitutional:  Very thin, alert, tachypneic  HENT:  Head: Normocephalic and atraumatic.  Eyes: Conjunctivae are normal.  Neck: Neck supple.  Cardiovascular: Normal rate and regular rhythm.   Pulmonary/Chest:  Initially using accessory muscles.  Abdominal: Soft. Bowel sounds are normal.  Musculoskeletal: Normal range of motion.  Neurological: He is alert.  Skin: Skin is warm and dry.  Psychiatric: He has a normal mood and affect. His behavior is normal.  Nursing note and vitals reviewed.    ED Treatments / Results  Labs (all labs ordered are listed, but only abnormal results are displayed) Labs Reviewed  COMPREHENSIVE  METABOLIC PANEL - Abnormal; Notable for the following:       Result Value   Sodium 133 (*)    Chloride 98 (*)    BUN 21 (*)    Calcium 8.5 (*)    Total Protein 6.4 (*)    Albumin 3.2 (*)    ALT 16 (*)    All other components within normal limits  CBC WITH DIFFERENTIAL/PLATELET - Abnormal; Notable for the following:    WBC 24.4 (*)    RBC 3.59 (*)    Hemoglobin 11.5 (*)    HCT 33.8 (*)    Neutro Abs 22.5 (*)    All other components within normal limits  PROTIME-INR - Abnormal; Notable for the following:    Prothrombin Time 15.3 (*)    All other components within normal limits  URINALYSIS, ROUTINE W REFLEX MICROSCOPIC - Abnormal; Notable for the following:    Hgb urine dipstick SMALL (*)    Ketones, ur 5 (*)    All other components within normal limits  CBC - Abnormal; Notable for the following:    WBC 19.3 (*)    RBC 2.93 (*)    Hemoglobin 9.3 (*)    HCT 27.5 (*)    All other components within normal limits  BASIC METABOLIC PANEL - Abnormal; Notable for the following:    Sodium 132 (*)    Glucose, Bld 121 (*)    Calcium 8.1 (*)    All other components within normal limits  CBC - Abnormal; Notable for the following:      WBC 12.3 (*)    RBC 2.70 (*)    Hemoglobin 8.6 (*)    HCT 25.0 (*)    All other components within normal limits  COMPREHENSIVE METABOLIC PANEL - Abnormal; Notable for the following:    Sodium 131 (*)    Chloride 97 (*)    Glucose, Bld 141 (*)    Creatinine, Ser 0.59 (*)    Calcium 8.4 (*)    Total Protein 5.6 (*)    Albumin 2.7 (*)    All other components within normal limits  I-STAT CG4 LACTIC ACID, ED - Abnormal; Notable for the following:    Lactic Acid, Venous 2.67 (*)    All other components within normal limits  CULTURE, BLOOD (ROUTINE X 2)  CULTURE, BLOOD (ROUTINE X 2)  CULTURE, EXPECTORATED SPUTUM-ASSESSMENT  MRSA PCR SCREENING  LACTIC ACID, PLASMA  MAGNESIUM  I-STAT CG4 LACTIC ACID, ED    EKG  EKG Interpretation None       Radiology No results found.  Procedures Procedures (including critical care time)  Medications Ordered in ED Medications  ceFEPIme (MAXIPIME) 1 g in dextrose 5 % 50 mL IVPB (1 g Intravenous Given 07/15/16 0549)  fluticasone (FLONASE) 50 MCG/ACT nasal spray 2 spray (not administered)  metoprolol succinate (TOPROL-XL) 24 hr tablet 12.5 mg (12.5 mg Oral Given 07/14/16 2118)  enoxaparin (LOVENOX) injection 30 mg (30 mg Subcutaneous Given 07/14/16 2104)  acetaminophen (TYLENOL) tablet 650 mg (not administered)    Or  acetaminophen (TYLENOL) suppository 650 mg (not administered)  ondansetron (ZOFRAN) tablet 4 mg (not administered)    Or  ondansetron (ZOFRAN) injection 4 mg (not administered)  senna-docusate (Senokot-S) tablet 1 tablet (not administered)  ipratropium-albuterol (DUONEB) 0.5-2.5 (3) MG/3ML nebulizer solution 3 mL (3 mLs Nebulization Given 07/15/16 0904)  levalbuterol (XOPENEX) nebulizer solution 0.63 mg (not administered)  hydrOXYzine (ATARAX/VISTARIL) tablet 50 mg (50 mg Oral Given 07/14/16 1938)  MEDLINE mouth rinse (  15 mLs Mouth Rinse Given 07/15/16 0933)  methylPREDNISolone sodium succinate (SOLU-MEDROL) 40 mg/mL injection 40 mg  (40 mg Intravenous Given 07/15/16 0912)  guaiFENesin (MUCINEX) 12 hr tablet 600 mg (600 mg Oral Given 07/15/16 0913)  feeding supplement (ENSURE ENLIVE) (ENSURE ENLIVE) liquid 237 mL (237 mLs Oral Given 07/15/16 0932)  multivitamin with minerals tablet 1 tablet (1 tablet Oral Given 07/15/16 0913)  metroNIDAZOLE (FLAGYL) tablet 500 mg (500 mg Oral Given 07/15/16 0549)  0.9 %  sodium chloride infusion ( Intravenous Rate/Dose Change 07/15/16 0953)  sodium chloride 0.9 % bolus 500 mL (0 mLs Intravenous Stopped 07/13/16 1501)  ceFEPIme (MAXIPIME) 2 g in dextrose 5 % 50 mL IVPB (0 g Intravenous Stopped 07/13/16 1429)  vancomycin (VANCOCIN) IVPB 1000 mg/200 mL premix (0 mg Intravenous Stopped 07/13/16 1501)  sodium chloride 0.9 % bolus 500 mL (0 mLs Intravenous Stopped 07/13/16 1428)  diphenhydrAMINE (BENADRYL) injection 25 mg (25 mg Intravenous Given 07/13/16 1617)  LORazepam (ATIVAN) tablet 1 mg (1 mg Oral Given 07/14/16 0004)  sodium chloride 0.9 % bolus 500 mL (500 mLs Intravenous Given 07/15/16 0630)  magnesium sulfate IVPB 2 g 50 mL (2 g Intravenous Given 07/15/16 0913)     Initial Impression / Assessment and Plan / ED Course  I have reviewed the triage vital signs and the nursing notes.  Pertinent labs & imaging results that were available during my care of the patient were reviewed by me and considered in my medical decision making (see chart for details).    CRITICAL CARE Performed by: Nat Christen Total critical care time: 30 minutes Critical care time was exclusive of separately billable procedures and treating other patients. Critical care was necessary to treat or prevent imminent or life-threatening deterioration. Critical care was time spent personally by me on the following activities: development of treatment plan with patient and/or surrogate as well as nursing, discussions with consultants, evaluation of patient's response to treatment, examination of patient, obtaining history from patient or surrogate,  ordering and performing treatments and interventions, ordering and review of laboratory studies, ordering and review of radiographic studies, pulse oximetry and re-evaluation of patient's condition.  Chest x-ray shows persistent right middle lobe and lingular airspace disease. We initially started BiPAP, but the patient was unable to tolerate this. He was stable with 4 L of nasal oxygen. Will Rx antibiotics for healthcare associated pneumonia. Admit to general medicine.  Final Clinical Impressions(s) / ED Diagnoses   Final diagnoses:  HCAP (healthcare-associated pneumonia)    New Prescriptions Current Discharge Medication List       Nat Christen, MD 07/15/16 1415

## 2016-07-15 NOTE — Progress Notes (Signed)
Daily Progress Note   Patient Name: Warren [name]       Date: 07/15/2016 DOB: September 20, 1955  Age: 61 y.o. MRN#: [id number] Attending Physician: Florencia Reasons, MD Primary Care Physician: Molli Barrows, FNP Admit Date: 07/13/2016  Reason for Consultation/Follow-up: Establishing goals of care  Subjective: Awake, resting in bed, patient believes that the work of his breathing has been improved since yesterday.  See below No family at the bedside  Length of Stay: 2  Current Medications: Scheduled Meds:  . ceFEPime (MAXIPIME) IV  1 g Intravenous Q8H  . enoxaparin (LOVENOX) injection  30 mg Subcutaneous Q24H  . feeding supplement (ENSURE ENLIVE)  237 mL Oral TID BM  . guaiFENesin  600 mg Oral BID  . ipratropium-albuterol  3 mL Nebulization Q6H  . mouth rinse  15 mL Mouth Rinse BID  . methylPREDNISolone (SOLU-MEDROL) injection  40 mg Intravenous Q12H  . metoprolol succinate  12.5 mg Oral QHS  . metroNIDAZOLE  500 mg Oral Q8H  . multivitamin with minerals  1 tablet Oral Daily    Continuous Infusions: . sodium chloride 75 mL/hr at 07/15/16 0953    PRN Meds: acetaminophen **OR** acetaminophen, fluticasone, hydrOXYzine, levalbuterol, ondansetron **OR** ondansetron (ZOFRAN) IV, senna-docusate  Physical Exam         Thin weak elderly gentleman No distress Able to cough Diffuse scattered rhonchi S1 S2, sinus tach on monitor Abdomen soft No edema Thin weak, muscle wasting evident Awake alert Is able to speak, but is not able to speak in full sentences much  Vital Signs: BP 105/69   Pulse 93   Temp (!) 96.5 F (35.8 C) (Axillary) Comment: reported to RN  Resp 18   Ht 5\' 7"  (1.702 m)   Wt 41 kg (90 lb 6.2 oz)   SpO2 97%   BMI 14.16 kg/m  SpO2: SpO2: 97 % O2 Device: O2 Device: Nasal  Cannula O2 Flow Rate: O2 Flow Rate (L/min): 4 L/min  Intake/output summary:  Intake/Output Summary (Last 24 hours) at 07/15/16 1208 Last data filed at 07/15/16 1200  Gross per 24 hour  Intake           788.75 ml  Output             1000 ml  Net          -  211.25 ml   LBM: Last BM Date: 07/15/16 Baseline Weight: Weight: 41 kg (90 lb 6.2 oz) Most recent weight: Weight: 41 kg (90 lb 6.2 oz)       Palliative Assessment/Data:    Flowsheet Rows     Most Recent Value  Intake Tab  Referral Department  Hospitalist  Unit at Time of Referral  Intermediate Care Unit  Palliative Care Primary Diagnosis  Other (Comment) [recurrent PNA neuro degenerative disease ]  Date Notified  07/14/16  Palliative Care Type  New Palliative care  Reason for referral  Clarify Goals of Care  Date of Admission  07/13/16  Date first seen by Palliative Care  07/14/16  # of days Palliative referral response time  0 Day(s)  # of days IP prior to Palliative referral  1  Clinical Assessment  Palliative Performance Scale Score  30%  Pain Max last 24 hours  5  Pain Min Last 24 hours  4  Dyspnea Max Last 24 Hours  6  Dyspnea Min Last 24 hours  4  Psychosocial & Spiritual Assessment  Palliative Care Outcomes  Patient/Family meeting held?  Yes  Who was at the meeting?  patient, wife and son over the phone.   Palliative Care Outcomes  Clarified goals of care      Patient Active Problem List   Diagnosis Date Noted  . Malnutrition of moderate degree 07/14/2016  . HCAP (healthcare-associated pneumonia) 07/13/2016  . Acute on chronic respiratory failure with hypoxia ([place]) 07/13/2016  . Protein-calorie malnutrition, severe 06/23/2016  . Chronic combined systolic and diastolic CHF (congestive heart failure) ([place]) 06/22/2016  . Hypokalemia 06/22/2016  . Acute respiratory failure with hypoxia ([place]) 06/22/2016  . Chest pain 07/25/2014  . 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 Dysphagia,  pharyngoesophageal phase 06/15/2014  . Raynaud disease 06/15/2014  . Dysarthria 02/16/2014  . 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 BPH (benign prostatic hyperplasia) 09/28/2013  . Personal history of colonic polyps 12/02/2012  . Pneumonia 09/29/2012  . Hyponatremia 09/29/2012  . Loss of weight 09/29/2012  . Anemia 09/17/2012  . COPD exacerbation ([place]) 12/21/2010  . Tobacco abuse 12/21/2010    Palliative Care Assessment & Plan   Patient Profile:    Assessment:  61 y.o. male  with past medical history of  COPD and cerebellar ataxia admitted on 07/13/2016 with PNA .  Dyspnea Cough Generalized weakness and deconditioning.  Fatigue  Recommendations/Plan: For now, continuing to monitor patient's disease trajectory from a palliative standpoint Would request repeat PT eval Optimize lung function.     Code Status:    Code Status Orders        Start     Ordered   07/13/16 1739  Do not attempt resuscitation (DNR)  Continuous    Question Answer Comment  In the event of cardiac or respiratory ARREST Do not call a "code blue"   In the event of cardiac or respiratory ARREST Do not perform Intubation, CPR, defibrillation or ACLS   In the event of cardiac or respiratory ARREST Use medication by any route, position, wound care, and other measures to relive pain and suffering. May use oxygen, suction and manual treatment of airway obstruction as needed for comfort.      07/13/16 1738    Code Status History    Date Active Date Inactive Code Status Order ID Comments User Context   07/13/2016  4:23 PM 07/13/2016  5:38 PM Full Code [id number]  Debbe Odea, MD ED   06/22/2016  9:07 PM 06/26/2016  6:41 PM Full Code [id number]  Vianne Bulls, MD ED   07/25/2014  8:34 PM 07/29/2014  6:34 PM Full Code [id number]  Caren Griffins, MD ED       Prognosis:   guarded.   Discharge Planning:  To be determined, based on trajectory.   Care plan was discussed with    Thank you for allowing the Palliative  Medicine Team to assist in the care of this patient.   Time In: 11 Time Out: 11.25 Total Time 25 Prolonged Time Billed  no       Greater than 50%  of this time was spent counseling and coordinating care related to the above assessment and plan.  Loistine Chance, MD [phone]  Please contact Palliative Medicine Team phone at [phone] for questions and concerns.

## 2016-07-15 NOTE — Progress Notes (Signed)
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  cxr on this  admission: Mild persistent right middle lobe and lingular airspace disease concerning for persistent pneumonia. Followup PA and lateral chest X-ray is recommended in 3-4 weeks following trial of antibiotic therapy to ensure resolution and exclude underlying malignancy"  Wbc 24 , lactic acid 2.67, hypoxic, tachycardia, brief hypotension, sbp in the 70-80's initially, blood culture no growth, ua no infection. mrsa screening negative, sputum pending recollection.  He is admitted to stepdown due to desats to the 80's while on 4liter oxygen, he was put on nonrebreather,   he has improved,  Now on 4liters.  continue cefepime /flagyl/steroid /nebs/chest vest,   Pulmonology critical care input appreciated, available prn per note.  Speech eval:  Moderate aspiration risk;Risk for inadequate nutrition/hydration     Diet Recommendation Regular;Thin liquid   Liquid Administration via: Cup Medication Administration: Other (Comment) (prefer with puree, start and follow with liquids) Supervision: Patient able to  self feed Compensations: Minimize environmental distractions;Slow rate;Small sips/bites;Multiple dry swallows after each bite/sip;Other (Comment);Follow solids with liquid (hock and intermittent dry swallow)    Mild hyponatremia: sodium 132-133, monitor  Chronic combined systolic and diastolic CHF (congestive heart failure)  -Per echo in 4/16 EF 45-50% and grade 1 diastolic dysfunction - repeat 2-D echo does not seem to have significant interval changes - He is received a liter of normal saline in the ER- -start hydration from 4/7 due to clinically dry, with tachycardia, borderline bp,   V. tach noted on last admission - Continue metoprolol, keep K>4, mag >2  Spinocerebellar ataxia type VI Progressive, with gait disorder, dysphagia, dysarthria,  For the last 4 weeks he has not been ambulatory, his voice is very weak  Severe malnutrition in context of acute illness/injury, Severe malnutrition in context of chronic illness, Underweight Physical assessment shows severe muscle and severe fat wasting. Per chart review, pt has lost 9 lbs (9.1% body weight) in the past 1 month which is significant for time frame. Nutrition input appreciated  FTT: with progressive neurodegenerative disease and recurrent pneumonia/copd exacerbation, progressive weight loss Palliative care consulted, input appreciated  Over all prognosis is poor.  Code Status: DNR  Family Communication: patient   Disposition Plan: home with home health ? Hospice?   Consultants:  Pulmonology /critical care  Palliative care  Procedures:  NRB  Antibiotics:  vancx1 I nthe ED  Cefepime from admission  Flagyl from 4/6   Objective: BP (!) 84/54   Pulse 87   Temp 98 F (36.7 C) (Oral)   Resp 17   Ht 5\' 7"  (1.702 m)   Wt 41 kg (90 lb 6.2 oz)   SpO2 100%   BMI 14.16 kg/m   Intake/Output Summary (Last 24 hours) at 07/15/16 0805 Last  data filed at 07/15/16 0549  Gross per 24 hour  Intake              750  ml  Output             1000 ml  Net             -250 ml   Filed Weights   07/13/16 1720  Weight: 41 kg (90 lb 6.2 oz)    Exam:   General:  Frail, chronically ill, very thin, NAD  Cardiovascular:less sinus tachycardia   Respiratory: decreased, some rhonchi, no wheezing  Abdomen: Soft/ND/NT, positive BS  Musculoskeletal: No Edema  Neuro: aaox3  Data Reviewed: Basic Metabolic Panel:  Recent Labs Lab 07/13/16 1127 07/13/16 1314 07/14/16 0340 07/15/16 0402  NA 132* 133* 132* 131*  K 4.4 4.5 4.3 4.2  CL 97* 98* 101 97*  CO2 26 26 23 28   GLUCOSE 108* 98 121* 141*  BUN 20 21* 18 16  CREATININE 0.79 0.80 0.77 0.59*  CALCIUM 8.2* 8.5* 8.1* 8.4*  MG  --   --   --  1.7   Liver Function Tests:  Recent Labs Lab 07/13/16 1127 07/13/16 1314 07/15/16 0402  AST 15 26 23   ALT 13 16* 19  ALKPHOS 75 78 65  BILITOT 0.7 0.8 0.6  PROT 5.5* 6.4* 5.6*  ALBUMIN 3.0* 3.2* 2.7*   No results for input(s): LIPASE, AMYLASE in the last 168 hours. No results for input(s): AMMONIA in the last 168 hours. CBC:  Recent Labs Lab 07/13/16 1127 07/13/16 1314 07/14/16 0340 07/15/16 0402  WBC 20.8* 24.4* 19.3* 12.3*  NEUTROABS 19,344* 22.5*  --   --   HGB 10.8* 11.5* 9.3* 8.6*  HCT 32.1* 33.8* 27.5* 25.0*  MCV 95.3 94.2 93.9 92.6  PLT 339 324 286 237   Cardiac Enzymes:   No results for input(s): CKTOTAL, CKMB, CKMBINDEX, TROPONINI in the last 168 hours. BNP (last 3 results)  Recent Labs  06/22/16 1706 07/13/16 1127  BNP 254.1* 145.0*    ProBNP (last 3 results) No results for input(s): PROBNP in the last 8760 hours.  CBG: No results for input(s): GLUCAP in the last 168 hours.  Recent Results (from the past 240 hour(s))  Culture, blood (Routine x 2)     Status: None (Preliminary result)   Collection Time: 07/13/16  1:15 PM  Result Value Ref Range Status   Specimen Description BLOOD RIGHT ANTECUBITAL  Final   Special Requests   Final    BOTTLES DRAWN AEROBIC AND  ANAEROBIC Blood Culture adequate volume   Culture   Final    NO GROWTH < 24 HOURS Performed at [facility], [address], [place], [place] [address]    Report Status PENDING  Incomplete  Culture, blood (Routine x 2)     Status: None (Preliminary result)   Collection Time: 07/13/16  1:16 PM  Result Value Ref Range Status   Specimen Description BLOOD LEFT ANTECUBITAL  Final   Special Requests   Final    BOTTLES DRAWN AEROBIC AND ANAEROBIC Blood Culture adequate volume   Culture   Final    NO GROWTH < 24 HOURS Performed at [facility], [place] [address] [place], [place] [address]    Report Status PENDING  Incomplete  MRSA PCR Screening     Status: None   Collection Time: 07/13/16  5:24 PM  Result Value Ref Range Status   MRSA by PCR NEGATIVE NEGATIVE Final  Comment:        The GeneXpert MRSA Assay (FDA approved for NASAL specimens only), is one component of a comprehensive MRSA colonization surveillance program. It is not intended to diagnose MRSA infection nor to guide or monitor treatment for MRSA infections.   Culture, expectorated sputum-assessment     Status: None   Collection Time: 07/14/16  4:49 PM  Result Value Ref Range Status   Specimen Description SPUTUM  Final   Special Requests NONE  Final   Sputum evaluation   Final    Sputum specimen not acceptable for testing.  Please recollect.   NOTIFIED SAPNA,RN [id number] @ [place]    Report Status 07/15/2016 FINAL  Final     Studies: No results found.  Scheduled Meds: . ceFEPime (MAXIPIME) IV  1 g Intravenous Q8H  . enoxaparin (LOVENOX) injection  30 mg Subcutaneous Q24H  . feeding supplement (ENSURE ENLIVE)  237 mL Oral TID BM  . guaiFENesin  600 mg Oral BID  . ipratropium-albuterol  3 mL Nebulization Q6H  . mouth rinse  15 mL Mouth Rinse BID  . methylPREDNISolone (SOLU-MEDROL) injection  40 mg Intravenous Q12H  . metoprolol succinate  12.5 mg Oral QHS  . metroNIDAZOLE  500 mg Oral Q8H    . multivitamin with minerals  1 tablet Oral Daily  . sodium chloride  500 mL Intravenous Once    Continuous Infusions:   Time spent: 75mins  Jerad Dunlap MD, PhD  [facility] Pager [phone]. If 7PM-7AM, please contact night-coverage at [email or web address], password [facility] 07/15/2016, 8:05 AM  LOS: 2 days

## 2016-07-16 ENCOUNTER — Inpatient Hospital Stay (HOSPITAL_COMMUNITY): Payer: BLUE CROSS/BLUE SHIELD

## 2016-07-16 ENCOUNTER — Encounter (HOSPITAL_COMMUNITY): Payer: Self-pay | Admitting: Radiology

## 2016-07-16 LAB — BASIC METABOLIC PANEL
Anion gap: 6 (ref 5–15)
BUN: 16 mg/dL (ref 6–20)
CALCIUM: 8.4 mg/dL — AB (ref 8.9–10.3)
CO2: 28 mmol/L (ref 22–32)
CREATININE: 0.69 mg/dL (ref 0.61–1.24)
Chloride: 100 mmol/L — ABNORMAL LOW (ref 101–111)
Glucose, Bld: 146 mg/dL — ABNORMAL HIGH (ref 65–99)
Potassium: 4.1 mmol/L (ref 3.5–5.1)
SODIUM: 134 mmol/L — AB (ref 135–145)

## 2016-07-16 LAB — EXPECTORATED SPUTUM ASSESSMENT W REFEX TO RESP CULTURE

## 2016-07-16 LAB — CBC
HCT: 27.6 % — ABNORMAL LOW (ref 39.0–52.0)
HEMOGLOBIN: 9.1 g/dL — AB (ref 13.0–17.0)
MCH: 30.1 pg (ref 26.0–34.0)
MCHC: 33 g/dL (ref 30.0–36.0)
MCV: 91.4 fL (ref 78.0–100.0)
Platelets: 251 10*3/uL (ref 150–400)
RBC: 3.02 MIL/uL — ABNORMAL LOW (ref 4.22–5.81)
RDW: 14.5 % (ref 11.5–15.5)
WBC: 9.1 10*3/uL (ref 4.0–10.5)

## 2016-07-16 LAB — EXPECTORATED SPUTUM ASSESSMENT W GRAM STAIN, RFLX TO RESP C

## 2016-07-16 MED ORDER — CLONAZEPAM 0.5 MG PO TABS
0.2500 mg | ORAL_TABLET | Freq: Three times a day (TID) | ORAL | Status: DC | PRN
  Administered 2016-07-16 – 2016-07-17 (×4): 0.25 mg via ORAL
  Filled 2016-07-16 (×5): qty 1

## 2016-07-16 MED ORDER — HYDROCORTISONE NA SUCCINATE PF 100 MG IJ SOLR
50.0000 mg | Freq: Four times a day (QID) | INTRAMUSCULAR | Status: DC
  Administered 2016-07-16 – 2016-07-18 (×9): 50 mg via INTRAVENOUS
  Filled 2016-07-16 (×9): qty 2

## 2016-07-16 MED ORDER — SODIUM CHLORIDE 0.9 % IV BOLUS (SEPSIS)
500.0000 mL | Freq: Once | INTRAVENOUS | Status: AC
  Administered 2016-07-16: 500 mL via INTRAVENOUS

## 2016-07-16 MED ORDER — SODIUM CHLORIDE 0.9 % IV SOLN
INTRAVENOUS | Status: AC
  Administered 2016-07-16: 20:00:00 via INTRAVENOUS

## 2016-07-16 NOTE — Evaluation (Signed)
Occupational Therapy Evaluation Patient Details Name: Warren [name] MRN: [id number] DOB: 1955-11-20 Today's Date: 07/16/2016    History of Present Illness Pt is a 61 year old male with PMHx significant for COPD, L hip fx, cerebellar ataxia, and frequent pneumonias. Pt also with a history of dysphagia secondary to the cerebellar ataxia and was recently admitted with RLL PNA thought secondary to aspiration and admitted 07/13/16 for recurrent pneumonia   Clinical Impression   Pt admitted with pneumonia. Pt currently with functional limitations due to the deficits listed below (see OT Problem List). Pt will benefit from skilled OT to increase their safety and independence with ADL and functional mobility for ADL to facilitate discharge to venue listed below.     Follow Up Recommendations  Home health OT;Supervision/Assistance - 24 hour    Equipment Recommendations  None recommended by OT    Recommendations for Other Services       Precautions / Restrictions Precautions Precautions: Fall Precaution Comments: monitor HR and O2 Restrictions Weight Bearing Restrictions: No      Mobility Bed Mobility Overal bed mobility: Needs Assistance Bed Mobility: Supine to Sit     Supine to sit: Min guard;HOB elevated        Transfers Overall transfer level: Needs assistance Equipment used: Rolling walker (2 wheeled) Transfers: Sit to/from [facility] Sit to Stand: Min assist Stand pivot transfers: Min assist       General transfer comment: cues for hand placement and safety    Balance Overall balance assessment: History of Falls;Needs assistance         Standing balance support: Bilateral upper extremity supported Standing balance-Leahy Scale: Poor Standing balance comment: reliant on UE support for static balance                           ADL either performed or assessed with clinical judgement   ADL Overall ADL's : Needs  assistance/impaired Eating/Feeding: Set up;Sitting   Grooming: Minimal assistance;Sitting   Upper Body Bathing: Moderate assistance;Sitting   Lower Body Bathing: Moderate assistance;Sit to/from stand;Cueing for safety;Cueing for sequencing   Upper Body Dressing : Minimal assistance;Sitting   Lower Body Dressing: Moderate assistance;Sit to/from stand;Cueing for safety;Cueing for compensatory techniques   Toilet Transfer: RW;Minimal Print production planner Details (indicate cue type and reason): sit to stand with urinal Toileting- Clothing Manipulation and Hygiene: Minimal assistance;Sit to/from stand;Cueing for safety;Cueing for compensatory techniques;Moderate assistance         General ADL Comments: pt needed constant VC to take deep breathes during activity . Pt did agree to get to chair after using urinal     Vision Patient Visual Report: No change from baseline              Pertinent Vitals/Pain Pain Assessment: No/denies pain Pain Location: pt dyspneic     Hand Dominance     Extremity/Trunk Assessment Upper Extremity Assessment Upper Extremity Assessment: Generalized weakness       Cervical / Trunk Assessment Cervical / Trunk Assessment: Kyphotic   Communication Communication Communication: No difficulties (soft spoken and difficult to understand at times)   Cognition Arousal/Alertness: Awake/alert Behavior During Therapy: WFL for tasks assessed/performed Overall Cognitive Status: Within Functional Limits for tasks assessed  Home Living Family/patient expects to be discharged to:: Private residence Living Arrangements: Spouse/significant other   Type of Home: House Home Access: Stairs to enter [employer] of Steps: 3   Home Layout: One level     Bathroom Shower/Tub: Tub/shower unit         [place] - single point;Walker - 2 wheels   Additional  Comments: plans to go to sons house upon recent discharge --no stairs, one level, stated he received PT at home and was ambulating 30 feet with RW      Prior Functioning/Environment Level of Independence: Independent with assistive device(s);Needs assistance  Gait / Transfers Assistance Needed: amb with RW short distance ADL's / Homemaking Assistance Needed: wife assists with ADLs            OT Problem List: Decreased strength;Decreased activity tolerance;Cardiopulmonary status limiting activity      OT Treatment/Interventions: Self-care/ADL training;Patient/family education;Energy conservation    OT Goals(Current goals can be found in the care plan section) Acute Rehab OT Goals Patient Stated Goal: get well OT Goal Formulation: With patient Time For Goal Achievement: 07/30/16 Potential to Achieve Goals: Good  OT Frequency: Min 2X/week   Barriers to D/C:               End of Session Equipment Utilized During Treatment: Surveyor, mining Communication: Mobility status  Activity Tolerance: Patient limited by fatigue Patient left: in chair;with call bell/phone within reach  OT Visit Diagnosis: Unsteadiness on feet (R26.81);Muscle weakness (generalized) (M62.81)                Time: 6004-5997 OT Time Calculation (min): 14 min Charges:  OT General Charges $OT Visit: 1 Procedure OT Evaluation $OT Eval Moderate Complexity: 1 Procedure G-Codes:     Kari Baars, [place]  Payton Mccallum D 07/16/2016, 11:30 AM

## 2016-07-16 NOTE — Progress Notes (Signed)
Pharmacy Antibiotic Note  Warren [name] is a 61 y.o. male admitted on 07/13/2016 with HCAP.  Pharmacy has been consulted for  cefepime dosing on 4/5. Then Flagyl added for possible aspiration PNA on 4/6  Plan: Day 4 total Abx's Continue Cefepime 1 gm IV q8 per current renal function  Height: 5\' 7"  (170.2 cm) Weight: 90 lb 6.2 oz (41 kg) IBW/kg (Calculated) : 66.1  Temp (24hrs), Avg:98.3 F (36.8 C), Min:97.9 F (36.6 C), Max:98.6 F (37 C)   Recent Labs Lab 07/13/16 1127 07/13/16 1314 07/13/16 1327 07/13/16 1804 07/14/16 0340 07/15/16 0402 07/16/16 0349  WBC 20.8* 24.4*  --   --  19.3* 12.3* 9.1  CREATININE 0.79 0.80  --   --  0.77 0.59* 0.69  LATICACIDVEN  --   --  2.67* 1.5  --   --   --     Estimated Creatinine Clearance: 55.5 mL/min (by C-G formula based on SCr of 0.69 mg/dL).    Allergies  Allergen Reactions  . Contrast Media [Iodinated Diagnostic Agents] Hives and Itching  . Iodine Hives and Itching    Antimicrobials this admission: Vanc 4/5>> 4/5 Cefepime 4/5>> 4/6 Flagyl >>  Dose adjustments this admission:  Microbiology results: 4/5 BCx2 >> ngtd 4/6 sputum: ngf 4/5 MRSA screen neg   Thank you for allowing pharmacy to be a part of this patient's care.  Adrian Saran, PharmD, BCPS Pager [phone] 07/16/2016 9:29 AM

## 2016-07-16 NOTE — Progress Notes (Signed)
PROGRESS NOTE  Warren [name] OVF:[id number] DOB: 1956/03/05 DOA: 07/13/2016 PCP: Molli Barrows, FNP  HPI/Recap of past 24 hours:  Continued productive cough, less sinus tachycardia, bp borderline low,  Denies pain, he does has intermittent anxiety   Assessment/Plan: Principal Problem:   HCAP (healthcare-associated pneumonia) Active Problems:   COPD exacerbation ([place])   Hyponatremia   Loss of weight   Spinocerebellar ataxia type 6 (HCC)   Dysphagia, pharyngoesophageal phase   Chronic combined systolic and diastolic CHF (congestive heart failure) (HCC)   Protein-calorie malnutrition, severe   Acute on chronic respiratory failure with hypoxia (HCC)   Malnutrition of moderate degree  Acute on chronic respiratory failure (baseline 4liters)/copd exacerbation(baseline prednisone 20mg  daily)/HCAP+/-aspiration pneumonia/sepsis presented on admission He was discharged on 3/19 and finished abx treatment but he did not get better.  On presentation he has Wbc 24 , lactic acid 2.67, hypoxic, tachycardia, brief hypotension, sbp in the 70-80's initially,  He is admitted to stepdown due to desats to the 80's while on 4liter oxygen, he was put on nonrebreather,   CT chest : 1. Severe emphysematous changes and pulmonary scarring. 2. Right lower lobe airspace consolidation, likely focal pneumonia. 3. Peribronchial thickening and patchy airspace opacity in the left lower lobe likely bronchopneumonia. Aspiration is possible. 4. No pulmonary edema, pleural effusions or worrisome pulmonary lesions.  He could not get CTA due to contrast media allergy, echo PA peak pressure 61mmHg. Less likely had PE.   blood culture no growth, ua no infection. mrsa screening negative, sputum culture in process.  he has improved,  Now on 2liters, wbc normolaized on 4/8,   continue cefepime /flagyl/steroid /nebs/chest vest,   Pulmonology critical care input appreciated, available prn per note.  Speech eval:  Moderate  aspiration risk;Risk for inadequate nutrition/hydration     Diet Recommendation Regular;Thin liquid   Liquid Administration via: Cup Medication Administration: Other (Comment) (prefer with puree, start and follow with liquids) Supervision: Patient able to self feed Compensations: Minimize environmental distractions;Slow rate;Small sips/bites;Multiple dry swallows after each bite/sip;Other (Comment);Follow solids with liquid (hock and intermittent dry swallow)    Mild hyponatremia: sodium 132-133, improving  Chronic combined systolic and diastolic CHF (congestive heart failure)  -Per echo in 4/16 EF 45-50% and grade 1 diastolic dysfunction - repeat 2-D echo does not seem to have significant interval changes - He is received a liter of normal saline in the ER- -start hydration from 4/7 due to clinically dry, with tachycardia, borderline bp, -continue hydration, small 500cc bolus given on 4/8 due to continued borderline bp   V. tach noted on last admission, sinus tachycardia this hospitalization - Continue metoprolol, keep K>4, mag >2  Spinocerebellar ataxia type VI Progressive, with gait disorder, dysphagia, dysarthria,  For the last 4 weeks he has not been ambulatory, his voice is very weak  Severe malnutrition in context of acute illness/injury, Severe malnutrition in context of chronic illness, Underweight Physical assessment shows severe muscle and severe fat wasting. Per chart review, pt has lost 9 lbs (9.1% body weight) in the past 1 month which is significant for time frame. Nutrition input appreciated  FTT: with progressive neurodegenerative disease and recurrent pneumonia/copd exacerbation, progressive weight loss Palliative care consulted, input appreciated  Over all prognosis is poor.  Code Status: DNR  Family Communication: patient   Disposition Plan: home with home health ? Hospice?   Consultants:  Pulmonology /critical care  Palliative  care  Procedures:  NRB  Antibiotics:  vancx1 I nthe ED  Cefepime  from admission  Flagyl from 4/6   Objective: BP (!) 87/66 Comment: pt sleeping on left side  Pulse (!) 54   Temp 98.3 F (36.8 C) (Oral)   Resp 16   Ht 5\' 7"  (1.702 m)   Wt 41 kg (90 lb 6.2 oz)   SpO2 96%   BMI 14.16 kg/m   Intake/Output Summary (Last 24 hours) at 07/16/16 0804 Last data filed at 07/16/16 0600  Gross per 24 hour  Intake          1508.75 ml  Output             2600 ml  Net         -1091.25 ml   Filed Weights   07/13/16 1720  Weight: 41 kg (90 lb 6.2 oz)    Exam:   General:  Frail, chronically ill, very thin, NAD  Cardiovascular:less sinus tachycardia   Respiratory: decreased, some rhonchi, no wheezing  Abdomen: Soft/ND/NT, positive BS  Musculoskeletal: No Edema  Neuro: aaox3  Data Reviewed: Basic Metabolic Panel:  Recent Labs Lab 07/13/16 1127 07/13/16 1314 07/14/16 0340 07/15/16 0402 07/16/16 0349  NA 132* 133* 132* 131* 134*  K 4.4 4.5 4.3 4.2 4.1  CL 97* 98* 101 97* 100*  CO2 26 26 23 28 28   GLUCOSE 108* 98 121* 141* 146*  BUN 20 21* 18 16 16   CREATININE 0.79 0.80 0.77 0.59* 0.69  CALCIUM 8.2* 8.5* 8.1* 8.4* 8.4*  MG  --   --   --  1.7  --    Liver Function Tests:  Recent Labs Lab 07/13/16 1127 07/13/16 1314 07/15/16 0402  AST 15 26 23   ALT 13 16* 19  ALKPHOS 75 78 65  BILITOT 0.7 0.8 0.6  PROT 5.5* 6.4* 5.6*  ALBUMIN 3.0* 3.2* 2.7*   No results for input(s): LIPASE, AMYLASE in the last 168 hours. No results for input(s): AMMONIA in the last 168 hours. CBC:  Recent Labs Lab 07/13/16 1127 07/13/16 1314 07/14/16 0340 07/15/16 0402 07/16/16 0349  WBC 20.8* 24.4* 19.3* 12.3* 9.1  NEUTROABS 19,344* 22.5*  --   --   --   HGB 10.8* 11.5* 9.3* 8.6* 9.1*  HCT 32.1* 33.8* 27.5* 25.0* 27.6*  MCV 95.3 94.2 93.9 92.6 91.4  PLT 339 324 286 237 251   Cardiac Enzymes:   No results for input(s): CKTOTAL, CKMB, CKMBINDEX, TROPONINI in the last  168 hours. BNP (last 3 results)  Recent Labs  06/22/16 1706 07/13/16 1127  BNP 254.1* 145.0*    ProBNP (last 3 results) No results for input(s): PROBNP in the last 8760 hours.  CBG: No results for input(s): GLUCAP in the last 168 hours.  Recent Results (from the past 240 hour(s))  Culture, blood (Routine x 2)     Status: None (Preliminary result)   Collection Time: 07/13/16  1:15 PM  Result Value Ref Range Status   Specimen Description BLOOD RIGHT ANTECUBITAL  Final   Special Requests   Final    BOTTLES DRAWN AEROBIC AND ANAEROBIC Blood Culture adequate volume   Culture   Final    NO GROWTH 2 DAYS Performed at [facility], [address], [place], [place] [address]    Report Status PENDING  Incomplete  Culture, blood (Routine x 2)     Status: None (Preliminary result)   Collection Time: 07/13/16  1:16 PM  Result Value Ref Range Status   Specimen Description BLOOD LEFT ANTECUBITAL  Final   Special  Requests   Final    BOTTLES DRAWN AEROBIC AND ANAEROBIC Blood Culture adequate volume   Culture   Final    NO GROWTH 2 DAYS Performed at [facility], [place] [address] [place], [place] [address]    Report Status PENDING  Incomplete  MRSA PCR Screening     Status: None   Collection Time: 07/13/16  5:24 PM  Result Value Ref Range Status   MRSA by PCR NEGATIVE NEGATIVE Final    Comment:        The GeneXpert MRSA Assay (FDA approved for NASAL specimens only), is one component of a comprehensive MRSA colonization surveillance program. It is not intended to diagnose MRSA infection nor to guide or monitor treatment for MRSA infections.   Culture, expectorated sputum-assessment     Status: None   Collection Time: 07/14/16  4:49 PM  Result Value Ref Range Status   Specimen Description SPUTUM  Final   Special Requests NONE  Final   Sputum evaluation   Final    Sputum specimen not acceptable for testing.  Please recollect.   NOTIFIED SAPNA,RN [id number] @ [place]    Report Status 07/15/2016 FINAL  Final  Culture, expectorated sputum-assessment     Status: None   Collection Time: 07/16/16  6:01 AM  Result Value Ref Range Status   Specimen Description SPUTUM  Final   Special Requests NONE  Final   Sputum evaluation THIS SPECIMEN IS ACCEPTABLE FOR SPUTUM CULTURE  Final   Report Status 07/16/2016 FINAL  Final     Studies: No results found.  Scheduled Meds: . ceFEPime (MAXIPIME) IV  1 g Intravenous Q8H  . enoxaparin (LOVENOX) injection  30 mg Subcutaneous Q24H  . feeding supplement (ENSURE ENLIVE)  237 mL Oral TID BM  . guaiFENesin  600 mg Oral BID  . hydrocortisone sod succinate (SOLU-CORTEF) inj  50 mg Intravenous Q6H  . ipratropium-albuterol  3 mL Nebulization Q6H  . mouth rinse  15 mL Mouth Rinse BID  . methylPREDNISolone (SOLU-MEDROL) injection  40 mg Intravenous Q12H  . metoprolol succinate  12.5 mg Oral QHS  . metroNIDAZOLE  500 mg Oral Q8H  . multivitamin with minerals  1 tablet Oral Daily  . sodium chloride  500 mL Intravenous Once    Continuous Infusions: . sodium chloride 75 mL/hr at 07/16/16 0600     Time spent: 64mins  Dmarco Baldus MD, PhD  [facility] Pager [phone]. If 7PM-7AM, please contact night-coverage at [email or web address], password [facility] 07/16/2016, 8:04 AM  LOS: 3 days

## 2016-07-17 LAB — BASIC METABOLIC PANEL
ANION GAP: 5 (ref 5–15)
BUN: 14 mg/dL (ref 6–20)
CHLORIDE: 101 mmol/L (ref 101–111)
CO2: 28 mmol/L (ref 22–32)
Calcium: 8 mg/dL — ABNORMAL LOW (ref 8.9–10.3)
Creatinine, Ser: 0.53 mg/dL — ABNORMAL LOW (ref 0.61–1.24)
GFR calc Af Amer: >60 mL/min (ref >60)
GLUCOSE: 136 mg/dL — AB (ref 65–99)
POTASSIUM: 3.2 mmol/L — AB (ref 3.5–5.1)
Sodium: 134 mmol/L — ABNORMAL LOW (ref 135–145)

## 2016-07-17 LAB — MAGNESIUM: Magnesium: 1.6 mg/dL — ABNORMAL LOW (ref 1.7–2.4)

## 2016-07-17 MED ORDER — SODIUM CHLORIDE 0.9 % IV SOLN: INTRAVENOUS | Status: AC

## 2016-07-17 MED ORDER — POTASSIUM CHLORIDE 20 MEQ/15ML (10%) PO SOLN
40.0000 meq | ORAL | Status: DC
  Administered 2016-07-17: 40 meq via ORAL
  Filled 2016-07-17: qty 30

## 2016-07-17 MED ORDER — MAGNESIUM SULFATE 2 GM/50ML IV SOLN
2.0000 g | Freq: Once | INTRAVENOUS | Status: AC
  Administered 2016-07-17: 2 g via INTRAVENOUS
  Filled 2016-07-17: qty 50

## 2016-07-17 MED ORDER — POTASSIUM CHLORIDE CRYS ER 20 MEQ PO TBCR
40.0000 meq | EXTENDED_RELEASE_TABLET | ORAL | Status: AC
  Administered 2016-07-17: 40 meq via ORAL
  Filled 2016-07-17: qty 2

## 2016-07-17 MED ORDER — IPRATROPIUM-ALBUTEROL 0.5-2.5 (3) MG/3ML IN SOLN
3.0000 mL | Freq: Three times a day (TID) | RESPIRATORY_TRACT | Status: DC
  Administered 2016-07-17 – 2016-07-18 (×4): 3 mL via RESPIRATORY_TRACT
  Filled 2016-07-17 (×4): qty 3

## 2016-07-17 NOTE — Progress Notes (Signed)
Daily Progress Note   Patient Name: Warren [name]       Date: 07/17/2016 DOB: May 30, 1955  Age: 61 y.o. MRN#: [id number] Attending Physician: Warren Reasons, MD Primary Care Physician: Warren Barrows, FNP Admit Date: 07/13/2016  Reason for Consultation/Follow-up: Establishing goals of care  Subjective: Awake, resting in bed, patient eating lunch, is weak, is able to feed self, is in no distress.  See below No family at the bedside  Length of Stay: 4  Current Medications: Scheduled Meds:  . ceFEPime (MAXIPIME) IV  1 g Intravenous Q8H  . enoxaparin (LOVENOX) injection  30 mg Subcutaneous Q24H  . feeding supplement (ENSURE ENLIVE)  237 mL Oral TID BM  . guaiFENesin  600 mg Oral BID  . hydrocortisone sod succinate (SOLU-CORTEF) inj  50 mg Intravenous Q6H  . ipratropium-albuterol  3 mL Nebulization TID  . mouth rinse  15 mL Mouth Rinse BID  . methylPREDNISolone (SOLU-MEDROL) injection  40 mg Intravenous Q12H  . metoprolol succinate  12.5 mg Oral QHS  . metroNIDAZOLE  500 mg Oral Q8H  . multivitamin with minerals  1 tablet Oral Daily  . potassium chloride  40 mEq Oral Q4H    Continuous Infusions: . sodium chloride      PRN Meds: acetaminophen **OR** acetaminophen, clonazePAM, fluticasone, hydrOXYzine, levalbuterol, ondansetron **OR** ondansetron (ZOFRAN) IV, senna-docusate  Physical Exam         Thin weak elderly gentleman No distress Able to cough Diffuse scattered rhonchi S1 S2, sinus tach on monitor Abdomen soft No edema Thin weak, muscle wasting evident Awake alert   Vital Signs: BP 106/69   Pulse 78   Temp 97.3 F (36.3 C) (Oral)   Resp (!) 22   Ht 5\' 7"  (1.702 m)   Wt 41 kg (90 lb 6.2 oz)   SpO2 94%   BMI 14.16 kg/m  SpO2: SpO2: 94 % O2 Device: O2 Device: Nasal  Cannula O2 Flow Rate: O2 Flow Rate (L/min): 2 L/min  Intake/output summary:   Intake/Output Summary (Last 24 hours) at 07/17/16 1323 Last data filed at 07/17/16 1300  Gross per 24 hour  Intake          1356.25 ml  Output             1800 ml  Net          -  443.75 ml   LBM: Last BM Date: 07/16/16 Baseline Weight: Weight: 41 kg (90 lb 6.2 oz) Most recent weight: Weight: 41 kg (90 lb 6.2 oz)       Palliative Assessment/Data:    Flowsheet Rows     Most Recent Value  Intake Tab  Referral Department  Hospitalist  Unit at Time of Referral  Intermediate Care Unit  Palliative Care Primary Diagnosis  Other (Comment) [recurrent PNA neuro degenerative disease ]  Date Notified  07/14/16  Palliative Care Type  New Palliative care  Reason for referral  Clarify Goals of Care  Date of Admission  07/13/16  Date first seen by Palliative Care  07/14/16  # of days Palliative referral response time  0 Day(s)  # of days IP prior to Palliative referral  1  Clinical Assessment  Palliative Performance Scale Score  30%  Pain Max last 24 hours  5  Pain Min Last 24 hours  4  Dyspnea Max Last 24 Hours  6  Dyspnea Min Last 24 hours  4  Psychosocial & Spiritual Assessment  Palliative Care Outcomes  Patient/Family meeting held?  Yes  Who was at the meeting?  patient, wife and son over the phone.   Palliative Care Outcomes  Clarified goals of care      Patient Active Problem List   Diagnosis Date Noted  . Malnutrition of moderate degree 07/14/2016  . HCAP (healthcare-associated pneumonia) 07/13/2016  . Acute on chronic respiratory failure with hypoxia ([place]) 07/13/2016  . Protein-calorie malnutrition, severe 06/23/2016  . Chronic combined systolic and diastolic CHF (congestive heart failure) ([place]) 06/22/2016  . Hypokalemia 06/22/2016  . Acute respiratory failure with hypoxia ([place]) 06/22/2016  . Chest pain 07/25/2014  . 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 Dysphagia,  pharyngoesophageal phase 06/15/2014  . Raynaud disease 06/15/2014  . Dysarthria 02/16/2014  . 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 BPH (benign prostatic hyperplasia) 09/28/2013  . Personal history of colonic polyps 12/02/2012  . Pneumonia 09/29/2012  . Hyponatremia 09/29/2012  . Loss of weight 09/29/2012  . Anemia 09/17/2012  . COPD exacerbation ([place]) 12/21/2010  . Tobacco abuse 12/21/2010    Palliative Care Assessment & Plan   Patient Profile:    Assessment:  61 y.o. male  with past medical history of  COPD and cerebellar ataxia admitted on 07/13/2016 with PNA .  Dyspnea Cough Generalized weakness and deconditioning.  Fatigue  Recommendations/Plan: Call placed and discussed with wife Mrs Warren [name] who was not at the bedside this afternoon: Mrs Warren [name] had questions for me, pertaining to hospice care, difference between hospice and palliative care, home health care and different levels of discharge available for Warren [name].   We discussed about hospice philosophy of care, comfort at end of life, focus on symptoms and focus on keeping the patient home as much as possible. Wife is agreeable to the patient coming home with hospice support. I will request case manager consult to start facilitating this. He might need durable medical equipment such as a hospital bed.       Code Status:    Code Status Orders        Start     Ordered   07/13/16 1739  Do not attempt resuscitation (DNR)  Continuous    Question Answer Comment  In the event of cardiac or respiratory ARREST Do not call a "code blue"   In the event of cardiac or respiratory ARREST Do not perform Intubation, CPR, defibrillation or ACLS  In the event of cardiac or respiratory ARREST Use medication by any route, position, wound care, and other measures to relive pain and suffering. May use oxygen, suction and manual treatment of airway obstruction as needed for comfort.      07/13/16 1738    Code Status History    Date Active Date  Inactive Code Status Order ID Comments User Context   07/13/2016  4:23 PM 07/13/2016  5:38 PM Full Code [id number]  Debbe Odea, MD ED   06/22/2016  9:07 PM 06/26/2016  6:41 PM Full Code [id number]  Vianne Bulls, MD ED   07/25/2014  8:34 PM 07/29/2014  6:34 PM Full Code [id number]  Costin Karlyne Greenspan, MD ED       Prognosis:   guarded.   Discharge Planning:  Patient and wife are agreeable to home with hospice care.   Care plan was discussed with  Patient, wife over the phone.   Thank you for allowing the Palliative Medicine Team to assist in the care of this patient.   Time In: 12.55 Time Out: 1320 Total Time 25 Prolonged Time Billed  no       Greater than 50%  of this time was spent counseling and coordinating care related to the above assessment and plan.  Loistine Chance, MD [phone]  Please contact Palliative Medicine Team phone at [phone] for questions and concerns.

## 2016-07-17 NOTE — Progress Notes (Signed)
  Speech Language Pathology Treatment: Dysphagia  Patient Details Name: Warren [name] MRN: [id number] DOB: 27-Feb-1956 Today's Date: 07/17/2016 Time: 8185-6314 SLP Time Calculation (min) (ACUTE ONLY): 21 min  Assessment / Plan / Recommendation Clinical Impression  Pt seen for dysphagia treatment. Reviewed treatment plan based on prior evaluation re:t pt wishes to continue regular texture and thin liquids with acceptance of risks (questionable of pt's full understanding). Indications present of high aspiration risk due primarily to significant dyspnea that increased throughout session. Pt noted to inhale immediately after swallow versus typical exhalation pattern. Discussed softer textures may prevent fatigue from mastication and increased dyspnea however pt wishes to remain on regular texture. He recalled 1 swallow strategy without cueing and required cues for 2/3. Reiterated importance of eating/drinking while upright (pt takes rest breaks independently). Continue regular texture, thin liquids, cups preferred, pills whole in applesauce. Education has been completed, Palliative care following pt. ST to sign off at this time.   HPI HPI: 61 yo male adm to [facility] with congestion and recent treatment with ABX- failed treatment and still has Right lower lobe pna.   PMH + for COPD, candida esophagitis, spinocerebellar ataxia, Raynaud's, tobacco use.  Pt previously has undergone MBSs x 2 - 2016  and 06/2016 with findings of Pt denies problems swallowing, admits to issues with cough x 2 weeks with weakness and poor mobility, weight loss.  Current weight is 90 pounds and during last visit 06/23/16 he was 97 pounds.  Pt denies dysphagia but admits to coughing only when eating.  Swallow evaluation ordered.       SLP Plan  Discharge SLP treatment due to (comment)       Recommendations  Diet recommendations: Regular;Thin liquid Liquids provided via: Cup;No straw Medication Administration: Whole meds with  puree Supervision: Patient able to self feed;Intermittent supervision to cue for compensatory strategies Compensations: Minimize environmental distractions;Slow rate;Small sips/bites;Multiple dry swallows after each bite/sip;Other (Comment);Follow solids with liquid Postural Changes and/or Swallow Maneuvers: Seated upright 90 degrees;Upright 30-60 min after meal                Oral Care Recommendations: Oral care BID Follow up Recommendations: None SLP Visit Diagnosis: Dysphagia, pharyngoesophageal phase (R13.14) Plan: Discharge SLP treatment due to (comment)       GO                Houston Siren 07/17/2016, 1:01 PM   Orbie Pyo Colvin Caroli.Ed [employer] [phone]

## 2016-07-17 NOTE — Progress Notes (Addendum)
PROGRESS NOTE  Warren [name] RKY:[id number] DOB: 1955/10/22 DOA: 07/13/2016 PCP: Molli Barrows, FNP  HPI/Recap of past 24 hours:  less cough, sinus tachycardia has resolved, bp borderline low,  Denies pain, he does has intermittent anxiety  Wife at bedside  Assessment/Plan: Principal Problem:   HCAP (healthcare-associated pneumonia) Active Problems:   COPD exacerbation ([place])   Hyponatremia   Loss of weight   Spinocerebellar ataxia type 6 (HCC)   Dysphagia, pharyngoesophageal phase   Chronic combined systolic and diastolic CHF (congestive heart failure) (HCC)   Protein-calorie malnutrition, severe   Acute on chronic respiratory failure with hypoxia (HCC)   Malnutrition of moderate degree  Acute on chronic respiratory failure (baseline 4liters)/copd exacerbation(baseline prednisone 20mg  daily)/HCAP+/-aspiration pneumonia/sepsis presented on admission He was discharged on 3/19 and finished abx treatment but he did not get better.  On presentation he has Wbc 24 , lactic acid 2.67, hypoxic, tachycardia, brief hypotension, sbp in the 70-80's initially,  He is admitted to stepdown due to desats to the 80's while on 4liter oxygen, he required nonrebreather initial on presentation.  CT chest : 1. Severe emphysematous changes and pulmonary scarring. 2. Right lower lobe airspace consolidation, likely focal pneumonia. 3. Peribronchial thickening and patchy airspace opacity in the left lower lobe likely bronchopneumonia. Aspiration is possible. 4. No pulmonary edema, pleural effusions or worrisome pulmonary lesions.  He could not get CTA due to contrast media allergy, echo PA peak pressure 44mmHg. Less likely has PE.   blood culture no growth, ua no infection. mrsa screening negative, sputum culture in process.  he has improved,  Now on 2-3liters, wbc normolaized on 4/8,   continue cefepime /flagyl/steroid /nebs/chest vest,   Pulmonology critical care input appreciated, available prn per  note.  Speech eval:  Moderate aspiration risk;Risk for inadequate nutrition/hydration     Diet Recommendation Regular;Thin liquid   Liquid Administration via: Cup Medication Administration: Other (Comment) (prefer with puree, start and follow with liquids) Supervision: Patient able to self feed Compensations: Minimize environmental distractions;Slow rate;Small sips/bites;Multiple dry swallows after each bite/sip;Other (Comment);Follow solids with liquid (hock and intermittent dry swallow)    Mild hyponatremia: sodium 132-133, improving  Chronic combined systolic and diastolic CHF (congestive heart failure)  -Per echo in 4/16 EF 45-50% and grade 1 diastolic dysfunction - repeat 2-D echo does not seem to have significant interval changes - He is received a liter of normal saline in the ER- --clinically dry, continue hydration,    V. tach noted on last admission,   sinus tachycardia on presentation this time - Continue metoprolol, keep K>4, mag >2  Hypokalemia/hypomagnesemia: replace k/mag  Spinocerebellar ataxia type VI Progressive, with gait disorder, dysphagia, dysarthria,  For the last 4 weeks he has not been ambulatory, his voice is very weak  Severe malnutrition in context of acute illness/injury, Severe malnutrition in context of chronic illness, Underweight Physical assessment shows severe muscle and severe fat wasting. Per chart review, pt has lost 9 lbs (9.1% body weight) in the past 1 month which is significant for time frame. Nutrition input appreciated  FTT: with progressive neurodegenerative disease and recurrent pneumonia/copd exacerbation, progressive weight loss Palliative care consulted, input appreciated  Over all prognosis is poor.  Code Status: DNR  Family Communication: patient   Disposition Plan: home with home health ? Hospice?, sputum culture pending,  likely able to d/c on 4/10 once sputum culture finalized, and family decide on disposition  plan.   Consultants:  Pulmonology /critical care  Palliative care  Procedures:  NRB  Antibiotics:  vancx1 I nthe ED  Cefepime from admission  Flagyl from 4/6   Objective: BP 103/67   Pulse 87   Temp 97.9 F (36.6 C) (Oral)   Resp 19   Ht 5\' 7"  (1.702 m)   Wt 41 kg (90 lb 6.2 oz)   SpO2 98%   BMI 14.16 kg/m   Intake/Output Summary (Last 24 hours) at 07/17/16 1159 Last data filed at 07/17/16 0545  Gross per 24 hour  Intake             1665 ml  Output             1725 ml  Net              -60 ml   Filed Weights   07/13/16 1720  Weight: 41 kg (90 lb 6.2 oz)    Exam:   General:  Frail, chronically ill, very thin, NAD  Cardiovascular: sinus tachycardia has resolved, no NSR  Respiratory: decreased, some rhonchi, no wheezing  Abdomen: Soft/ND/NT, positive BS  Musculoskeletal: No Edema  Neuro: aaox3  Data Reviewed: Basic Metabolic Panel:  Recent Labs Lab 07/13/16 1314 07/14/16 0340 07/15/16 0402 07/16/16 0349 07/17/16 0401  NA 133* 132* 131* 134* 134*  K 4.5 4.3 4.2 4.1 3.2*  CL 98* 101 97* 100* 101  CO2 26 23 28 28 28   GLUCOSE 98 121* 141* 146* 136*  BUN 21* 18 16 16 14   CREATININE 0.80 0.77 0.59* 0.69 0.53*  CALCIUM 8.5* 8.1* 8.4* 8.4* 8.0*  MG  --   --  1.7  --  1.6*   Liver Function Tests:  Recent Labs Lab 07/13/16 1127 07/13/16 1314 07/15/16 0402  AST 15 26 23   ALT 13 16* 19  ALKPHOS 75 78 65  BILITOT 0.7 0.8 0.6  PROT 5.5* 6.4* 5.6*  ALBUMIN 3.0* 3.2* 2.7*   No results for input(s): LIPASE, AMYLASE in the last 168 hours. No results for input(s): AMMONIA in the last 168 hours. CBC:  Recent Labs Lab 07/13/16 1127 07/13/16 1314 07/14/16 0340 07/15/16 0402 07/16/16 0349  WBC 20.8* 24.4* 19.3* 12.3* 9.1  NEUTROABS 19,344* 22.5*  --   --   --   HGB 10.8* 11.5* 9.3* 8.6* 9.1*  HCT 32.1* 33.8* 27.5* 25.0* 27.6*  MCV 95.3 94.2 93.9 92.6 91.4  PLT 339 324 286 237 251   Cardiac Enzymes:   No results for input(s):  CKTOTAL, CKMB, CKMBINDEX, TROPONINI in the last 168 hours. BNP (last 3 results)  Recent Labs  06/22/16 1706 07/13/16 1127  BNP 254.1* 145.0*    ProBNP (last 3 results) No results for input(s): PROBNP in the last 8760 hours.  CBG: No results for input(s): GLUCAP in the last 168 hours.  Recent Results (from the past 240 hour(s))  Culture, blood (Routine x 2)     Status: None (Preliminary result)   Collection Time: 07/13/16  1:15 PM  Result Value Ref Range Status   Specimen Description BLOOD RIGHT ANTECUBITAL  Final   Special Requests   Final    BOTTLES DRAWN AEROBIC AND ANAEROBIC Blood Culture adequate volume   Culture   Final    NO GROWTH 3 DAYS Performed at [facility], [address], [place], [place] [address]    Report Status PENDING  Incomplete  Culture, blood (Routine x 2)     Status: None (Preliminary result)   Collection Time: 07/13/16  1:16 PM  Result Value Ref Range Status  Specimen Description BLOOD LEFT ANTECUBITAL  Final   Special Requests   Final    BOTTLES DRAWN AEROBIC AND ANAEROBIC Blood Culture adequate volume   Culture   Final    NO GROWTH 3 DAYS Performed at [facility], [address], [place], [place] [address]    Report Status PENDING  Incomplete  MRSA PCR Screening     Status: None   Collection Time: 07/13/16  5:24 PM  Result Value Ref Range Status   MRSA by PCR NEGATIVE NEGATIVE Final    Comment:        The GeneXpert MRSA Assay (FDA approved for NASAL specimens only), is one component of a comprehensive MRSA colonization surveillance program. It is not intended to diagnose MRSA infection nor to guide or monitor treatment for MRSA infections.   Culture, expectorated sputum-assessment     Status: None   Collection Time: 07/14/16  4:49 PM  Result Value Ref Range Status   Specimen Description SPUTUM  Final   Special Requests NONE  Final   Sputum evaluation   Final    Sputum specimen not acceptable for testing.  Please  recollect.   NOTIFIED SAPNA,RN [id number] @ [place]    Report Status 07/15/2016 FINAL  Final  Culture, expectorated sputum-assessment     Status: None   Collection Time: 07/16/16  6:01 AM  Result Value Ref Range Status   Specimen Description SPUTUM  Final   Special Requests NONE  Final   Sputum evaluation THIS SPECIMEN IS ACCEPTABLE FOR SPUTUM CULTURE  Final   Report Status 07/16/2016 FINAL  Final  Culture, respiratory (NON-Expectorated)     Status: None (Preliminary result)   Collection Time: 07/16/16  6:01 AM  Result Value Ref Range Status   Specimen Description SPUTUM  Final   Special Requests NONE Reflexed from [id number]  Final   Gram Stain   Final    RARE WBC PRESENT, PREDOMINANTLY PMN RARE GRAM POSITIVE COCCI IN PAIRS RARE GRAM POSITIVE RODS    Culture   Final    CULTURE REINCUBATED FOR BETTER GROWTH Performed at [facility], [place] [address], [place], [place] [address]    Report Status PENDING  Incomplete     Studies: No results found.  Scheduled Meds: . ceFEPime (MAXIPIME) IV  1 g Intravenous Q8H  . enoxaparin (LOVENOX) injection  30 mg Subcutaneous Q24H  . feeding supplement (ENSURE ENLIVE)  237 mL Oral TID BM  . guaiFENesin  600 mg Oral BID  . hydrocortisone sod succinate (SOLU-CORTEF) inj  50 mg Intravenous Q6H  . ipratropium-albuterol  3 mL Nebulization TID  . mouth rinse  15 mL Mouth Rinse BID  . methylPREDNISolone (SOLU-MEDROL) injection  40 mg Intravenous Q12H  . metoprolol succinate  12.5 mg Oral QHS  . metroNIDAZOLE  500 mg Oral Q8H  . multivitamin with minerals  1 tablet Oral Daily  . potassium chloride  40 mEq Oral Q4H    Continuous Infusions: . sodium chloride 75 mL/hr at 07/16/16 2014  . sodium chloride       Time spent: 21mins  Hosey Burmester MD, PhD  [facility] Pager [phone]. If 7PM-7AM, please contact night-coverage at [email or web address], password [facility] 07/17/2016, 11:59 AM  LOS: 4 days

## 2016-07-17 NOTE — Care Management Note (Signed)
Case Management Note  Patient Details  Name: Warren [name] MRN: [id number] Date of Birth: [birth year]  Subjective/Objective:60 y/o m admitted w/HCAP. From home w/spouse. Referral for home hospice choice-spoke to patient-deferred to spouse-left message w/Mrs. Ketterman tel#[phone]-await call back to offer choice, & confirm dme needed-per patient needs hospital bed, & suction supplies-[facility]. Has own transp home.                    Action/Plan:d/c home w/hospice.   Expected Discharge Date:   (unknown)               Expected Discharge Plan:  Home w Hospice Care  In-House Referral:     Discharge planning Services  CM Consult  Post Acute Care Choice:    Choice offered to:     DME Arranged:    DME Agency:     HH Arranged:    HH Agency:     Status of Service:  In process, will continue to follow  If discussed at Long Length of Stay Meetings, dates discussed:    Additional Comments:  Dessa Phi, RN 07/17/2016, 2:36 PM

## 2016-07-17 NOTE — Care Management Note (Signed)
Case Management Note  Patient Details  Name: Warren [name] MRN: [id number] Date of Birth: [birth year]  Subjective/Objective: Spoke to spouse on [place] aware of d/c & services @ home-pcp-[facility], dme-suction-[facility]-needed @ d/c. Patient already has home 02-[facility]. Spouse will discuss w/patient about hospital bed at a later time or tomorrow. Family will transp home on own.                   Action/Plan:d/c home w/hospice   Expected Discharge Date:   (unknown)               Expected Discharge Plan:  Home w Hospice Care  In-House Referral:     Discharge planning Services  CM Consult  Post Acute Care Choice:  Durable Medical Equipment [facility] [phone]) Choice offered to:  Spouse  DME Arranged:  Suction DME Agency:     HH Arranged:  RN [place] Agency:     Status of Service:  Completed, signed off  If discussed at [place] of Stay Meetings, dates discussed:    Additional Comments:  Dessa Phi, RN 07/17/2016, 4:03 PM

## 2016-07-18 LAB — BASIC METABOLIC PANEL
Anion gap: 6 (ref 5–15)
BUN: 11 mg/dL (ref 6–20)
CO2: 28 mmol/L (ref 22–32)
Calcium: 7.9 mg/dL — ABNORMAL LOW (ref 8.9–10.3)
Chloride: 100 mmol/L — ABNORMAL LOW (ref 101–111)
Creatinine, Ser: 0.54 mg/dL — ABNORMAL LOW (ref 0.61–1.24)
GFR calc Af Amer: >60 mL/min (ref >60)
Glucose, Bld: 131 mg/dL — ABNORMAL HIGH (ref 65–99)
Potassium: 3.6 mmol/L (ref 3.5–5.1)
SODIUM: 134 mmol/L — AB (ref 135–145)

## 2016-07-18 LAB — CULTURE, BLOOD (ROUTINE X 2)
CULTURE: NO GROWTH
Culture: NO GROWTH
SPECIAL REQUESTS: ADEQUATE
SPECIAL REQUESTS: ADEQUATE

## 2016-07-18 LAB — CULTURE, RESPIRATORY

## 2016-07-18 LAB — CULTURE, RESPIRATORY W GRAM STAIN

## 2016-07-18 LAB — MAGNESIUM: Magnesium: 1.8 mg/dL (ref 1.7–2.4)

## 2016-07-18 MED ORDER — MAGNESIUM SULFATE 2 GM/50ML IV SOLN
2.0000 g | Freq: Once | INTRAVENOUS | Status: AC
  Administered 2016-07-18: 2 g via INTRAVENOUS
  Filled 2016-07-18: qty 50

## 2016-07-18 MED ORDER — POTASSIUM CHLORIDE CRYS ER 20 MEQ PO TBCR
40.0000 meq | EXTENDED_RELEASE_TABLET | Freq: Once | ORAL | Status: AC
  Administered 2016-07-18: 40 meq via ORAL
  Filled 2016-07-18: qty 2

## 2016-07-18 MED ORDER — GUAIFENESIN ER 600 MG PO TB12: 600.0000 mg | ORAL_TABLET | Freq: Two times a day (BID) | ORAL | 0 refills | Status: DC

## 2016-07-18 MED ORDER — AMOXICILLIN-POT CLAVULANATE 875-125 MG PO TABS: 1.0000 | ORAL_TABLET | Freq: Two times a day (BID) | ORAL | 0 refills | Status: AC

## 2016-07-18 MED ORDER — ENSURE ENLIVE PO LIQD: 237.0000 mL | Freq: Three times a day (TID) | ORAL | 12 refills | Status: AC

## 2016-07-18 MED ORDER — POTASSIUM CHLORIDE 20 MEQ/15ML (10%) PO SOLN
40.0000 meq | Freq: Once | ORAL | Status: DC
  Filled 2016-07-18: qty 30

## 2016-07-18 MED ORDER — CLONAZEPAM 0.5 MG PO TABS: 0.2500 mg | ORAL_TABLET | Freq: Three times a day (TID) | ORAL | 0 refills | Status: DC | PRN

## 2016-07-18 NOTE — Discharge Summary (Signed)
Discharge Summary  Warren [name] KXF:[id number] DOB: [birth year]  PCP: Molli Barrows, FNP  Admit date: 07/13/2016 Discharge date: 07/18/2016  Time spent: >79mins  Recommendations for Outpatient Follow-up:  1. Patient is discharged home with home hospice 2. F/u with PMD as needed  Discharge Diagnoses:  Active Hospital Problems   Diagnosis Date Noted  . HCAP (healthcare-associated pneumonia) 07/13/2016  . Malnutrition of moderate degree 07/14/2016  . Acute on chronic respiratory failure with hypoxia ([place]) 07/13/2016  . Protein-calorie malnutrition, severe 06/23/2016  . Chronic combined systolic and diastolic CHF (congestive heart failure) ([place]) 06/22/2016  . 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 Dysphagia, pharyngoesophageal phase 06/15/2014  . Loss of weight 09/29/2012  . Hyponatremia 09/29/2012  . COPD exacerbation ([place]) 12/21/2010    Resolved Hospital Problems   Diagnosis Date Noted Date Resolved  No resolved problems to display.    Discharge Condition: stable  Diet recommendation: regular diet  Diet recommendations: Regular;Thin liquid Liquids provided via: Cup;No straw Medication Administration: Whole meds with puree Supervision: Patient able to self feed;Intermittent supervision to cue for compensatory strategies Compensations: Minimize environmental distractions;Slow rate;Small sips/bites;Multiple dry swallows after each bite/sip;Other (Comment);Follow solids with liquid Postural Changes and/or Swallow Maneuvers: Seated upright 90 degrees;Upright 30-60 min after meal  Filed Weights   07/13/16 1720  Weight: 41 kg (90 lb 6.2 oz)    History of present illness:  PCP: Molli Barrows, FNP  Patient coming from: home  Chief Complaint: shortness of breath and cough  HPI: Warren [name] is a 61 y.o. male with medical history significant for COPD on 2 L o2 at home follows with Dr Lamonte Sakai,  Spinocerebellar ataxia, Rayauds disease, recently admitted for pneumonia  which was right lower lobe and suspected to be aspiration. He received a total of 7 days of treatment, finishing up with Augmentin at home. He saw his PCP today According to family symptoms of cough and congestion improved however slowly began to recur with resultant shortness of breath as well. He is coughing up yellowish brown mucus. He presents to Hospital in respiratory distress and initially required BiPAP. Now on 4 L of oxygen breathing heavily. Admits to being anxious and has been using hydroxyzine at home for anxiety. Has not taken his medications today. He has been taking 20 mg of prednisone daily without a taper as prescribed by Dr. Lamonte Sakai. No complaint of chest pain or fevers.  ED Course:  Heart rate in the 120s, respiratory rate in the 20s, pulse ox 95% on 4 L of oxygen. WBC count 24.4, lactic acid 2.67 Chest x-ray shows right middle lobe and lingular lung disease Has been given Maxipime, vancomycin, 1 L of normal saline  Hospital Course:  Principal Problem:   HCAP (healthcare-associated pneumonia) Active Problems:   COPD exacerbation ([place])   Hyponatremia   Loss of weight   Spinocerebellar ataxia type 6 (HCC)   Dysphagia, pharyngoesophageal phase   Chronic combined systolic and diastolic CHF (congestive heart failure) (HCC)   Protein-calorie malnutrition, severe   Acute on chronic respiratory failure with hypoxia (HCC)   Malnutrition of moderate degree   Acute on chronic respiratory failure (baseline 4liters)/copd exacerbation(baseline prednisone daily)/HCAP+/-aspiration pneumonia/sepsis presented on admission He was discharged on 3/19 and finished abx treatment but he did not get better.  On presentation he has Wbc 24 , lactic acid 2.67, hypoxic, tachycardia, brief hypotension, sbp in the 70-80's initially,  He is admitted to stepdown due to desats to the 80's while on 4liter oxygen, he required  nonrebreather initial on presentation.  CT chest : 1. Severe emphysematous  changes and pulmonary scarring. 2. Right lower lobe airspace consolidation, likely focal pneumonia. 3. Peribronchial thickening and patchy airspace opacity in the left lower lobe likely bronchopneumonia. Aspiration is possible. 4. No pulmonary edema, pleural effusions or worrisome pulmonary lesions.  He could not get CTA due to contrast media allergy, echo PA peak pressure 64mmHg. Less likely has PE.   blood culture no growth, ua no infection. mrsa screening negative, sputum culture MULTIPLE ORGANISMS PRESENT, NONE PREDOMINANT (A)  he has improved,  Now on 2-3liters, wbc normolaized on 4/8,   he received cefepime /flagyl/steroid /nebs/chest vest,  He is discharged on augmentin and continue home dose prednisone daily, he is discharged home with home hospice.  Pulmonology critical care input appreciated, available prn per note.  Speech eval:  Moderate aspiration risk;Risk for inadequate nutrition/hydration    Diet Recommendation Regular;Thin liquid   Liquid Administration via: Cup Medication Administration: Other (Comment) (prefer with puree, start and follow with liquids) Supervision: Patient able to self feed Compensations: Minimize environmental distractions;Slow rate;Small sips/bites;Multiple dry swallows after each bite/sip;Other (Comment);Follow solids with liquid (hock and intermittent dry swallow)   Mild hyponatremia: sodium 132-133, improving, na 134 at discharge.  Chronic combined systolic and diastolic CHF (congestive heart failure)  -Per echo in 4/16 EF 45-50% and grade 1diastolic dysfunction - repeat 2-D echo does not seem to have significant interval changes - He is received a liter of normal saline in the ER- --clinically dry, he received hydration   V. tach noted on last admission,  sinus tachycardia on presentation this time - Continue metoprolol, keep K>4, mag >2  Hypokalemia/hypomagnesemia: replace k/mag  Spinocerebellar ataxia type  VI Progressive, with gait disorder, dysphagia, dysarthria, For the last 4 weeks he has not been ambulatory, his voice is very weak  Severe malnutrition in context of acute illness/injury, Severe malnutrition in context of chronic illness, Underweight Physical assessment shows severe muscle and severe fat wasting. Per chart review, pt has lost 9 lbs (9.1% body weight) in the past 1 month which is significant for time frame. Nutrition input appreciated  FTT: with progressive neurodegenerative disease and recurrent pneumonia/copd exacerbation, progressive weight loss Palliative care consulted, input appreciated  Over all prognosis is poor.  Code Status: DNR  Family Communication: patient   Disposition Plan: home with home Hospice on 4/10     Consultants:  Pulmonology /critical care  Palliative care  Procedures:  NRB  Antibiotics:  vancx1 I nthe ED  Cefepime from admission  Flagyl from 4/6   Discharge Exam: BP (!) 139/95   Pulse 83   Temp 98.7 F (37.1 C) (Oral)   Resp 17   Ht 5\' 7"  (1.702 m)   Wt 41 kg (90 lb 6.2 oz)   SpO2 98%   BMI 14.16 kg/m     General:  Frail, chronically ill, very thin, NAD  Cardiovascular: sinus tachycardia has resolved, no NSR  Respiratory: decreased, some rhonchi, no wheezing  Abdomen: Soft/ND/NT, positive BS  Musculoskeletal: No Edema  Neuro: aaox3   Discharge Instructions You were cared for by a hospitalist during your hospital stay. If you have any questions about your discharge medications or the care you received while you were in the hospital after you are discharged, you can call the unit and asked to speak with the hospitalist on call if the hospitalist that took care of you is not available. Once you are discharged, your primary care physician will handle  any further medical issues. Please note that NO REFILLS for any discharge medications will be authorized once you are discharged, as it is imperative that  you return to your primary care physician (or establish a relationship with a primary care physician if you do not have one) for your aftercare needs so that they can reassess your need for medications and monitor your lab values.  Discharge Instructions    Diet general    Complete by:  As directed    For home use only DME Suction    Complete by:  As directed    Suction:  Oral   Increase activity slowly    Complete by:  As directed      Allergies as of 07/18/2016      Reactions   Contrast Media [iodinated Diagnostic Agents] Hives, Itching   Iodine Hives, Itching      Medication List    STOP taking these medications   LORazepam 0.5 MG tablet Commonly known as:  ATIVAN   megestrol 625 MG/5ML suspension Commonly known as:  MEGACE ES     TAKE these medications   albuterol 108 (90 Base) MCG/ACT inhaler Commonly known as:  PROVENTIL HFA;VENTOLIN HFA Inhale 2 puffs into the lungs as needed for wheezing or shortness of breath. What changed:  when to take this   albuterol (2.5 MG/3ML) 0.083% nebulizer solution Commonly known as:  PROVENTIL Take 3 mLs (2.5 mg total) by nebulization every 6 (six) hours as needed for wheezing or shortness of breath. What changed:  Another medication with the same name was changed. Make sure you understand how and when to take each.   amoxicillin-clavulanate 875-125 MG tablet Commonly known as:  AUGMENTIN Take 1 tablet by mouth 2 (two) times daily.   budesonide-formoterol 160-4.5 MCG/ACT inhaler Commonly known as:  SYMBICORT Inhale 2 puffs into the lungs 2 (two) times daily.   clonazePAM 0.5 MG tablet Commonly known as:  KLONOPIN Take 0.5 tablets (0.25 mg total) by mouth 3 (three) times daily as needed (anxiety).   dextromethorphan-guaiFENesin 30-600 MG 12hr tablet Commonly known as:  MUCINEX DM Take 1 tablet by mouth daily.   feeding supplement (ENSURE ENLIVE) Liqd Take 237 mLs by mouth 3 (three) times daily between meals.   fluticasone  50 MCG/ACT nasal spray Commonly known as:  FLONASE Place 2 sprays into both nostrils daily as needed for rhinitis.   guaiFENesin 600 MG 12 hr tablet Commonly known as:  MUCINEX Take 1 tablet (600 mg total) by mouth 2 (two) times daily.   hydrOXYzine 50 MG capsule Commonly known as:  VISTARIL Take 1-2 capsules (50-100 mg total) by mouth 2 (two) times daily as needed for anxiety.   ibuprofen 200 MG tablet Commonly known as:  ADVIL,MOTRIN Take 400 mg by mouth every 6 (six) hours as needed for mild pain or moderate pain.   ipratropium 0.02 % nebulizer solution Commonly known as:  ATROVENT Take 0.5 mg by nebulization every 6 (six) hours as needed for wheezing or shortness of breath.   metoprolol succinate 25 MG 24 hr tablet Commonly known as:  TOPROL XL Take 0.5 tablets (12.5 mg total) by mouth daily. What changed:  when to take this   predniSONE 20 MG tablet Commonly known as:  DELTASONE Take 2 tablets (40 mg total) by mouth daily before breakfast.            Durable Medical Equipment        Start     Ordered  07/17/16 0000  For home use only DME Suction    Question:  Suction  Answer:  Oral   07/17/16 1346     Allergies  Allergen Reactions  . Contrast Media [Iodinated Diagnostic Agents] Hives and Itching  . Iodine Hives and Itching   Follow-up Information    [facility] Follow up.   Specialty:  Hospice Services Why:  home nursing visit-[email or web address]/[email or web address] Contact information: [place] [address] [phone]        Molli Barrows, FNP Follow up.   Specialty:  Family Medicine Why:  as needed Contact information: [place] [address] [phone]            The results of significant diagnostics from this hospitalization (including imaging, microbiology, ancillary and laboratory) are listed below for reference.    Significant Diagnostic Studies: Dg Chest 2 View  Result Date: 07/13/2016 CLINICAL  DATA:  Recent pneumonia.  Shortness of breath. EXAM: CHEST  2 VIEW COMPARISON:  06/22/2016 FINDINGS: The lungs are hyperinflated likely secondary to COPD. Bilateral chronic interstitial lung disease. Hazy lingular airspace disease concerning for pneumonia. Near complete resolution of right lower lobe airspace disease with a small area of persistent right middle lobe airspace disease. Small bilateral pleural effusions. The heart and mediastinal contours are unremarkable. The osseous structures are unremarkable. IMPRESSION: Mild persistent right middle lobe and lingular airspace disease concerning for persistent pneumonia. Followup PA and lateral chest X-ray is recommended in 3-4 weeks following trial of antibiotic therapy to ensure resolution and exclude underlying malignancy. Electronically Signed   By: Kathreen Devoid   On: 07/13/2016 12:11   Dg Chest 2 View  Result Date: 06/22/2016 CLINICAL DATA:  Recent pneumonia.  Shortness of breath. EXAM: CHEST  2 VIEW COMPARISON:  06/13/2016 FINDINGS: The heart size is normal. There is a small right pleural effusion. Lungs are hyperinflated and there are chronic coarsened interstitial markings compatible with COPD/emphysema. Right lung base airspace consolidation involving the anterior right lung base is identified compatible with pneumonia. IMPRESSION: 1. Persistent right lung base pneumonia. 2. Right pleural effusion 3. COPD/emphysema Electronically Signed   By: Kerby Moors M.D.   On: 06/22/2016 18:23   Ct Chest Wo Contrast  Result Date: 07/16/2016 CLINICAL DATA:  Acute on chronic respiratory failure. EXAM: CT CHEST WITHOUT CONTRAST TECHNIQUE: Multidetector CT imaging of the chest was performed following the standard protocol without IV contrast. COMPARISON:  Chest CT 07/25/2014 and multiple recent chest x-ray FINDINGS: Chest wall: Cachexia. No chest wall mass, supraclavicular or axillary lymphadenopathy. The thyroid gland is grossly normal. Cardiovascular: The heart  is normal in size. No pericardial effusion. Mild tortuosity and calcification of the thoracic aorta. Three-vessel coronary artery calcifications. Mediastinum/Nodes: No mediastinal or hilar mass or lymphadenopathy. The small amount of debris is noted in the lower trachea near the carina. The esophagus is grossly. Small hiatal hernia. Lungs/Pleura: Severe bullous emphysema and parenchymal scarring. Stable large cavity/bulla at the right apex with significant surrounding pleural scarring. Airspace consolidation in the right lower lobe along the major fissure with air bronchograms and bronchiectasis most likely representing pneumonia. Chronic left basilar scarring changes and patchy airspace opacity and peribronchial thickening which could reflect bronchopneumonia. No pleural effusions or pulmonary edema. No worrisome pulmonary lesions. Upper Abdomen: No significant upper abdominal findings. Musculoskeletal: No significant osseous abnormalities. There is a remote appearing mid thoracic compression fracture. IMPRESSION: 1. Severe emphysematous changes and pulmonary scarring. 2. Right lower lobe airspace consolidation, likely focal pneumonia.  3. Peribronchial thickening and patchy airspace opacity in the left lower lobe likely bronchopneumonia. Aspiration is possible. 4. No pulmonary edema, pleural effusions or worrisome pulmonary lesions. Electronically Signed   By: Marijo Sanes M.D.   On: 07/16/2016 10:03   Dg Swallowing Func-speech Pathology  Result Date: 06/23/2016 Objective Swallowing Evaluation: Type of Study: MBS-Modified Barium Swallow Study Patient Details Name: Warren [name] MRN: [id number] Date of Birth: 06/18/1955 Today's Date: 06/23/2016 Time: SLP Start Time (ACUTE ONLY): 1415-SLP Stop Time (ACUTE ONLY): 1445 SLP Time Calculation (min) (ACUTE ONLY): 30 min Past Medical History: Past Medical History: Diagnosis Date . Abnormal MRI of head  . Allergic rhinitis  . Candidal esophagitis (HCC)   Dr. Benson Norway 10/2012 .  COPD (chronic obstructive pulmonary disease) ([place])  . Dysarthria 02/16/2014 . Dysphagia, pharyngoesophageal phase 06/15/2014 . 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 Osteoporosis 04/10/2009  L hip fracture; T score -4.2 in 2012.  Fosamax for one year. . Pneumonia  . Raynaud disease 06/15/2014  Bilateral hands . 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 Testicular atrophy   Right Past Surgical History: Past Surgical History: Procedure Laterality Date . CATARACT EXTRACTION    BOTH EYES . COLONOSCOPY W/ POLYPECTOMY  12/07/2006  three polyps sigmoid.  [name]. Repeat 3 years. . COLONOSCOPY W/ POLYPECTOMY  09/24/2012  five sessile polyps removed.  Internal and external hemorrhoids. . egd  09/24/2012  hiatal hernia; candidal esophagitis, hematin in stomach; no active source of bleeding. Marland Kitchen EYE SURGERY  04/10/2014  B cataract surgery . INGUINAL HERNIA REPAIR    bilateral . NASAL FRACTURE SURGERY   . VASECTOMY   HPI: 61 yo male adm to [facility] with congestion and recent treatment with ABX.  Pt found to have Right lower lobe pna.   [place] + for COPD, candida esophagitis, spinocerebellar ataxia, Raynaud's, tobacco use.  Pt previously has undergone MBS as OP in 2016 May that showed moderate oropharyngeal-cervical esopahgeal dysphagia.  Pt denies problems swallowing, admits to issues with cough x 2 weeks with weakness and poor mobility, weight loss.  Current weight is 97 pounds and pt reports normal weight is 104.  Pt tx with ABX in coummunity but did not improve respiratory issues.  Subjective: pt wake in chair, family present *(wife and son) Assessment / Plan / Recommendation CHL IP CLINICAL IMPRESSIONS 06/23/2016 Clinical Impression Pt presents with findings largely comparative to prior MBS in May 2016.  Decreased oral control again allows premature spillage of barium into pharynx and mild oral residuals.   Pt sensed oral residuals of solids but not liquids and mild intermittent liquid residue spills into pharynx without consistent reflexive swallow.   Barium tablet swallowed with thin lodged at vallecular space and reflexive dry swallow transited it into pharynx. Decreased tongue base retraction/epiglottic deflection allows vallecular residuals.  Dry swallows and liquids faciliate clearance.  NO aspiration or deep laryngeal penetration observed.  Pt today was unable to perform lingual propulsion to clear vallecular residuals *as he conducted on MBS two years prior.  Encourged him to strengthen his swallow, expectoration ability for maximal airway protection.  Recommend continue diet as tolerated with compensation strategies.   SLP will follow up while pt in house to maximize swallow rehab  and airway protection.  Educated pt and family using live video and teach back to findings.  SLP Visit Diagnosis Dysphagia, oropharyngeal phase (R13.12) Attention and concentration deficit following -- Frontal lobe and executive function deficit following -- Impact on safety and function Moderate aspiration risk   CHL IP TREATMENT  RECOMMENDATION 06/23/2016 Treatment Recommendations Therapy as outlined in treatment plan below   Prognosis 06/23/2016 Prognosis for Safe Diet Advancement Good Barriers to Reach Goals -- Barriers/Prognosis Comment -- CHL IP DIET RECOMMENDATION 06/23/2016 SLP Diet Recommendations Regular solids;Thin liquid Liquid Administration via Cup;No straw Medication Administration (No Data) Compensations Slow rate;Small sips/bites;Multiple dry swallows after each bite/sip;Follow solids with liquid;Other (Comment) Postural Changes Seated upright at 90 degrees;Remain semi-upright after after feeds/meals (Comment)   CHL IP OTHER RECOMMENDATIONS 06/23/2016 Recommended Consults -- Oral Care Recommendations Oral care BID Other Recommendations --   CHL IP FOLLOW UP RECOMMENDATIONS 06/23/2016 Follow up Recommendations Home health SLP   CHL IP FREQUENCY AND DURATION 06/23/2016 Speech Therapy Frequency (ACUTE ONLY) min 1 x/week Treatment Duration 1 week      CHL IP ORAL PHASE  06/23/2016 Oral Phase Impaired Oral - Pudding Teaspoon -- Oral - Pudding Cup -- Oral - Honey Teaspoon -- Oral - Honey Cup -- Oral - Nectar Teaspoon -- Oral - Nectar Cup Weak lingual manipulation;Reduced posterior propulsion;Piecemeal swallowing;Decreased bolus cohesion Oral - Nectar Straw -- Oral - Thin Teaspoon -- Oral - Thin Cup Weak lingual manipulation;Reduced posterior propulsion;Piecemeal swallowing;Decreased bolus cohesion Oral - Thin Straw Weak lingual manipulation;Piecemeal swallowing;Decreased bolus cohesion Oral - Puree Weak lingual manipulation;Lingual/palatal residue;Piecemeal swallowing Oral - Mech Soft -- Oral - Regular Weak lingual manipulation;Delayed oral transit;Premature spillage;Lingual/palatal residue;Piecemeal swallowing Oral - Multi-Consistency Weak lingual manipulation;Reduced posterior propulsion;Premature spillage;Piecemeal swallowing Oral - Pill Weak lingual manipulation;Premature spillage;Piecemeal swallowing;Reduced posterior propulsion Oral Phase - Comment oral residuals spill into pharynx poorly controlled, pt does not consistently reflexively swallow with pharyngeal residuals  CHL IP PHARYNGEAL PHASE 06/23/2016 Pharyngeal Phase Impaired Pharyngeal- Pudding Teaspoon -- Pharyngeal -- Pharyngeal- Pudding Cup -- Pharyngeal -- Pharyngeal- Honey Teaspoon -- Pharyngeal -- Pharyngeal- Honey Cup -- Pharyngeal -- Pharyngeal- Nectar Teaspoon -- Pharyngeal -- Pharyngeal- Nectar Cup Pharyngeal residue - valleculae;Reduced epiglottic inversion;Reduced tongue base retraction Pharyngeal -- Pharyngeal- Nectar Straw -- Pharyngeal -- Pharyngeal- Thin Teaspoon -- Pharyngeal -- Pharyngeal- Thin Cup Penetration/Aspiration during swallow;Reduced epiglottic inversion;Reduced tongue base retraction Pharyngeal Material enters airway, remains ABOVE vocal cords then ejected out Pharyngeal- Thin Straw Reduced epiglottic inversion;Reduced tongue base retraction Pharyngeal -- Pharyngeal- Puree Reduced epiglottic  inversion;Reduced tongue base retraction Pharyngeal -- Pharyngeal- Mechanical Soft -- Pharyngeal -- Pharyngeal- Regular Reduced epiglottic inversion;Reduced tongue base retraction Pharyngeal -- Pharyngeal- Multi-consistency Reduced epiglottic inversion;Reduced tongue base retraction Pharyngeal -- Pharyngeal- Pill Reduced epiglottic inversion;Reduced tongue base retraction Pharyngeal -- Pharyngeal Comment --  CHL IP CERVICAL ESOPHAGEAL PHASE 06/23/2016 Cervical Esophageal Phase (No Data) Pudding Teaspoon -- Pudding Cup -- Honey Teaspoon -- Honey Cup -- Nectar Teaspoon -- Nectar Cup -- Nectar Straw -- Thin Teaspoon -- Thin Cup -- Thin Straw -- Puree -- Mechanical Soft -- Regular -- Multi-consistency -- Pill -- Cervical Esophageal Comment -- Luanna Salk, MS [facility] SLP [phone]               Microbiology: Recent Results (from the past 240 hour(s))  Culture, blood (Routine x 2)     Status: None   Collection Time: 07/13/16  1:15 PM  Result Value Ref Range Status   Specimen Description BLOOD RIGHT ANTECUBITAL  Final   Special Requests   Final    BOTTLES DRAWN AEROBIC AND ANAEROBIC Blood Culture adequate volume   Culture   Final    NO GROWTH 5 DAYS Performed at [facility], [address], [place], [place] [address]    Report Status 07/18/2016 FINAL  Final  Culture, blood (Routine  x 2)     Status: None   Collection Time: 07/13/16  1:16 PM  Result Value Ref Range Status   Specimen Description BLOOD LEFT ANTECUBITAL  Final   Special Requests   Final    BOTTLES DRAWN AEROBIC AND ANAEROBIC Blood Culture adequate volume   Culture   Final    NO GROWTH 5 DAYS Performed at [facility], [address] [place], [place] [address]    Report Status 07/18/2016 FINAL  Final  MRSA PCR Screening     Status: None   Collection Time: 07/13/16  5:24 PM  Result Value Ref Range Status   MRSA by PCR NEGATIVE NEGATIVE Final    Comment:        The GeneXpert MRSA Assay (FDA approved for NASAL  specimens only), is one component of a comprehensive MRSA colonization surveillance program. It is not intended to diagnose MRSA infection nor to guide or monitor treatment for MRSA infections.   Culture, expectorated sputum-assessment     Status: None   Collection Time: 07/14/16  4:49 PM  Result Value Ref Range Status   Specimen Description SPUTUM  Final   Special Requests NONE  Final   Sputum evaluation   Final    Sputum specimen not acceptable for testing.  Please recollect.   NOTIFIED SAPNA,RN [id number] @ [place]    Report Status 07/15/2016 FINAL  Final  Culture, expectorated sputum-assessment     Status: None   Collection Time: 07/16/16  6:01 AM  Result Value Ref Range Status   Specimen Description SPUTUM  Final   Special Requests NONE  Final   Sputum evaluation THIS SPECIMEN IS ACCEPTABLE FOR SPUTUM CULTURE  Final   Report Status 07/16/2016 FINAL  Final  Culture, respiratory (NON-Expectorated)     Status: Abnormal   Collection Time: 07/16/16  6:01 AM  Result Value Ref Range Status   Specimen Description SPUTUM  Final   Special Requests NONE Reflexed from [id number]  Final   Gram Stain   Final    RARE WBC PRESENT, PREDOMINANTLY PMN RARE GRAM POSITIVE COCCI IN PAIRS RARE GRAM POSITIVE RODS Performed at [facility], [place] [address] [place], [place] [address]    Culture MULTIPLE ORGANISMS PRESENT, NONE PREDOMINANT (A)  Final   Report Status 07/18/2016 FINAL  Final     Labs: Basic Metabolic Panel:  Recent Labs Lab 07/14/16 0340 07/15/16 0402 07/16/16 0349 07/17/16 0401 07/18/16 0322  NA 132* 131* 134* 134* 134*  K 4.3 4.2 4.1 3.2* 3.6  CL 101 97* 100* 101 100*  CO2 23 28 28 28 28   GLUCOSE 121* 141* 146* 136* 131*  BUN 18 16 16 14 11   CREATININE 0.77 0.59* 0.69 0.53* 0.54*  CALCIUM 8.1* 8.4* 8.4* 8.0* 7.9*  MG  --  1.7  --  1.6* 1.8   Liver Function Tests:  Recent Labs Lab 07/13/16 1127 07/13/16 1314 07/15/16 0402  AST 15 26 23   ALT 13  16* 19  ALKPHOS 75 78 65  BILITOT 0.7 0.8 0.6  PROT 5.5* 6.4* 5.6*  ALBUMIN 3.0* 3.2* 2.7*   No results for input(s): LIPASE, AMYLASE in the last 168 hours. No results for input(s): AMMONIA in the last 168 hours. CBC:  Recent Labs Lab 07/13/16 1127 07/13/16 1314 07/14/16 0340 07/15/16 0402 07/16/16 0349  WBC 20.8* 24.4* 19.3* 12.3* 9.1  NEUTROABS 19,344* 22.5*  --   --   --   HGB 10.8* 11.5* 9.3* 8.6*  9.1*  HCT 32.1* 33.8* 27.5* 25.0* 27.6*  MCV 95.3 94.2 93.9 92.6 91.4  PLT 339 324 286 237 251   Cardiac Enzymes: No results for input(s): CKTOTAL, CKMB, CKMBINDEX, TROPONINI in the last 168 hours. BNP: BNP (last 3 results)  Recent Labs  06/22/16 1706 07/13/16 1127  BNP 254.1* 145.0*    ProBNP (last 3 results) No results for input(s): PROBNP in the last 8760 hours.  CBG: No results for input(s): GLUCAP in the last 168 hours.     SignedFlorencia Reasons MD, PhD  [facility] 07/18/2016, 12:02 PM

## 2016-07-20 ENCOUNTER — Encounter: Payer: Self-pay | Admitting: Emergency Medicine

## 2016-07-20 ENCOUNTER — Ambulatory Visit (INDEPENDENT_AMBULATORY_CARE_PROVIDER_SITE_OTHER): Payer: BLUE CROSS/BLUE SHIELD | Admitting: Emergency Medicine

## 2016-07-20 DIAGNOSIS — J441 Chronic obstructive pulmonary disease with (acute) exacerbation: Secondary | ICD-10-CM

## 2016-07-20 MED ORDER — CLONAZEPAM 0.5 MG PO TABS: 0.2500 mg | ORAL_TABLET | Freq: Three times a day (TID) | ORAL | 0 refills | Status: DC | PRN

## 2016-07-20 MED ORDER — AEROCHAMBER MV MISC: 0 refills | Status: AC

## 2016-07-20 MED ORDER — TAMSULOSIN HCL 0.4 MG PO CAPS: 0.4000 mg | ORAL_CAPSULE | Freq: Every day | ORAL | 0 refills | Status: DC

## 2016-07-20 MED ORDER — SCOPOLAMINE 1 MG/3DAYS TD PT72: 1.0000 | MEDICATED_PATCH | TRANSDERMAL | 5 refills | Status: AC

## 2016-07-20 NOTE — Progress Notes (Signed)
Subjective:    Patient ID: Warren [name], male    DOB: 05-18-1955, 61 y.o.   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 Old chart note, recent hospital notes and PFT's reviewed > FEV1 was 1.25L (42% predicted) in 01/2011. He was hospitalized in April for PNA and AE-COPD. Had a MBS on 5/9 that confirmed delayed clearance, risk for aspiration but no overt aspiration. He is now on Advair + Spiriva. He uses albuterol nebs every few days. He quit smoking after the hospitalization, is on Chantix. He is feeling better. Notes that he has had frequent PNA's, a few a year. No wheeze or cough at this time. His wife notes that he is slowly putting on some weight. The target weight at this time is greater than 100 pounds     ROV 01/20/16 -- patient has a history of severe COPD, allergic rhinitis, dysphagia. At his last visit we attempted to taper his prednisone down to 10 mg daily. He needed to go back up to 15mg  because her experienced . He had an episode of sudden dyspnea and associated anxiety 3 days ago. States tha the has been jittery every since. Has been coughing more last 3 days, clear mucous. He is using flonase prn.   ROV 03/23/16 --  severe COPD, allergic rhinitis and chronic prednisone use. I treated him for an acute flare in October with a steroid taper. His current dose of prednisone is 20mg   He is on duoneb qid. He still has exertional SOB, episodic periods of severe dyspnea with chores, exertion. He is on flonase. Flu shot up to date.   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 He was just d/c from [facility], connected with hospice care. He is asking for refills of his klonopin, his tamsulosin due to urinary retention.     Review of Systems  Constitutional: Negative for fever and unexpected  weight change.  HENT: Positive for dental problem and trouble swallowing. Negative for congestion, ear pain, nosebleeds, postnasal drip, rhinorrhea, sinus pressure, sneezing and sore throat.   Eyes: Negative for redness and itching.  Respiratory: Positive for cough and shortness of breath. Negative for chest tightness and wheezing.   Cardiovascular: Negative for palpitations and leg swelling.  Gastrointestinal: Negative for nausea and vomiting.  Genitourinary: Negative for dysuria.  Musculoskeletal: Negative for joint swelling.  Skin: Negative for rash.  Neurological: Negative for headaches.  Hematological: Does not bruise/bleed easily.  Psychiatric/Behavioral: Negative for dysphoric mood. The patient is not nervous/anxious.          Objective:   Physical Exam Vitals:   07/20/16 1625  BP: 100/70  Pulse: 93  SpO2: 96%  Weight: 90 lb 12.8 oz (41.2 kg)  Height: 5\' 6"  (1.676 m)   Gen: Pleasant, very thin, in no distress,  normal affect  ENT: No lesions,  mouth clear,  oropharynx clear, no postnasal drip  Neck: No JVD, no TMG, no carotid bruits  Lungs: No use of accessory muscles, clear without rales or rhonchi  Cardiovascular: RRR, heart sounds normal, no murmur or gallops, no peripheral edema  Musculoskeletal: No deformities, no cyanosis or clubbing  Neuro: alert, non focal  Skin: Warm, no lesions or rashes     Assessment & Plan:  No problem-specific Assessment &  Plan notes found for this encounter.  Baltazar Apo, MD, PhD 07/20/2016, 4:30 PM [facility] [phone] or if no answer [phone]

## 2016-07-20 NOTE — Patient Instructions (Addendum)
Please use your Symbicort 2 puffs through a spacer twice a day Use your DuoNebs 3 times a day (4 times a dose if you are awake at that time) We will refill your klonopin and your tamsulosin. You will need to review these medications with Molli Barrows, NP and get your future scripts through her for hospice services.  Continue your prednisone at 40mg  daily for now. We will try to decrease in the future to see if your breathing tolerates.  Follow with Dr Lamonte Sakai in 2 months or sooner if you have any problems.

## 2016-07-20 NOTE — Addendum Note (Signed)
Addended by: Jannette Spanner on: 07/20/2016 05:00 PM   Modules accepted: Orders

## 2016-07-20 NOTE — Assessment & Plan Note (Signed)
End stage. Agree with Hospice care. I will fill the meds he needs until he can see his NP to get full prescriptions of klonopin and flomax. Continue symbicort, duoneb, O2 as ordered. Will get him a spacer.

## 2016-07-20 NOTE — Addendum Note (Signed)
Addended by: Jannette Spanner on: 07/20/2016 05:30 PM   Modules accepted: Orders

## 2016-07-25 ENCOUNTER — Other Ambulatory Visit: Payer: Self-pay | Admitting: Emergency Medicine

## 2016-07-27 NOTE — Telephone Encounter (Signed)
RB please advise of refill on the prednisone.  thanks

## 2016-08-01 ENCOUNTER — Telehealth: Payer: Self-pay

## 2016-08-01 ENCOUNTER — Telehealth: Payer: Self-pay | Admitting: Emergency Medicine

## 2016-08-01 ENCOUNTER — Other Ambulatory Visit: Payer: Self-pay

## 2016-08-01 MED ORDER — PREDNISONE 20 MG PO TABS: 20.0000 mg | ORAL_TABLET | Freq: Every day | ORAL | 0 refills | Status: DC

## 2016-08-01 MED ORDER — GUAIFENESIN ER 600 MG PO TB12: 1200.0000 mg | ORAL_TABLET | Freq: Two times a day (BID) | ORAL | 1 refills | Status: DC

## 2016-08-01 MED ORDER — MORPHINE SULFATE 20 MG/5ML PO SOLN: 2.5000 mg | ORAL | 0 refills | Status: DC | PRN

## 2016-08-01 NOTE — Telephone Encounter (Signed)
Per last AVS:  Please use your Symbicort 2 puffs through a spacer twice a day Use your DuoNebs 3 times a day (4 times a dose if you are awake at that time) We will refill your klonopin and your tamsulosin. You will need to review these medications with Molli Barrows, NP and get your future scripts through her for hospice services.  Continue your prednisone at 40mg  daily for now. We will try to decrease in the future to see if your breathing tolerates.  Follow with Dr Lamonte Sakai in 2 months or sooner if you have any problems.  Spoke with Langley Gauss with Hospice and notified of this  She will contact Molli Barrows, NP  Nothing further needed

## 2016-08-01 NOTE — Telephone Encounter (Signed)
Hospice nurse requested Morphine for pt. Rx printed awaiting RB signature.

## 2016-08-01 NOTE — Telephone Encounter (Signed)
Please phone hospice nurse back to inform her that medications have been prescribed and sent to [facility].

## 2016-08-07 ENCOUNTER — Telehealth: Payer: Self-pay | Admitting: Emergency Medicine

## 2016-08-07 MED ORDER — MORPHINE SULFATE 20 MG/5ML PO SOLN: 2.5000 mg | ORAL | 0 refills | Status: AC | PRN

## 2016-08-07 NOTE — Telephone Encounter (Signed)
rx reprinted with notation that this is a hospice patient.  [facility] [address] as they are the facility who called regarding rx this morning and NOT hospice.    Left detailed message on pharmacy advising that [facility] does not wish to change dosage, and that new rx specifying that this is a hospice pt has been refaxed to pharmacy.  Will close encounter.

## 2016-08-07 NOTE — Telephone Encounter (Signed)
Spoke with pharmacy, states that they do not have the morphine 20mg /70ml in stock, and would not be able to order it until tomorrow.  They do have 100mg /62mL in stock- want to know if we'd like to wait for rx to be ordered tomorrow or if we'd like to change rx to higher concentrated dose.   Also, rx needs to state this is a hospice pt.  Previous rx that was faxed did not state that this is a hospice pt.    RB please advise on rx.  Thanks!

## 2016-08-07 NOTE — Telephone Encounter (Signed)
Please resend the original prescription (original concentration and dosing) but include on the script that Mr Blough is a Hospice patient. Thanks

## 2016-08-11 ENCOUNTER — Telehealth: Payer: Self-pay | Admitting: Emergency Medicine

## 2016-08-11 NOTE — Telephone Encounter (Signed)
Spoke with Caryl Pina who prior to [place] calling us back sees that Lavell Anchors, NP is taking over pt's comfort medications, so she has already spoken with their office about pt's prednisone which was last prescribed by Maudie Mercury. She stated she did not need nothing from [place]. Will close this message. Nothing further is needed

## 2016-08-15 ENCOUNTER — Ambulatory Visit (INDEPENDENT_AMBULATORY_CARE_PROVIDER_SITE_OTHER): Payer: BLUE CROSS/BLUE SHIELD | Admitting: Family Medicine

## 2016-08-15 ENCOUNTER — Encounter: Payer: Self-pay | Admitting: Family Medicine

## 2016-08-15 ENCOUNTER — Ambulatory Visit: Payer: BLUE CROSS/BLUE SHIELD | Admitting: Family Medicine

## 2016-08-15 VITALS — BP 108/68 | HR 118 | Temp 97.4°F | Resp 16 | Ht 67.0 in | Wt 95.6 lb

## 2016-08-15 DIAGNOSIS — J449 Chronic obstructive pulmonary disease, unspecified: Secondary | ICD-10-CM | POA: Diagnosis not present

## 2016-08-15 DIAGNOSIS — G8929 Other chronic pain: Secondary | ICD-10-CM

## 2016-08-15 DIAGNOSIS — M546 Pain in thoracic spine: Secondary | ICD-10-CM

## 2016-08-15 DIAGNOSIS — M62838 Other muscle spasm: Secondary | ICD-10-CM

## 2016-08-15 LAB — POCT URINALYSIS DIP (DEVICE)
Bilirubin Urine: NEGATIVE
Glucose, UA: NEGATIVE mg/dL
Ketones, ur: NEGATIVE mg/dL
Leukocytes, UA: NEGATIVE
NITRITE: NEGATIVE
PH: 6 (ref 5.0–8.0)
PROTEIN: NEGATIVE mg/dL
Specific Gravity, Urine: 1.015 (ref 1.005–1.030)
Urobilinogen, UA: 0.2 mg/dL (ref 0.0–1.0)

## 2016-08-15 LAB — POTASSIUM: POTASSIUM: 4.8 mmol/L (ref 3.5–5.3)

## 2016-08-15 MED ORDER — PREDNISONE 20 MG PO TABS: 40.0000 mg | ORAL_TABLET | Freq: Every day | ORAL | 0 refills | Status: DC

## 2016-08-15 MED ORDER — CLONAZEPAM 0.5 MG PO TABS: 0.2500 mg | ORAL_TABLET | Freq: Three times a day (TID) | ORAL | 0 refills | Status: DC | PRN

## 2016-08-15 NOTE — Progress Notes (Signed)
Patient ID: Warren [name], male    DOB: 03-02-1956, 61 y.o.   MRN: [id number]  PCP: Warren Jun, FNP  Chief Complaint  Patient presents with  . Follow-up    SOB  . Back Pain    Subjective:  HPI Warren [name] is a 61 y.o. male presents for a routine follow-up.  Medical problems include: Hypoxia, COPD, CHF, Spinocerebellar ataxia type 6, and tobacco use. Warren [name] was admitted to the hospital 07/13/2016 for HCAP and was subsequently made a DNR and discharge home  to hospice care.  Warren [name] reports that since his last hospital admission he feels his condition has improved. He is eating at least twice daily, drinking ensure, and has even returned to his home and rode his 4-wheeler once.  Continues to be dependent on 2 liters of oxygen continuously. He continues to experience back pain and leg twitching.   Through hospice he is prescribed morphine syrup although he reports that he is afraid to take morphine as he feels this medication will kill him. He is currently taking ibuprofen for back pain and only on rare occasions has he taken the morphine. During his initial office visit, he was prescribed Klonopin for anxiety. He takes as needed. Needs refills on chronic medications. No other acute concerns at present.   Social History   Social History  . Marital status: Married    Spouse name: N/A  . Number of children: 1  . Years of education: BS   Occupational History  . 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 Smoking status: Former Smoker    Packs/day: 1.50    Years: 30.00    Quit date: 07/25/2014  . Smokeless tobacco: Never Used     Comment: pt says he is smoking 5 or 6 cigarettes daily  . Alcohol use No     Comment: 2 beers monthly  . Drug use: No  . Sexual activity: No   Other Topics Concern  . Not on file   Social History Narrative   Marital status: married x 31 years.      Children:  One child (25); no grandchildren.       Lives: with wife, son.  Has a barn; cats.      Employment: unemployed in 2015; previous truck driver x 30 years; hauls beer.  Drives locally     Tobacco: 1 ppd x 30 years      Alcohol:  Rare/none      Drugs: none      Exercise: sporadic      Seatbelt:  100%      Guns:  Unloaded.   Patient is right handed.   Patient drinks 3-4 cups of caffeine daily.    Family History  Problem Relation Age of Onset  . Heart disease Father     valve replacement; CHF; heart transplant candidate  . Hypertension Mother   . Hyperlipidemia Mother   . Diabetes Mother   . Stroke Mother 30    cause of death.  . Diabetes Sister   . Hyperlipidemia Sister   . Stroke Brother    Review of Systems See HPI  Patient Active Problem List   Diagnosis Date Noted  . Malnutrition of moderate degree 07/14/2016  . HCAP (healthcare-associated pneumonia) 07/13/2016  . Acute on chronic respiratory failure with hypoxia ([place]) 07/13/2016  . Protein-calorie malnutrition, severe 06/23/2016  . 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 Hypokalemia 06/22/2016  . Acute respiratory failure with hypoxia ([place]) 06/22/2016  . Chest pain 07/25/2014  . 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 Dysphagia, pharyngoesophageal phase 06/15/2014  . Raynaud disease 06/15/2014  . Dysarthria 02/16/2014  . 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 BPH (benign prostatic hyperplasia) 09/28/2013  . Personal history of colonic polyps 12/02/2012  . Pneumonia 09/29/2012  . Hyponatremia 09/29/2012  . Loss of weight 09/29/2012  . Anemia 09/17/2012  . COPD (chronic obstructive pulmonary disease) ([place]) 12/21/2010  . Tobacco abuse 12/21/2010    Allergies  Allergen Reactions  . Contrast Media [Iodinated Diagnostic Agents] Hives and Itching  . Iodine Hives and Itching    Prior to Admission medications   Medication Sig Start Date End Date Taking? Authorizing Provider  albuterol (PROVENTIL  HFA;VENTOLIN HFA) 108 (90 Base) MCG/ACT inhaler Inhale 2 puffs into the lungs as needed for wheezing or shortness of breath. Patient taking differently: Inhale 2 puffs into the lungs every 6 (six) hours as needed for wheezing or shortness of breath.  03/23/16  Yes Collene Gobble, MD  albuterol (PROVENTIL) (2.5 MG/3ML) 0.083% nebulizer solution Take 3 mLs (2.5 mg total) by nebulization every 6 (six) hours as needed for wheezing or shortness of breath. 07/09/16  Yes Warren Jun, FNP  budesonide-formoterol (SYMBICORT) 160-4.5 MCG/ACT inhaler Inhale 2 puffs into the lungs 2 (two) times daily. 06/13/16  Yes Juanito Doom, MD  clonazePAM (KLONOPIN) 0.5 MG tablet Take 0.5 tablets (0.25 mg total) by mouth 3 (three) times daily as needed (anxiety). 07/20/16  Yes Collene Gobble, MD  dextromethorphan-guaiFENesin [facility] DM) 30-600 MG 12hr tablet Take 1 tablet by mouth daily.   Yes [provider]  feeding supplement, ENSURE ENLIVE, (ENSURE ENLIVE) LIQD Take 237 mLs by mouth 3 (three) times daily between meals. 07/18/16  Yes Florencia Reasons, MD  fluticasone [facility]) 50 MCG/ACT nasal spray Place 2 sprays into both nostrils daily as needed for rhinitis.    Yes [provider]  hydrOXYzine (VISTARIL) 50 MG capsule Take 1-2 capsules (50-100 mg total) by mouth 2 (two) times daily as needed for anxiety. 07/04/16  Yes Warren Jun, FNP  ibuprofen (ADVIL,MOTRIN) 200 MG tablet Take 400 mg by mouth every 6 (six) hours as needed for mild pain or moderate pain.   Yes [provider]  ipratropium (ATROVENT) 0.02 % nebulizer solution Take 0.5 mg by nebulization every 6 (six) hours as needed for wheezing or shortness of breath.   Yes [provider]  metoprolol succinate (TOPROL XL) 25 MG 24 hr tablet Take 0.5 tablets (12.5 mg total) by mouth daily. Patient taking differently: Take 12.5 mg by mouth at bedtime.  06/26/16  Yes Nita Sells, MD  morphine 20 MG/5ML solution Take 0.6  mLs (2.4 mg total) by mouth every 4 (four) hours as needed for pain. *HOSPICE PATIENT* 08/07/16  Yes Collene Gobble, MD  predniSONE (DELTASONE) 20 MG tablet Take 1 tablet (20 mg total) by mouth daily before breakfast. 08/01/16  Yes Warren Jun, FNP  scopolamine (TRANSDERM-SCOP, 1.5 MG,) 1 MG/3DAYS Place 1 patch (1.5 mg total) onto the skin every 3 (three) days. 07/20/16  Yes Collene Gobble, MD  Spacer/Aero-Holding Chambers (AEROCHAMBER MV) inhaler Use as instructed 07/20/16  Yes Collene Gobble, MD  tamsulosin (FLOMAX) 0.4 MG CAPS capsule Take 1 capsule (0.4 mg total) by mouth daily. 07/20/16  Yes Collene Gobble, MD    Past Medical, Surgical  Family and Social History reviewed and updated.    Objective:   Today's Vitals   08/15/16 1522  BP: 108/68  Pulse: (!) 118  Resp: 16  Temp: 97.4 F (36.3 C)  TempSrc: Oral  SpO2: 92%  Weight: 95 lb 9.6 oz (43.4 kg)  Height: 5\' 7"  (1.702 m)    Wt Readings from Last 3 Encounters:  08/15/16 95 lb 9.6 oz (43.4 kg)  07/20/16 90 lb 12.8 oz (41.2 kg)  07/13/16 90 lb 6.2 oz (41 kg)    Physical Exam  Constitutional: He is oriented to person, place, and time. He appears well-developed and well-nourished.  HENT:  Head: Normocephalic and atraumatic.  Eyes: Conjunctivae are normal. Pupils are equal, round, and reactive to light.  Neck: Normal range of motion. Neck supple.  Cardiovascular: Normal rate, regular rhythm, normal heart sounds and intact distal pulses.   Pulmonary/Chest: He has decreased breath sounds in the right upper field, the right middle field, the right lower field, the left upper field, the left middle field and the left lower field. He has wheezes.  Oxygen dependent on 2 Liters  Musculoskeletal:  Wheel chaired bound  Neurological: He is alert and oriented to person, place, and time.  Skin: Skin is warm and dry.  Psychiatric: He has a normal mood and affect. His behavior is normal. Judgment and thought content normal.     Assessment & Plan:  1. Muscle spasm of left lower extremity - Potassium - Magnesium -Take Klonopin at bedtime. Leg twitching may be related to anxiety at bedtime.  2. Chronic obstructive pulmonary disease, unspecified COPD type ([place]) -Continue nebulizer treatments as prescribed -Refilled prednisone 20 mg twice daily  -Continue guaifenesin 600 mg twice daily  3. Chronic bilateral thoracic back pain -Take morphine and or ibuprofen   RTC: 3 months for chronic disease management   Carroll Sage. Kenton Kingfisher, MSN, [facility] [address] [place], [place] [address] [phone]

## 2016-08-15 NOTE — Patient Instructions (Addendum)
I am checking your potassium and magnesium due to the leg spasms.   For now take Klonopin 0.5 three times daily to help with anxiety and leg spasms.  Continue all other medications as prescribed.  Encourage the use of briefs at bedtime to prevent incontinent episodes.  Continue all medications as prescribed.  I will see you for follow-up in 3 months.

## 2016-08-16 LAB — MAGNESIUM: Magnesium: 1.8 mg/dL (ref 1.5–2.5)

## 2016-08-18 ENCOUNTER — Telehealth: Payer: Self-pay

## 2016-08-18 NOTE — Telephone Encounter (Signed)
Patient notified

## 2016-08-18 NOTE — Telephone Encounter (Signed)
He is currently prescribed Symbicort and shouldn't need Dulera unless Pulmonology indicates that this medication would be beneficial.

## 2016-08-18 NOTE — Telephone Encounter (Signed)
Patient is requesting a script for [facility].

## 2016-08-21 ENCOUNTER — Other Ambulatory Visit: Payer: Self-pay

## 2016-08-21 MED ORDER — TAMSULOSIN HCL 0.4 MG PO CAPS: 0.4000 mg | ORAL_CAPSULE | Freq: Every day | ORAL | 0 refills | Status: DC

## 2016-08-21 NOTE — Telephone Encounter (Signed)
Rx sent for Flomax.

## 2016-08-24 ENCOUNTER — Other Ambulatory Visit: Payer: Self-pay | Admitting: Physician Assistant

## 2016-08-24 ENCOUNTER — Other Ambulatory Visit: Payer: Self-pay | Admitting: Emergency Medicine

## 2016-08-24 NOTE — Telephone Encounter (Signed)
Please call patient. Rx authorized. Needs to schedule follow-up and establish with new PCP (he has seen Dr. Tamala Julian for his last 3 visits, but hasn't been seen in 12 months).  Meds ordered this encounter  Medications  . ipratropium (ATROVENT) 0.02 % nebulizer solution    Sig: USE 1 VIAL IN NEBULIZER 4 (FOUR) TIMES DAILY.    Dispense:  250 mL    Refill:  0    Please notify patient that s/he needs an office visit +/- labsfor additional refills.

## 2016-09-18 ENCOUNTER — Other Ambulatory Visit: Payer: Self-pay | Admitting: Family Medicine

## 2016-09-18 ENCOUNTER — Other Ambulatory Visit: Payer: Self-pay | Admitting: Physician Assistant

## 2016-09-18 DIAGNOSIS — M62838 Other muscle spasm: Secondary | ICD-10-CM

## 2016-09-20 ENCOUNTER — Other Ambulatory Visit: Payer: Self-pay | Admitting: Emergency Medicine

## 2016-09-25 ENCOUNTER — Other Ambulatory Visit: Payer: Self-pay | Admitting: Emergency Medicine

## 2016-09-26 ENCOUNTER — Other Ambulatory Visit: Payer: Self-pay | Admitting: Emergency Medicine

## 2016-10-04 ENCOUNTER — Encounter: Payer: Self-pay | Admitting: Emergency Medicine

## 2016-10-04 ENCOUNTER — Ambulatory Visit (INDEPENDENT_AMBULATORY_CARE_PROVIDER_SITE_OTHER): Payer: BLUE CROSS/BLUE SHIELD | Admitting: Emergency Medicine

## 2016-10-04 DIAGNOSIS — J449 Chronic obstructive pulmonary disease, unspecified: Secondary | ICD-10-CM

## 2016-10-04 NOTE — Patient Instructions (Signed)
Please continue your inhaled medications as you have been taking them Continue oxygen at 2 L/m Continue prednisone 40 mg daily Agree with your clonazepam and morphine dosing as currently ordered Continue to follow with and take advantage of hospice resources Follow with Dr Lamonte Sakai in 3 months or sooner if you have any problems.

## 2016-10-04 NOTE — Assessment & Plan Note (Signed)
Please continue your inhaled medications as you have been taking them Continue oxygen at 2 L/m Continue prednisone 40 mg daily Agree with your clonazepam and morphine dosing as currently ordered Continue to follow with and take advantage of hospice resources Follow with Dr Lamonte Sakai in 3 months or sooner if you have any problems.

## 2016-10-04 NOTE — Progress Notes (Signed)
Subjective:    Patient ID: Warren [name], male    DOB: 1956-02-20, 61 y.o.   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 Old chart note, recent hospital notes and PFT's reviewed > FEV1 was 1.25L (42% predicted) in 01/2011. He was hospitalized in April for PNA and AE-COPD. Had a MBS on 5/9 that confirmed delayed clearance, risk for aspiration but no overt aspiration. He is now on Advair + Spiriva. He uses albuterol nebs every few days. He quit smoking after the hospitalization, is on Chantix. He is feeling better. Notes that he has had frequent PNA's, a few a year. No wheeze or cough at this time. His wife notes that he is slowly putting on some weight. The target weight at this time is greater than 100 pounds     ROV 01/20/16 -- patient has a history of severe COPD, allergic rhinitis, dysphagia. At his last visit we attempted to taper his prednisone down to 10 mg daily. He needed to go back up to 15mg  because her experienced . He had an episode of sudden dyspnea and associated anxiety 3 days ago. States tha the has been jittery every since. Has been coughing more last 3 days, clear mucous. He is using flonase prn.   ROV 03/23/16 --  severe COPD, allergic rhinitis and chronic prednisone use. I treated him for an acute flare in October with a steroid taper. His current dose of prednisone is 20mg   He is on duoneb qid. He still has exertional SOB, episodic periods of severe dyspnea with chores, exertion. He is on flonase. Flu shot up to date.   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 He was just d/c from [facility], connected with hospice care. He is asking for refills of his klonopin, his tamsulosin due to urinary retention.   ROV 10/04/16 -- Warren [name] has a history of end-stage COPD. I last saw him  in April after he had been hospitalized. He is now receiving hospice care.  He is on pred 40mg  qd. His clonazepam was just increased to 0.5mg  tid, hasn't started this yet. He is on duoneb scheduled. Oxygen at 2L/min. Has not needed to be admitted, no exacerbations. He is off metoprolol. Using morphine prn.      Review of Systems  Constitutional: Negative for fever and unexpected weight change.  HENT: Positive for dental problem and trouble swallowing. Negative for congestion, ear pain, nosebleeds, postnasal drip, rhinorrhea, sinus pressure, sneezing and sore throat.   Eyes: Negative for redness and itching.  Respiratory: Positive for cough and shortness of breath. Negative for chest tightness and wheezing.   Cardiovascular: Negative for palpitations and leg swelling.  Gastrointestinal: Negative for nausea and vomiting.  Genitourinary: Negative for dysuria.  Musculoskeletal: Negative for joint swelling.  Skin: Negative for rash.  Neurological: Negative for headaches.  Hematological: Does not bruise/bleed easily.  Psychiatric/Behavioral: Negative for dysphoric mood. The patient is not nervous/anxious.          Objective:   Physical Exam Vitals:   10/04/16 1601 10/04/16 1602  BP:  106/64  Pulse:  93  SpO2:  95%  Weight: 93 lb (42.2 kg)   Height: 5\' 8"  (1.727 m)    Gen: Pleasant, Cachectic man, in no distress,  normal affect, in  a wheelchair,   ENT: No lesions,  mouth clear,  oropharynx clear, no postnasal drip, weak voice  Neck: No JVD, no TMG, no carotid bruits  Lungs: No use of accessory muscles, clear without rales or rhonchi  Cardiovascular: RRR, heart sounds normal, no murmur or gallops, no peripheral edema  Musculoskeletal: No deformities, no cyanosis or clubbing  Neuro: alert, non focal  Skin: Warm, no lesions or rashes     Assessment & Plan:  COPD (chronic obstructive pulmonary disease) (HCC) Please continue your inhaled medications as you have been taking  them Continue oxygen at 2 L/m Continue prednisone 40 mg daily Agree with your clonazepam and morphine dosing as currently ordered Continue to follow with and take advantage of hospice resources Follow with Warren [name] in 3 months or sooner if you have any problems.  Baltazar Apo, MD, PhD 10/04/2016, 4:42 PM [facility] [phone] or if no answer [phone]

## 2016-10-16 ENCOUNTER — Other Ambulatory Visit: Payer: Self-pay | Admitting: Family Medicine

## 2016-10-16 DIAGNOSIS — M62838 Other muscle spasm: Secondary | ICD-10-CM

## 2016-11-02 ENCOUNTER — Telehealth: Payer: Self-pay | Admitting: Emergency Medicine

## 2016-11-02 NOTE — Telephone Encounter (Signed)
Spoke with susan with hospice, who request a verbal to write up a DNR for pt. Manuela Schwartz states she will write DNR, DNR will be faxed over for RB to review. Once RB has reviewed DNR, someone from their office will bring it by our office for RB to sign.  RB please advise. Thanks.

## 2016-11-03 NOTE — Telephone Encounter (Signed)
Spoke with Vinnie Level with Hospice. She is aware that RB is okay with this DNR. The Hospice MD can't sign the DNR as [place]. Hospice will bring the DNR by our office to be signed by RB.

## 2016-11-03 NOTE — Telephone Encounter (Signed)
We can do an order here and send it. Or they can take verbal order from me, bring it to me or have the hospice MD sign it.

## 2016-11-12 ENCOUNTER — Other Ambulatory Visit: Payer: Self-pay | Admitting: Family Medicine

## 2016-11-14 ENCOUNTER — Ambulatory Visit (INDEPENDENT_AMBULATORY_CARE_PROVIDER_SITE_OTHER): Payer: BLUE CROSS/BLUE SHIELD | Admitting: Family Medicine

## 2016-11-14 ENCOUNTER — Encounter: Payer: Self-pay | Admitting: Family Medicine

## 2016-11-14 VITALS — BP 100/68 | HR 100 | Temp 97.4°F | Resp 18 | Ht 68.0 in | Wt 92.0 lb

## 2016-11-14 DIAGNOSIS — K30 Functional dyspepsia: Secondary | ICD-10-CM | POA: Diagnosis not present

## 2016-11-14 DIAGNOSIS — J449 Chronic obstructive pulmonary disease, unspecified: Secondary | ICD-10-CM

## 2016-11-14 MED ORDER — PREDNISONE 20 MG PO TABS: ORAL_TABLET | ORAL | 0 refills | Status: DC

## 2016-11-14 MED ORDER — SUCRALFATE 1 GM/10ML PO SUSP: 1.0000 g | Freq: Three times a day (TID) | ORAL | 0 refills | Status: DC

## 2016-11-14 MED ORDER — HYDROCODONE-HOMATROPINE 5-1.5 MG/5ML PO SYRP: 2.5000 mL | ORAL_SOLUTION | ORAL | 0 refills | Status: AC | PRN

## 2016-11-14 NOTE — Progress Notes (Signed)
Patient ID: Warren [name], male    DOB: 1955/12/02, 61 y.o.   MRN: [id number]  PCP: Scot Jun, FNP  Chief Complaint  Patient presents with  . Follow-up    3 MONTH     Subjective:  HPI Warren [name] is a 61 y.o. male presents for routine 3 month follow-up of chronic conditions. 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 He is currently under hospice care in the home setting. He is accompanied today by his wife. Warren [name] reports worsening coughing, rib pain right side only, and coughing up brownish type sputum. He is receiving chronic morphine for pain. Since his last office visit, he has developed generalized weakness and now requires assistance to ambulate. He is maintaining oxygen saturations in the 90's on 2 liters of oxygen. Warren [name] also receives nebulizer treatments every 6 hours when he reports improves overall work of breathing. He continues to experience decreased appetite as eating causes fatigue. He has maintained an average weight of 92 lbs, since his last visit. Warren [name] complains of heartburn.  The symptoms are most persistent after he takes medication or eats.  Social History   Social History  . Marital status: Married    Spouse name: N/A  . Number of children: 1  . Years of education: BS   Occupational History  . 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 Smoking status: Former Smoker    Packs/day: 1.50    Years: 30.00    Quit date: 07/25/2014  . Smokeless tobacco: Never Used     Comment: pt says he is smoking 5 or 6 cigarettes daily  . Alcohol use No     Comment: 2 beers monthly  . Drug use: No  . Sexual activity: No   Other Topics Concern  . Not on file   Social History Narrative   Marital status: married x 31 years.      Children:  One child (25); no grandchildren.      Lives: with wife, son.  Has a barn; cats.      Employment: unemployed in 2015; previous truck driver x 30 years; hauls beer.  Drives locally     Tobacco: 1 ppd x 30 years       Alcohol:  Rare/none      Drugs: none      Exercise: sporadic      Seatbelt:  100%      Guns:  Unloaded.   Patient is right handed.   Patient drinks 3-4 cups of caffeine daily.    Family History  Problem Relation Age of Onset  . Heart disease Father        valve replacement; CHF; heart transplant candidate  . Hypertension Mother   . Hyperlipidemia Mother   . Diabetes Mother   . Stroke Mother 26       cause of death.  . Diabetes Sister   . Hyperlipidemia Sister   . Stroke Brother    Review of Systems See HPI Patient Active Problem List   Diagnosis Date Noted  . Malnutrition of moderate degree 07/14/2016  . HCAP (healthcare-associated pneumonia) 07/13/2016  . Acute on chronic respiratory failure with hypoxia ([place]) 07/13/2016  . Protein-calorie malnutrition, severe 06/23/2016  . Chronic combined systolic and diastolic CHF (congestive heart failure) ([place]) 06/22/2016  . Hypokalemia 06/22/2016  . Acute respiratory failure with hypoxia ([place]) 06/22/2016  . Chest pain 07/25/2014  . 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  Dysphagia, pharyngoesophageal phase 06/15/2014  . Raynaud disease 06/15/2014  . Dysarthria 02/16/2014  . 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 BPH (benign prostatic hyperplasia) 09/28/2013  . Personal history of colonic polyps 12/02/2012  . Pneumonia 09/29/2012  . Hyponatremia 09/29/2012  . Loss of weight 09/29/2012  . Anemia 09/17/2012  . COPD (chronic obstructive pulmonary disease) ([place]) 12/21/2010  . Tobacco abuse 12/21/2010    Allergies  Allergen Reactions  . Contrast Media [Iodinated Diagnostic Agents] Hives and Itching  . Iodine Hives and Itching    Prior to Admission medications   Medication Sig Start Date End Date Taking? Authorizing Provider  albuterol (PROVENTIL HFA;VENTOLIN HFA) 108 (90 Base) MCG/ACT inhaler Inhale 2 puffs into the lungs as needed for wheezing or shortness of breath. Patient taking differently: Inhale 2 puffs into the  lungs every 6 (six) hours as needed for wheezing or shortness of breath.  03/23/16  Yes Collene Gobble, MD  albuterol (PROVENTIL) (2.5 MG/3ML) 0.083% nebulizer solution Take 3 mLs (2.5 mg total) by nebulization every 6 (six) hours as needed for wheezing or shortness of breath. 07/09/16  Yes Scot Jun, FNP  clonazePAM (KLONOPIN) 0.5 MG tablet TAKE 1/2 TAB BY MOUTH THREE TIMES PER DAY AS NEEDED 09/19/16  Yes Scot Jun, FNP  dextromethorphan-guaiFENesin [facility] DM) 30-600 MG 12hr tablet Take 1 tablet by mouth daily.   Yes [provider]  feeding supplement, ENSURE ENLIVE, (ENSURE ENLIVE) LIQD Take 237 mLs by mouth 3 (three) times daily between meals. 07/18/16  Yes Florencia Reasons, MD  fluticasone [facility]) 50 MCG/ACT nasal spray Place 2 sprays into both nostrils daily as needed for rhinitis.    Yes [provider]  hydrOXYzine (VISTARIL) 50 MG capsule Take 1-2 capsules (50-100 mg total) by mouth 2 (two) times daily as needed for anxiety. 07/04/16  Yes Scot Jun, FNP  ibuprofen (ADVIL,MOTRIN) 200 MG tablet Take 400 mg by mouth every 6 (six) hours as needed for mild pain or moderate pain.   Yes [provider]  ipratropium (ATROVENT) 0.02 % nebulizer solution Take 0.5 mg by nebulization every 6 (six) hours as needed for wheezing or shortness of breath.   Yes [provider]  ipratropium (ATROVENT) 0.02 % nebulizer solution USE 1 VIAL IN NEBULIZER 4 (FOUR) TIMES DAILY. 11/13/16  Yes Scot Jun, FNP  metoprolol succinate (TOPROL XL) 25 MG 24 hr tablet Take 0.5 tablets (12.5 mg total) by mouth daily. Patient taking differently: Take 12.5 mg by mouth at bedtime.  06/26/16  Yes Nita Sells, MD  morphine 20 MG/5ML solution Take 0.6 mLs (2.4 mg total) by mouth every 4 (four) hours as needed for pain. *HOSPICE PATIENT* 08/07/16  Yes Collene Gobble, MD  predniSONE (DELTASONE) 20 MG tablet Take 2 tablets (40 mg total) by mouth daily with breakfast.  08/15/16  Yes Scot Jun, FNP  scopolamine (TRANSDERM-SCOP, 1.5 MG,) 1 MG/3DAYS Place 1 patch (1.5 mg total) onto the skin every 3 (three) days. 07/20/16  Yes Collene Gobble, MD  Spacer/Aero-Holding Chambers (AEROCHAMBER MV) inhaler Use as instructed 07/20/16  Yes Collene Gobble, MD  tamsulosin (FLOMAX) 0.4 MG CAPS capsule Take 1 capsule (0.4 mg total) by mouth daily. 08/21/16  Yes Collene Gobble, MD    Past Medical, Surgical Family and Social History reviewed and updated.    Objective:   Today's Vitals   11/14/16 1514  BP: 100/68  Pulse: 100  Resp: 18  Temp: (!) 97.4 F (36.3 C)  TempSrc:  Oral  SpO2: 96%  Weight: 92 lb (41.7 kg)  Height: 5\' 8"  (1.727 m)    Wt Readings from Last 3 Encounters:  11/14/16 92 lb (41.7 kg)  10/04/16 93 lb (42.2 kg)  08/15/16 95 lb 9.6 oz (43.4 kg)   Physical Exam  Constitutional: He is oriented to person, place, and time. He appears well-developed and well-nourished.  HENT:  Head: Normocephalic and atraumatic.  Eyes: Pupils are equal, round, and reactive to light. Conjunctivae and EOM are normal.  Neck: Normal range of motion. Neck supple.  Cardiovascular: Normal rate, regular rhythm, normal heart sounds and intact distal pulses.   Pulmonary/Chest: No respiratory distress. He has decreased breath sounds in the right upper field, the right middle field, the right lower field, the left upper field, the left middle field and the left lower field. He has rhonchi in the right upper field, the right middle field, the left middle field and the left lower field. He exhibits tenderness.  Oxygen dependent, 2 liter via nasal canula   Abdominal: Soft. Bowel sounds are normal.  Musculoskeletal: Normal range of motion.  Neurological: He is alert and oriented to person, place, and time.  Skin: Skin is warm and dry.  Psychiatric: He has a normal mood and affect. His behavior is normal. Judgment and thought content normal.   Assessment & Plan:  1.  Chronic obstructive pulmonary disease, unspecified COPD type ([place]) - predniSONE (DELTASONE) 20 MG tablet; Increase dose to 40 mg daily for 5 days then resume 20 mg daily.  Dispense: 10 tablet; Refill: 0 -Hycodan cough syrup, 2.5 ml every, 4 hours as needed   2. Indigestion  -sucralfate (Carafate) 1 Gram tablet, 3 times daily with meals   RTC: 3 months follow-up for chronic conditions   Carroll Sage. Kenton Kingfisher, MSN, FNP-C [facility]  [address] [place], [place] [address] [phone]

## 2016-11-14 NOTE — Patient Instructions (Addendum)
Increase prednisone 40 mg once daily for 5 days then resume daily dose of Prednisone 20 mg daily.  I have prescribed Hycodan syrup 2.5 ml every 4 hours as needed.   For heartburn I have prescribed, Carafate  10 mls prior to each meal to reduce or prevent heartburn.

## 2016-11-15 ENCOUNTER — Telehealth: Payer: Self-pay

## 2016-11-15 MED ORDER — SUCRALFATE 1 G PO TABS: 1.0000 g | ORAL_TABLET | Freq: Three times a day (TID) | ORAL | 1 refills | Status: DC

## 2016-11-15 MED ORDER — PREDNISONE 20 MG PO TABS: ORAL_TABLET | ORAL | 0 refills | Status: DC

## 2016-12-06 ENCOUNTER — Other Ambulatory Visit: Payer: Self-pay

## 2016-12-06 MED ORDER — DM-GUAIFENESIN ER 30-600 MG PO TB12: 1.0000 | ORAL_TABLET | Freq: Every day | ORAL | 2 refills | Status: AC

## 2016-12-10 ENCOUNTER — Other Ambulatory Visit: Payer: Self-pay | Admitting: Family Medicine

## 2016-12-15 ENCOUNTER — Telehealth: Payer: Self-pay | Admitting: Emergency Medicine

## 2016-12-15 ENCOUNTER — Other Ambulatory Visit: Payer: Self-pay | Admitting: Family Medicine

## 2016-12-15 DIAGNOSIS — M62838 Other muscle spasm: Secondary | ICD-10-CM

## 2016-12-15 NOTE — Telephone Encounter (Signed)
Called and spoke with the pharmacy and they are aware that they will need to send this refill to the pts PCP for refills.  Not being refilled by RB>

## 2016-12-26 ENCOUNTER — Telehealth: Payer: Self-pay | Admitting: Emergency Medicine
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
## 2016-12-27 MED ORDER — FAMOTIDINE 20 MG PO TABS: 20.0000 mg | ORAL_TABLET | Freq: Two times a day (BID) | ORAL | 2 refills | Status: AC

## 2016-12-27 NOTE — Telephone Encounter (Signed)
Both pt and Warren [name] have been made aware of RB's recommendations. Rx has been sent to preferred pharmacy.  Nothing further needed.

## 2016-12-27 NOTE — Telephone Encounter (Signed)
Spoke with [employer]. She stated that the patient is having a lot of indigestion, even with the Carafate. She is concerned about this possibly affecting his breathing.   Pt wishes to use [facility] on [address].

## 2016-12-27 NOTE — Telephone Encounter (Signed)
Let them know that other options would be famotidine 20mg  twice a day OR omeprazole 20mg  twice a day. We can order either of these for him if he would like to try. Do the pepcid first if he has no preference.

## 2016-12-27 NOTE — Telephone Encounter (Signed)
Warren [name] returning call from nurse from yesterday, says she never received call back , I guess due to phon problems, she can be reached @ [phone].Warren [name]

## 2017-01-01 ENCOUNTER — Telehealth: Payer: Self-pay | Admitting: Emergency Medicine

## 2017-01-01 ENCOUNTER — Ambulatory Visit: Payer: BLUE CROSS/BLUE SHIELD | Admitting: Emergency Medicine

## 2017-01-01 DIAGNOSIS — J449 Chronic obstructive pulmonary disease, unspecified: Secondary | ICD-10-CM

## 2017-01-01 MED ORDER — TAMSULOSIN HCL 0.4 MG PO CAPS: 0.4000 mg | ORAL_CAPSULE | Freq: Every day | ORAL | 2 refills | Status: AC

## 2017-01-01 MED ORDER — PREDNISONE 20 MG PO TABS: ORAL_TABLET | ORAL | 2 refills | Status: AC

## 2017-01-01 NOTE — Telephone Encounter (Signed)
rx's sent to preferred pharmacy.   atc Hospice to make aware, received after hours call service.   Spoke with pt, aware of refill.  Nothing further needed.

## 2017-01-01 NOTE — Telephone Encounter (Signed)
Spoke with Warren [name] and clarified what pt is needing.the refills for Prednisone for his breathing and Tamulosin for his bladder. She states she went over there and he was very anxious about his refills. RB can you refill for pt? The original order for pt's medication was written by Molli Barrows. Please advise.

## 2017-01-01 NOTE — Telephone Encounter (Signed)
It is okay for [place] to refill both medications as ordered.

## 2017-01-10 ENCOUNTER — Other Ambulatory Visit: Payer: Self-pay | Admitting: Pulmonary Disease

## 2017-01-20 ENCOUNTER — Other Ambulatory Visit: Payer: Self-pay | Admitting: Family Medicine

## 2017-02-10 ENCOUNTER — Other Ambulatory Visit: Payer: Self-pay | Admitting: Family Medicine

## 2017-02-14 ENCOUNTER — Ambulatory Visit: Payer: BLUE CROSS/BLUE SHIELD | Admitting: Family Medicine

## 2017-02-22 ENCOUNTER — Other Ambulatory Visit: Payer: Self-pay | Admitting: Family Medicine

## 2017-03-10 DEATH — deceased

## 2017-07-05 NOTE — Telephone Encounter (Signed)
Note not needed 

## 2017-09-04 ENCOUNTER — Encounter: Payer: Self-pay | Admitting: Family Medicine
